# Patient Record
Sex: Female | Born: 1965 | Race: White | Hispanic: No | Marital: Married | Smoking: Former smoker
Health system: Southern US, Community
[De-identification: ages and names within clinical notes are randomized; demographics above are authoritative.]

## PROBLEM LIST (undated history)

## (undated) DIAGNOSIS — T7840XA Allergy, unspecified, initial encounter: Secondary | ICD-10-CM

## (undated) DIAGNOSIS — G43009 Migraine without aura, not intractable, without status migrainosus: Secondary | ICD-10-CM

## (undated) DIAGNOSIS — Z86010 Personal history of colonic polyps: Principal | ICD-10-CM

## (undated) DIAGNOSIS — G4700 Insomnia, unspecified: Secondary | ICD-10-CM

## (undated) DIAGNOSIS — D649 Anemia, unspecified: Secondary | ICD-10-CM

## (undated) DIAGNOSIS — N259 Disorder resulting from impaired renal tubular function, unspecified: Secondary | ICD-10-CM

## (undated) DIAGNOSIS — K644 Residual hemorrhoidal skin tags: Secondary | ICD-10-CM

## (undated) DIAGNOSIS — G43909 Migraine, unspecified, not intractable, without status migrainosus: Secondary | ICD-10-CM

## (undated) DIAGNOSIS — M1990 Unspecified osteoarthritis, unspecified site: Secondary | ICD-10-CM

## (undated) DIAGNOSIS — H269 Unspecified cataract: Secondary | ICD-10-CM

## (undated) DIAGNOSIS — E785 Hyperlipidemia, unspecified: Secondary | ICD-10-CM

## (undated) DIAGNOSIS — E739 Lactose intolerance, unspecified: Secondary | ICD-10-CM

## (undated) DIAGNOSIS — I10 Essential (primary) hypertension: Secondary | ICD-10-CM

## (undated) DIAGNOSIS — E119 Type 2 diabetes mellitus without complications: Secondary | ICD-10-CM

## (undated) DIAGNOSIS — J309 Allergic rhinitis, unspecified: Secondary | ICD-10-CM

## (undated) DIAGNOSIS — F419 Anxiety disorder, unspecified: Secondary | ICD-10-CM

## (undated) DIAGNOSIS — E669 Obesity, unspecified: Secondary | ICD-10-CM

## (undated) HISTORY — DX: Disorder resulting from impaired renal tubular function, unspecified: N25.9

## (undated) HISTORY — DX: Personal history of colonic polyps: Z86.010

## (undated) HISTORY — DX: Allergic rhinitis, unspecified: J30.9

## (undated) HISTORY — DX: Obesity, unspecified: E66.9

## (undated) HISTORY — DX: Anxiety disorder, unspecified: F41.9

## (undated) HISTORY — DX: Allergy, unspecified, initial encounter: T78.40XA

## (undated) HISTORY — DX: Hypercalcemia: E83.52

## (undated) HISTORY — PX: OTHER SURGICAL HISTORY: SHX169

## (undated) HISTORY — DX: Migraine without aura, not intractable, without status migrainosus: G43.009

## (undated) HISTORY — DX: Type 2 diabetes mellitus without complications: E11.9

## (undated) HISTORY — DX: Hyperlipidemia, unspecified: E78.5

## (undated) HISTORY — DX: Insomnia, unspecified: G47.00

## (undated) HISTORY — DX: Residual hemorrhoidal skin tags: K64.4

## (undated) HISTORY — DX: Unspecified cataract: H26.9

## (undated) HISTORY — DX: Essential (primary) hypertension: I10

## (undated) HISTORY — DX: Lactose intolerance, unspecified: E73.9

---

## 1996-05-09 HISTORY — PX: BREAST SURGERY: SHX581

## 1999-01-07 LAB — HM MAMMOGRAPHY: HM Mammogram: NORMAL

## 2006-04-23 ENCOUNTER — Inpatient Hospital Stay (HOSPITAL_COMMUNITY): Admission: AD | Admit: 2006-04-23 | Discharge: 2006-04-23 | Payer: Self-pay | Admitting: Obstetrics and Gynecology

## 2006-06-26 ENCOUNTER — Inpatient Hospital Stay (HOSPITAL_COMMUNITY): Admission: AD | Admit: 2006-06-26 | Discharge: 2006-06-26 | Payer: Self-pay | Admitting: Obstetrics and Gynecology

## 2006-07-13 ENCOUNTER — Encounter: Admission: RE | Admit: 2006-07-13 | Discharge: 2006-07-13 | Payer: Self-pay | Admitting: Obstetrics and Gynecology

## 2006-08-31 ENCOUNTER — Ambulatory Visit (HOSPITAL_COMMUNITY): Admission: RE | Admit: 2006-08-31 | Discharge: 2006-08-31 | Payer: Self-pay | Admitting: Obstetrics and Gynecology

## 2006-09-01 ENCOUNTER — Inpatient Hospital Stay (HOSPITAL_COMMUNITY): Admission: AD | Admit: 2006-09-01 | Discharge: 2006-09-01 | Payer: Self-pay | Admitting: Obstetrics and Gynecology

## 2006-09-05 ENCOUNTER — Encounter (INDEPENDENT_AMBULATORY_CARE_PROVIDER_SITE_OTHER): Payer: Self-pay | Admitting: Specialist

## 2006-09-05 ENCOUNTER — Inpatient Hospital Stay (HOSPITAL_COMMUNITY): Admission: RE | Admit: 2006-09-05 | Discharge: 2006-09-08 | Payer: Self-pay | Admitting: Obstetrics and Gynecology

## 2006-09-07 HISTORY — PX: OTHER SURGICAL HISTORY: SHX169

## 2006-09-26 ENCOUNTER — Emergency Department (HOSPITAL_COMMUNITY): Admission: EM | Admit: 2006-09-26 | Discharge: 2006-09-26 | Payer: Self-pay | Admitting: Emergency Medicine

## 2006-09-27 ENCOUNTER — Encounter: Payer: Self-pay | Admitting: Neurology

## 2006-09-27 ENCOUNTER — Inpatient Hospital Stay (HOSPITAL_COMMUNITY): Admission: RE | Admit: 2006-09-27 | Discharge: 2006-10-01 | Payer: Self-pay | Admitting: Obstetrics & Gynecology

## 2006-10-02 ENCOUNTER — Emergency Department (HOSPITAL_COMMUNITY): Admission: EM | Admit: 2006-10-02 | Discharge: 2006-10-02 | Payer: Self-pay | Admitting: Emergency Medicine

## 2007-01-04 ENCOUNTER — Encounter: Admission: RE | Admit: 2007-01-04 | Discharge: 2007-01-04 | Payer: Self-pay | Admitting: Nephrology

## 2007-01-04 ENCOUNTER — Encounter: Payer: Self-pay | Admitting: Internal Medicine

## 2007-03-16 ENCOUNTER — Encounter: Admission: RE | Admit: 2007-03-16 | Discharge: 2007-03-16 | Payer: Self-pay | Admitting: Internal Medicine

## 2007-10-25 IMAGING — CR DG CHEST 1V PORT
1 series · 1 of 1 positions shown · non-contrast
Comparison: none

CLINICAL DATA: Shortness of breath.  Postpartum.
 PORTABLE CHEST ? 1 VIEW ? 10/02/06:

[view not recorded]
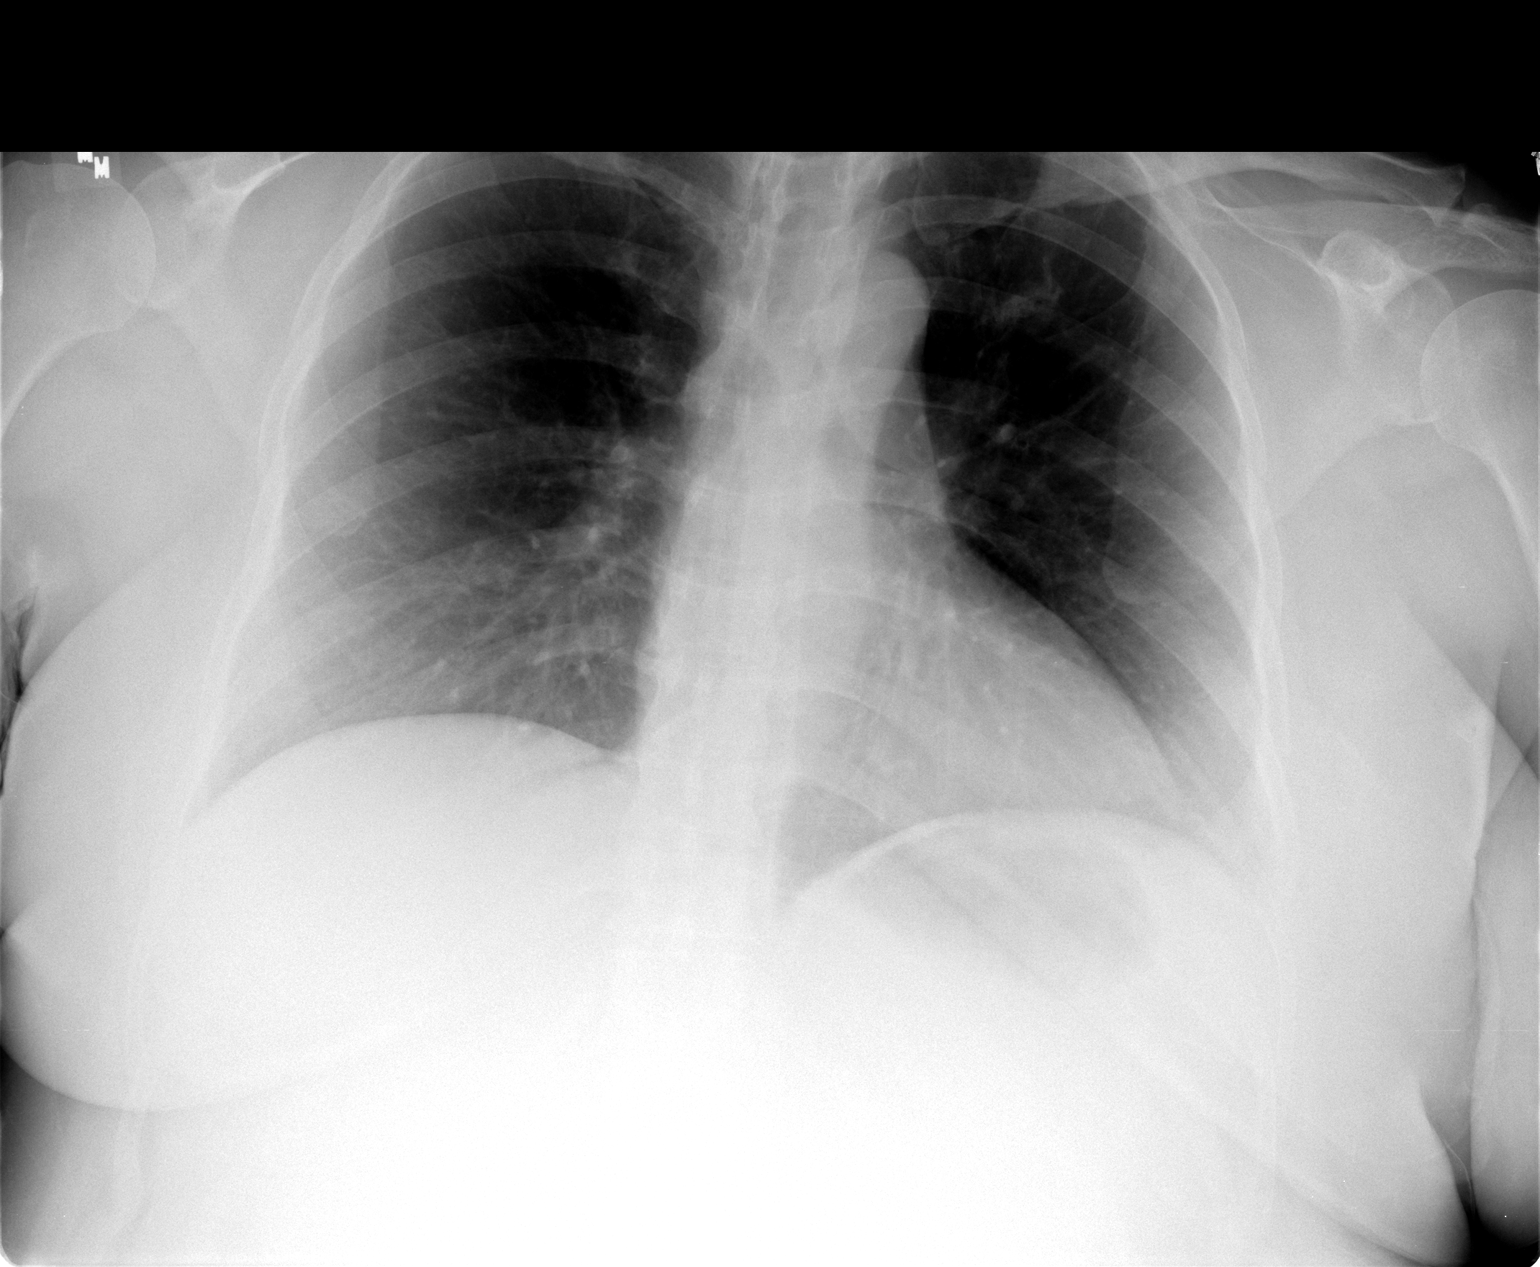

[1 of 1 positions shown; findings below may reference images not displayed]

FINDINGS: Film obtained during low inspiration.  There is no pneumothorax.  Negative for edema or infiltrates.  Density left upper lung, probable overlap of osseous and vascular structures.  Heart is normal in size.  Mediastinum and hila are negative for adenopathy.
IMPRESSION: Low lung volumes without acute cardiac or pulmonary process.

## 2007-11-20 ENCOUNTER — Encounter: Payer: Self-pay | Admitting: Internal Medicine

## 2008-04-17 ENCOUNTER — Ambulatory Visit: Payer: Self-pay | Admitting: *Deleted

## 2008-04-17 DIAGNOSIS — E1169 Type 2 diabetes mellitus with other specified complication: Secondary | ICD-10-CM | POA: Insufficient documentation

## 2008-04-17 DIAGNOSIS — E785 Hyperlipidemia, unspecified: Secondary | ICD-10-CM | POA: Insufficient documentation

## 2008-04-17 DIAGNOSIS — J309 Allergic rhinitis, unspecified: Secondary | ICD-10-CM | POA: Insufficient documentation

## 2008-04-17 DIAGNOSIS — I10 Essential (primary) hypertension: Secondary | ICD-10-CM | POA: Insufficient documentation

## 2008-04-17 DIAGNOSIS — E1159 Type 2 diabetes mellitus with other circulatory complications: Secondary | ICD-10-CM | POA: Insufficient documentation

## 2008-04-17 DIAGNOSIS — G43011 Migraine without aura, intractable, with status migrainosus: Secondary | ICD-10-CM | POA: Insufficient documentation

## 2008-04-17 DIAGNOSIS — G43009 Migraine without aura, not intractable, without status migrainosus: Secondary | ICD-10-CM

## 2008-04-17 DIAGNOSIS — J209 Acute bronchitis, unspecified: Secondary | ICD-10-CM | POA: Insufficient documentation

## 2008-05-09 LAB — CONVERTED CEMR LAB

## 2008-05-19 ENCOUNTER — Ambulatory Visit: Payer: Self-pay | Admitting: *Deleted

## 2008-05-19 DIAGNOSIS — E669 Obesity, unspecified: Secondary | ICD-10-CM | POA: Insufficient documentation

## 2008-05-19 DIAGNOSIS — E739 Lactose intolerance, unspecified: Secondary | ICD-10-CM | POA: Insufficient documentation

## 2008-05-19 DIAGNOSIS — N189 Chronic kidney disease, unspecified: Secondary | ICD-10-CM | POA: Insufficient documentation

## 2008-05-19 LAB — CONVERTED CEMR LAB
ALT: 22 units/L (ref 0–35)
Albumin: 4 g/dL (ref 3.5–5.2)
Alkaline Phosphatase: 45 units/L (ref 39–117)
Basophils Absolute: 0.1 10*3/uL (ref 0.0–0.1)
CO2: 29 meq/L (ref 19–32)
Cholesterol: 156 mg/dL (ref 0–200)
GFR calc Af Amer: 58 mL/min
HDL: 33.5 mg/dL — ABNORMAL LOW (ref >39.0)
Hemoglobin: 14.2 g/dL (ref 12.0–15.0)
LDL Cholesterol: 93 mg/dL (ref 0–99)
Lymphocytes Relative: 27.4 % (ref 12.0–46.0)
MCHC: 34.8 g/dL (ref 30.0–36.0)
Monocytes Relative: 11 % (ref 3.0–12.0)
Neutro Abs: 3.4 10*3/uL (ref 1.4–7.7)
Platelets: 222 10*3/uL (ref 150–400)
Potassium: 3.7 meq/L (ref 3.5–5.1)
RDW: 12 % (ref 11.5–14.6)
Sodium: 137 meq/L (ref 135–145)
Total Bilirubin: 0.8 mg/dL (ref 0.3–1.2)
Total CHOL/HDL Ratio: 4.7
Total Protein: 7.2 g/dL (ref 6.0–8.3)
Triglycerides: 148 mg/dL (ref 0–149)
VLDL: 30 mg/dL (ref 0–40)

## 2008-05-27 ENCOUNTER — Encounter (INDEPENDENT_AMBULATORY_CARE_PROVIDER_SITE_OTHER): Payer: Self-pay | Admitting: *Deleted

## 2008-07-11 ENCOUNTER — Telehealth (INDEPENDENT_AMBULATORY_CARE_PROVIDER_SITE_OTHER): Payer: Self-pay | Admitting: *Deleted

## 2008-07-17 ENCOUNTER — Ambulatory Visit: Payer: Self-pay | Admitting: *Deleted

## 2008-08-19 ENCOUNTER — Ambulatory Visit: Payer: Self-pay | Admitting: Family Medicine

## 2008-09-02 ENCOUNTER — Ambulatory Visit: Payer: Self-pay | Admitting: Family Medicine

## 2008-09-02 DIAGNOSIS — T887XXA Unspecified adverse effect of drug or medicament, initial encounter: Secondary | ICD-10-CM | POA: Insufficient documentation

## 2008-09-12 ENCOUNTER — Telehealth (INDEPENDENT_AMBULATORY_CARE_PROVIDER_SITE_OTHER): Payer: Self-pay | Admitting: *Deleted

## 2008-09-16 ENCOUNTER — Ambulatory Visit: Payer: Self-pay | Admitting: Family Medicine

## 2008-09-16 ENCOUNTER — Telehealth: Payer: Self-pay | Admitting: Family Medicine

## 2008-09-16 LAB — CONVERTED CEMR LAB
BUN: 19 mg/dL (ref 6–23)
Chloride: 102 meq/L (ref 96–112)
Creatinine, Ser: 1.3 mg/dL — ABNORMAL HIGH (ref 0.4–1.2)
Glucose, Bld: 92 mg/dL (ref 70–99)
Potassium: 3.8 meq/L (ref 3.5–5.1)

## 2008-11-25 ENCOUNTER — Ambulatory Visit: Payer: Self-pay | Admitting: Family Medicine

## 2008-12-08 ENCOUNTER — Telehealth (INDEPENDENT_AMBULATORY_CARE_PROVIDER_SITE_OTHER): Payer: Self-pay | Admitting: *Deleted

## 2009-06-02 ENCOUNTER — Ambulatory Visit: Payer: Self-pay | Admitting: Family

## 2009-06-02 DIAGNOSIS — G4700 Insomnia, unspecified: Secondary | ICD-10-CM | POA: Insufficient documentation

## 2009-06-03 ENCOUNTER — Ambulatory Visit: Payer: Self-pay | Admitting: Family

## 2009-06-03 LAB — CONVERTED CEMR LAB
Albumin: 4.4 g/dL (ref 3.5–5.2)
CO2: 29 meq/L (ref 19–32)
Calcium: 9.5 mg/dL (ref 8.4–10.5)
Cholesterol: 163 mg/dL (ref 0–200)
Creatinine, Ser: 1.2 mg/dL (ref 0.4–1.2)
Glucose, Bld: 73 mg/dL (ref 70–99)
HDL: 41.4 mg/dL (ref >39.00)
TSH: 2 microintl units/mL (ref 0.35–5.50)
Total Protein: 7.4 g/dL (ref 6.0–8.3)
Triglycerides: 198 mg/dL — ABNORMAL HIGH (ref 0.0–149.0)

## 2009-06-12 ENCOUNTER — Encounter: Payer: Self-pay | Admitting: Family

## 2009-07-22 ENCOUNTER — Ambulatory Visit: Payer: Self-pay | Admitting: Family

## 2009-07-22 LAB — CONVERTED CEMR LAB
BUN: 10 mg/dL (ref 6–23)
CO2: 31 meq/L (ref 19–32)
Chloride: 106 meq/L (ref 96–112)
Glucose, Bld: 98 mg/dL (ref 70–99)
Potassium: 3.8 meq/L (ref 3.5–5.1)

## 2009-11-24 ENCOUNTER — Encounter: Payer: Self-pay | Admitting: Internal Medicine

## 2009-11-25 ENCOUNTER — Ambulatory Visit: Payer: Self-pay | Admitting: Internal Medicine

## 2009-12-04 ENCOUNTER — Ambulatory Visit (HOSPITAL_COMMUNITY): Admission: RE | Admit: 2009-12-04 | Discharge: 2009-12-04 | Payer: Self-pay | Admitting: Obstetrics and Gynecology

## 2009-12-07 DEATH — deceased

## 2010-02-24 ENCOUNTER — Ambulatory Visit: Payer: Self-pay | Admitting: Internal Medicine

## 2010-02-24 DIAGNOSIS — L03119 Cellulitis of unspecified part of limb: Secondary | ICD-10-CM

## 2010-02-24 DIAGNOSIS — L02419 Cutaneous abscess of limb, unspecified: Secondary | ICD-10-CM | POA: Insufficient documentation

## 2010-02-25 LAB — CONVERTED CEMR LAB
HDL: 36.6 mg/dL — ABNORMAL LOW (ref >39.00)
VLDL: 45.8 mg/dL — ABNORMAL HIGH (ref 0.0–40.0)

## 2010-06-08 NOTE — Assessment & Plan Note (Signed)
Summary: one mth fu/kdc   Vital Signs:  Patient profile:   45 year old female Weight:      204 pounds BMI:     35.14 O2 Sat:      94 % on Room air Temp:     98 degrees F oral Pulse rate:   78 / minute Pulse rhythm:   regular Resp:     16 per minute BP sitting:   100 / 76  (left arm) Cuff size:   large  Vitals Entered By: Mervin Kung CMA (July 22, 2009 8:28 AM)  O2 Flow:  Room air CC: ROOM 17  1 month follow up Is Patient Diabetic? No Comments Needs refills on all prescriptions.   Primary Care Provider:  Seymour Bars DO  CC:  ROOM 17  1 month follow up.  History of Present Illness: [name] is a 45 yr old female who presents today for follow up of her HTN and Hyperlipidemia. Last visit her norvasc was held.  Patient was monitoring BP at home and noted that her diastolic BP was staying in the 90's.  She decided to resume Norvasc about 2.5 weeks ago.  Tells me that she feels, "a lot better when I am on it (norvasc)."  Denies LE swelling or headache.    Preventive Screening-Counseling & Management  Alcohol-Tobacco     Smoking Status: quit  Allergies: 1)  ! Avapro (Irbesartan) 2)  ! Benicar (Olmesartan Medoxomil) 3)  Lisinopril  Physical Exam  General:  Well-developed,well-nourished,in no acute distress; alert,appropriate and cooperative throughout examination Lungs:  Normal respiratory effort, chest expands symmetrically. Lungs are clear to auscultation, no crackles or wheezes. Heart:  Normal rate and regular rhythm. S1 and S2 normal without gallop, murmur, click, rub or other extra sounds.   Impression & Recommendations:  Problem # 1:  HYPERTENSION (ICD-401.9) Assessment New BP stable, back on norvasc, continue same.  Check BMET. Her updated medication list for this problem includes:    Labetalol Hcl 200 Mg Tabs (Labetalol hcl) .Marland Kitchen..Marland Kitchen Two  tablet three times daily    Triamterene-hctz 37.5-25 Mg Caps (Triamterene-hctz) .Marland Kitchen... Take 1 tablet by mouth once a  day    Norvasc 5 Mg Tabs (Amlodipine besylate) .Marland Kitchen... Take 1 tablet by mouth once a day  Orders: TLB-BMP (Basic Metabolic Panel-BMET) (80048-METABOL)  Problem # 2:  HYPERLIPIDEMIA (ICD-272.4) Assessment: Comment Only Last visit triglycerides were elevated,  continue tricor, advised patient on diet.   Her updated medication list for this problem includes:    Tricor 145 Mg Tabs (Fenofibrate) .Marland Kitchen... Take 1 tablet by mouth once a day    Lipitor 20 Mg Tabs (Atorvastatin calcium) .Marland Kitchen... Take 1 tablet by mouth once a day  Complete Medication List: 1)  Labetalol Hcl 200 Mg Tabs (Labetalol hcl) .... Two  tablet three times daily 2)  Tricor 145 Mg Tabs (Fenofibrate) .... Take 1 tablet by mouth once a day 3)  Lipitor 20 Mg Tabs (Atorvastatin calcium) .... Take 1 tablet by mouth once a day 4)  Triamterene-hctz 37.5-25 Mg Caps (Triamterene-hctz) .... Take 1 tablet by mouth once a day 5)  Centrum Tabs (Multiple vitamins-minerals) .... Take 1 tablet by mouth once a day 6)  Alavert 10 Mg Tabs (Loratadine) .... One tab by mouth once daily as needed 7)  Aler-tab 25 Mg Tabs (Diphenhydramine hcl) .... Take 1 tablet by mouth once a day as needed 8)  Arthritis Pain Relief 650 Mg Cr-tabs (Acetaminophen) .... Take 1 tablet by mouth once a  day as needed 9)  Fish Oil 1000 Mg Caps (Omega-3 fatty acids) .... Take 1 tablet by mouth once a day 10)  Alph-e 400 Unit Caps (Vitamin e) .... Take 1 tablet by mouth once a day 11)  Vitamin C 1000 Mg Tabs (Ascorbic acid) .... Take 1 tablet by mouth once a day 12)  Calcium 600/vitamin D 600-400 Mg-unit Chew (Calcium carbonate-vitamin d) .... Take 1 tablet by mouth daily 13)  Amitriptyline Hcl 25 Mg Tabs (Amitriptyline hcl) .... One tablet by mouth at bedtime as needed insomnia 14)  Norvasc 5 Mg Tabs (Amlodipine besylate) .... Take 1 tablet by mouth once a day  Patient Instructions: 1)  Your triglycerides are high.  Please make the following nutritional changes: 2)  1.  Avoid  white bread, white pasta and white rice 3)  2.  Avoid high fructose corn syrup 4)  3.  Instead eat brown carbs- Yams, Wheat pasta, whole grained breads, wild rice. 5)  Limit your sugars to less than 40 grams a day from labeled foods and drinks. 6)  Return in 3 months, sooner if problems or concerns. Prescriptions: NORVASC 5 MG TABS (AMLODIPINE BESYLATE) Take 1 tablet by mouth once a day  #30 x 2   Entered by:   Mervin Kung CMA   Authorized by:   Lemont Fillers FNP   Signed by:   Lemont Fillers FNP on 07/22/2009   Method used:   [facility]        [facility] [address] [phone]* (retail)       [address]       [place], [place]  [address]       Ph: [phone] or [phone]       Fax: [phone]   RxID:   [id number] AMITRIPTYLINE HCL 25 MG TABS (AMITRIPTYLINE HCL) one tablet by mouth at bedtime as needed insomnia  #30 x 2   Entered by:   Mervin Kung CMA   Authorized by:   Lemont Fillers FNP   Signed by:   Lemont Fillers FNP on 07/22/2009   Method used:   [facility]        [facility] [address] [phone]* (retail)       [address]       [place], [place]  [address]       Ph: [phone] or [phone]       Fax: [phone]   RxID:   [id number] TRIAMTERENE-HCTZ 37.5-25 MG CAPS (TRIAMTERENE-HCTZ) Take 1 tablet by mouth once a day  #30 x 2   Entered by:   Mervin Kung CMA   Authorized by:   Lemont Fillers FNP   Signed by:   Lemont Fillers FNP on 07/22/2009   Method used:   [facility]ronically to        [facility] [address] [phone]* (retail)       [address]       [place], [place]  [address]       Ph: [phone] or [phone]       Fax: [phone]   RxID:   [id number] LIPITOR 20 MG TABS (ATORVASTATIN CALCIUM) Take 1 tablet by mouth once a day  #30 x 2   Entered by:   Mervin Kung CMA   Authorized by:   Lemont Fillers  FNP  Signed by:   Lemont Fillers FNP on 07/22/2009   Method used:   [facility]ronically to        [facility] [address] [phone]* (retail)       [address]       [place], [place]  [address]       Ph: [phone] or [phone]       Fax: [phone]   RxID:   [id number] TRICOR 145 MG TABS (FENOFIBRATE) Take 1 tablet by mouth once a day  #30 x 2   Entered by:   Mervin Kung CMA   Authorized by:   Lemont Fillers FNP   Signed by:   Lemont Fillers FNP on 07/22/2009   Method used:   [facility]ronically to        [facility] [address] [phone]* (retail)       [address]       [place], [place]  [address]       Ph: [phone] or [phone]       Fax: [phone]   RxID:   [id number] LABETALOL HCL 200 MG TABS (LABETALOL HCL) two  tablet three times daily  #180 x 2   Entered by:   Mervin Kung CMA   Authorized by:   Lemont Fillers FNP   Signed by:   Lemont Fillers FNP on 07/22/2009   Method used:   [facility]ronically to        [facility] [address] [phone]* (retail)       [address]       [place], [place]  [address]       Ph: [phone] or [phone]       Fax: [phone]   RxID:   [id number]   Current Allergies (reviewed today): ! AVAPRO (IRBESARTAN) ! BENICAR (OLMESARTAN MEDOXOMIL) LISINOPRIL

## 2010-06-08 NOTE — Assessment & Plan Note (Signed)
Summary: F/U/HEA   Vital Signs:  Patient profile:   45 year old female Height:      64 inches (162.56 cm) Weight:      199.13 pounds (90.51 kg) Pulse rate:   78 / minute BP sitting:   100 / 72  Vitals Entered By: [name] (June 02, 2009 8:11 AM) CC: 6 mos followup refill on meds   Primary Care Provider:  Seymour Bars DO  CC:  6 mos followup refill on meds.  History of Present Illness: [name] is a 45 year old female who presents today for a 6 month follow up.  Has been taking medicines as prescribed.  Patient notes that she had a miscarraige 2 weeks ago at [redacted] weeks gestation which she has been upset about.  Also- notes intermittent insomnia, which has been worse since recent event.  Has tried benadryl without improvement.    Allergies: 1)  ! Avapro (Irbesartan) 2)  ! Benicar (Olmesartan Medoxomil) 3)  Lisinopril  Review of Systems       notes + weight gain due to recent miscarraige,  + insomnia, denies anxiety or depression.    Physical Exam  General:  Well-developed,well-nourished,in no acute distress; alert,appropriate and cooperative throughout examination Head:  Normocephalic and atraumatic without obvious abnormalities. No apparent alopecia or balding. Lungs:  Normal respiratory effort, chest expands symmetrically. Lungs are clear to auscultation, no crackles or wheezes. Heart:  Normal rate and regular rhythm. S1 and S2 normal without gallop, murmur, click, rub or other extra sounds.   Impression & Recommendations:  Problem # 1:  HYPERTENSION (ICD-401.9) Assessment Comment Only  Patient appears to be overtreated at this time, will hold norvasc- patient instructed to check BP at home, parameters provided for patient to call.   The following medications were removed from the medication list:    Norvasc 5 Mg Tabs (Amlodipine besylate) .Marland Kitchen... 1 tab by mouth daily Her updated medication list for this problem includes:    Labetalol Hcl 200 Mg Tabs (Labetalol  hcl) .Marland Kitchen..Marland Kitchen Two  tablet three times daily    Triamterene-hctz 37.5-25 Mg Caps (Triamterene-hctz) .Marland Kitchen... Take 1 tablet by mouth once a day  BP today: 100/72 Prior BP: 116/82 (11/25/2008)  Prior 10 Yr Risk Heart Disease: 2 % (08/19/2008)  Labs Reviewed: K+: 3.8 (09/16/2008) Creat: : 1.3 (09/16/2008)   Chol: 156 (05/19/2008)   HDL: 33.5 (05/19/2008)   LDL: 93 (05/19/2008)   TG: 148 (05/19/2008)  Problem # 2:  HYPERLIPIDEMIA (ICD-272.4) Will check FLP, LFT's- patient hopes to be able to come off of these medicines Her updated medication list for this problem includes:    Tricor 145 Mg Tabs (Fenofibrate) .Marland Kitchen... Take 1 tablet by mouth once a day    Lipitor 20 Mg Tabs (Atorvastatin calcium) .Marland Kitchen... Take 1 tablet by mouth once a day  Problem # 3:  INSOMNIA (ICD-780.52) Will give trial of elevil, patient is trying to avoid ambien.    Problem # 4:  GLUCOSE INTOLERANCE (ICD-271.3) Assessment: Comment Only Will check fasing glucose and A1C  Complete Medication List: 1)  Labetalol Hcl 200 Mg Tabs (Labetalol hcl) .... Two  tablet three times daily 2)  Tricor 145 Mg Tabs (Fenofibrate) .... Take 1 tablet by mouth once a day 3)  Lipitor 20 Mg Tabs (Atorvastatin calcium) .... Take 1 tablet by mouth once a day 4)  Triamterene-hctz 37.5-25 Mg Caps (Triamterene-hctz) .... Take 1 tablet by mouth once a day 5)  Centrum Tabs (Multiple vitamins-minerals) .... Take  1 tablet by mouth once a day 6)  Alavert 10 Mg Tabs (Loratadine) .... One tab by mouth once daily as needed 7)  Aler-tab 25 Mg Tabs (Diphenhydramine hcl) .... Take 1 tablet by mouth once a day as needed 8)  Arthritis Pain Relief 650 Mg Cr-tabs (Acetaminophen) .... Take 1 tablet by mouth once a day as needed 9)  Fish Oil 1000 Mg Caps (Omega-3 fatty acids) .... Take 1 tablet by mouth once a day 10)  Alph-e 400 Unit Caps (Vitamin e) .... Take 1 tablet by mouth once a day 11)  Vitamin C 1000 Mg Tabs (Ascorbic acid) .... Take 1 tablet by mouth once a  day 12)  Calcium 600/vitamin D 600-400 Mg-unit Chew (Calcium carbonate-vitamin d) .... Take 1 tablet by mouth two times a day 13)  Amitriptyline Hcl 25 Mg Tabs (Amitriptyline hcl) .... One tablet by mouth by mouth at bedtime as needed insomnia  Patient Instructions: 1)  Please return fasting for the following labs: 2)  BMET (401.9) 3)  LFT's, FLP(272.4) 4)  TSH (278) 5)  A1C (271.3) 6)  Please check your blood pressure at home.  Call if your blood pressure is greater than 150/90 or less than 100/60. 7)  Follow up in 1 month. 8)  Follow up with GYN for your Pap and Mammogram. 9)  It was a pleasure to meet you 10)    Prescriptions: TRIAMTERENE-HCTZ 37.5-25 MG CAPS (TRIAMTERENE-HCTZ) Take 1 tablet by mouth once a day  #30 x 2   Entered and Authorized by:   Lemont Fillers FNP   Signed by:   Lemont Fillers FNP on 06/02/2009   Method used:   [facility]ron[facility] to        [facility] [address] [phone]* (retail)       [address]       [place], [place]  [address]       Ph: [phone] or [phone]       Fax: [phone]   RxID:   [id number] LIPITOR 20 MG TABS (ATORVASTATIN CALCIUM) Take 1 tablet by mouth once a day  #30 x 2   Entered and Authorized by:   Lemont Fillers FNP   Signed by:   Lemont Fillers FNP on 06/02/2009   Method used:   Electronically to        [facility] [address] [phone]* (retail)       [address]       [place], [place]  [address]       Ph: [phone] or [phone]       Fax: [phone]   RxID:   [id number] TRICOR 145 MG TABS (FENOFIBRATE) Take 1 tablet by mouth once a day  #30 x 2   Entered and Authorized by:   Lemont Fillers FNP   Signed by:   Lemont Fillers FNP on 06/02/2009   Method used:   Electronically to        [facility] [address] [phone]* (retail)       [address]       [place], [place]  [address]       Ph: [phone] or  [phone]       Fax: [phone]   RxID:   [id number] LABETALOL HCL 200 MG TABS (LABETALOL HCL) two  tablet three times daily  #  180 x 2   Entered and Authorized by:   Lemont Fillers FNP   Signed by:   Lemont Fillers FNP on 06/02/2009   Method used:   Electronically to        [facility] [address] [phone]* (retail)       [address]       [place], [place]  [address]       Ph: [phone] or [phone]       Fax: [phone]   RxID:   [id number] AMITRIPTYLINE HCL 25 MG TABS (AMITRIPTYLINE HCL) one tablet by mouth by mouth at bedtime as needed insomnia  #30 x 0   Entered and Authorized by:   Lemont Fillers FNP   Signed by:   Lemont Fillers FNP on 06/02/2009   Method used:   Electronically to        [facility] [address] [phone]* (retail)       [address]       [place], [place]  [address]       Ph: [phone] or [phone]       Fax: [phone]   RxID:   [id number]

## 2010-06-08 NOTE — Letter (Signed)
Summary: Date Range: 10-11-06 to 11-24-09/[facility]  Date Range: 10-11-06 to 11-24-09/[facility]   Imported By: Sherian Rein 12/02/2009 10:19:16  _____________________________________________________________________  External Attachment:    Type:   Image     Comment:   External Document

## 2010-06-08 NOTE — Assessment & Plan Note (Signed)
Summary: 3-6 mth fu---stc   Vital Signs:  Patient profile:   45 year old female Height:      64 inches (162.56 cm) Weight:      208.0 pounds (94.55 kg) O2 Sat:      97 % on Room air Temp:     97.7 degrees F (36.50 degrees C) oral Pulse rate:   79 / minute BP sitting:   110 / 76  (left arm) Cuff size:   large  Vitals Entered By: Orlan Leavens RMA (February 24, 2010 9:10 AM)  O2 Flow:  Room air CC: 3 month follow-up Is Patient Diabetic? No Pain Assessment Patient in pain? no        Primary Care Provider:  Newt Lukes MD  CC:  3 month follow-up.  History of Present Illness: here for followup -  1) HTN - has been on labetolol with diuretic and norvasc - hx severe HTN in past, no secondary cause identified - follows with renal for same - reports compliance with ongoing medical treatment and no changes in medication dose or frequency. denies adverse side effects related to current therapy.   2) dyslipidemia - resumed lipitor 12/2009 after miscarriage - reports compliance with ongoing medical treatment - no changes in medication dose or frequency. denies adverse side effects related to current therapy.   3) glucose intolerance with hx gestational DM 2008 -not on meds for same since childbirth 2008 - follows low carb diet and exercise in effort to control weight - +FH same -  Clinical Review Panels:  Immunizations   Last Tetanus Booster:  Tdap (05/19/2008)  Lipid Management   Cholesterol:  163 (06/03/2009)   LDL (bad choesterol):  82 (06/03/2009)   HDL (good cholesterol):  41.40 (06/03/2009)  Diabetes Management   HgBA1C:  5.6 (06/03/2009)   Creatinine:  1.2 (07/22/2009)  CBC   WBC:  5.9 (05/19/2008)   RBC:  4.46 (05/19/2008)   Hgb:  14.2 (05/19/2008)   Hct:  40.9 (05/19/2008)   Platelets:  222 (05/19/2008)   MCV  91.6 (05/19/2008)   MCHC  34.8 (05/19/2008)   RDW  12.0 (05/19/2008)   PMN:  57.5 (05/19/2008)   Lymphs:  27.4 (05/19/2008)   Monos:  11.0  (05/19/2008)   Eosinophils:  3.2 (05/19/2008)   Basophil:  0.9 (05/19/2008)  Complete Metabolic Panel   Glucose:  98 (07/22/2009)   Sodium:  141 (07/22/2009)   Potassium:  3.8 (07/22/2009)   Chloride:  106 (07/22/2009)   CO2:  31 (07/22/2009)   BUN:  10 (07/22/2009)   Creatinine:  1.2 (07/22/2009)   Albumin:  4.4 (06/03/2009)   Total Protein:  7.4 (06/03/2009)   Calcium:  9.5 (07/22/2009)   Total Bili:  0.8 (06/03/2009)   Alk Phos:  45 (06/03/2009)   SGPT (ALT):  24 (06/03/2009)   SGOT (AST):  24 (06/03/2009)   Current Medications (verified): 1)  Labetalol Hcl 200 Mg Tabs (Labetalol Hcl) .... Two  Tablet Three Times Daily 2)  Tricor 145 Mg Tabs (Fenofibrate) .... Take 1 Tablet By Mouth Once A Day 3)  Lipitor 20 Mg Tabs (Atorvastatin Calcium) .... Take 1 Tablet By Mouth Once A Day 4)  Triamterene-Hctz 37.5-25 Mg Caps (Triamterene-Hctz) .... Take 1 Tablet By Mouth Once A Day 5)  Centrum  Tabs (Multiple Vitamins-Minerals) .... Take 1 Tablet By Mouth Once A Day 6)  Alavert 10 Mg Tabs (Loratadine) .... One Tab By Mouth Once Daily As Needed 7)  Aler-Tab 25 Mg Tabs (Diphenhydramine  Hcl) .... Take 1 Tablet By Mouth Once A Day As Needed 8)  Arthritis Pain Relief 650 Mg Cr-Tabs (Acetaminophen) .... Take 1 Tablet By Mouth Once A Day As Needed 9)  Fish Oil 1000 Mg Caps (Omega-3 Fatty Acids) .... Take 1 Tablet By Mouth Once A Day 10)  Alph-E 400 Unit Caps (Vitamin E) .... Take 1 Tablet By Mouth Once A Day 11)  Vitamin C 1000 Mg Tabs (Ascorbic Acid) .... Take 1 Tablet By Mouth Once A Day 12)  Calcium 600/vitamin D 600-400 Mg-Unit Chew (Calcium Carbonate-Vitamin D) .... Take 1 Tablet By Mouth Daily 13)  Amitriptyline Hcl 25 Mg Tabs (Amitriptyline Hcl) .... One Tablet By Mouth At Bedtime As Needed Insomnia 14)  Norvasc 5 Mg Tabs (Amlodipine Besylate) .... Take 1 Tablet By Mouth Once A Day  Allergies (verified): 1)  ! Avapro (Irbesartan) 2)  ! Benicar (Olmesartan Medoxomil) 3)   Lisinopril  Past History:  Past Medical History: gestational diabetes hx - 2008 frequent headaches/migraines Hyperlipidemia Hypertension  Allergic rhinitis renal insuffic  MD roster:  renal - [facility] - [facility]  Review of Systems  The patient denies chest pain, syncope, peripheral edema, and headaches.         c/o sore "bite" with draiange x 5 days on back of left lower calf, no fever  Physical Exam  General:  alert, well-developed, well-nourished, and cooperative to examination.  mild-mod overweight-appearing.   Lungs:  normal respiratory effort, no intercostal retractions or use of accessory muscles; normal breath sounds bilaterally - no crackles and no wheezes.    Heart:  normal rate, regular rhythm, no murmur, and no rub. BLE without edema. Skin:  mild cellulitis surrounding insect bite, scabbed, no fluctuence or active drainage Psych:  Oriented X3, memory intact for recent and remote, normally interactive, good eye contact, not anxious appearing, not depressed appearing, and not agitated.      Impression & Recommendations:  Problem # 1:  HYPERLIPIDEMIA (ICD-272.4) back on lipitor since 12/2009 - check FLP now  Her updated medication list for this problem includes:    Tricor 145 Mg Tabs (Fenofibrate) .Marland Kitchen... Take 1 tablet by mouth once a day    Lipitor 20 Mg Tabs (Atorvastatin calcium) .Marland Kitchen... Take 1 tablet by mouth once a day  Orders: TLB-Lipid Panel (80061-LIPID)  Labs Reviewed: SGOT: 24 (06/03/2009)   SGPT: 24 (06/03/2009)  Prior 10 Yr Risk Heart Disease: 2 % (08/19/2008)   HDL:41.40 (06/03/2009), 33.5 (05/19/2008)  LDL:82 (06/03/2009), 93 (05/19/2008)  Chol:163 (06/03/2009), 156 (05/19/2008)  Trig:198.0 (06/03/2009), 148 (05/19/2008)  Problem # 2:  HYPERTENSION (ICD-401.9)  Her updated medication list for this problem includes:    Labetalol Hcl 200 Mg Tabs (Labetalol hcl) .Marland Kitchen..Marland Kitchen Two  tablet three times daily    Triamterene-hctz 37.5-25 Mg Caps  (Triamterene-hctz) .Marland Kitchen... Take 1 tablet by mouth once a day    Norvasc 5 Mg Tabs (Amlodipine besylate) .Marland Kitchen... Take 1 tablet by mouth once a day  hx severe dz - improved today - renal OV annually - last OV reviewed  BP today: 110/76 Prior BP: 110/78 (11/25/2009)  Prior 10 Yr Risk Heart Disease: 2 % (08/19/2008)  Labs Reviewed: K+: 3.8 (07/22/2009) Creat: : 1.2 (07/22/2009)   Chol: 163 (06/03/2009)   HDL: 41.40 (06/03/2009)   LDL: 82 (06/03/2009)   TG: 198.0 (06/03/2009)  Problem # 3:  CELLULITIS, LEG, LEFT (ICD-682.6)  small painful red area at site of probable insect bite - cover with emepric doxy - erx done Her updated  medication list for this problem includes:    Doxycycline Hyclate 100 Mg Tabs (Doxycycline hyclate) .Marland Kitchen... 1 by mouth two times a day x 7 days  Orders: Prescription Created Electronically [phone])  Elevate affected area. Warm moist compresses for 20 minutes every 2 hours while awake. Take antibiotics as directed and take acetaminophen as needed. To be seen in 48-72 hours if no improvement, sooner if worse.  Problem # 4:  INSOMNIA (ICD-780.52) chronic, not improved with low dose amitriptylibe (on same as needed since 05/2009) as pt does not use every night, will try low dose ambien as needed  Her updated medication list for this problem includes:    Ambien 5 Mg Tabs (Zolpidem tartrate) .Marland Kitchen... 1 by mouth at bedtime as needed for insomnia  Complete Medication List: 1)  Labetalol Hcl 200 Mg Tabs (Labetalol hcl) .... Two  tablet three times daily 2)  Tricor 145 Mg Tabs (Fenofibrate) .... Take 1 tablet by mouth once a day 3)  Lipitor 20 Mg Tabs (Atorvastatin calcium) .... Take 1 tablet by mouth once a day 4)  Triamterene-hctz 37.5-25 Mg Caps (Triamterene-hctz) .... Take 1 tablet by mouth once a day 5)  Centrum Tabs (Multiple vitamins-minerals) .... Take 1 tablet by mouth once a day 6)  Alavert 10 Mg Tabs (Loratadine) .... One tab by mouth once daily as needed 7)  Aler-tab  25 Mg Tabs (Diphenhydramine hcl) .... Take 1 tablet by mouth once a day as needed 8)  Arthritis Pain Relief 650 Mg Cr-tabs (Acetaminophen) .... Take 1 tablet by mouth once a day as needed 9)  Fish Oil 1000 Mg Caps (Omega-3 fatty acids) .... Take 1 tablet by mouth once a day 10)  Alph-e 400 Unit Caps (Vitamin e) .... Take 1 tablet by mouth once a day 11)  Vitamin C 1000 Mg Tabs (Ascorbic acid) .... Take 1 tablet by mouth once a day 12)  Calcium 600/vitamin D 600-400 Mg-unit Chew (Calcium carbonate-vitamin d) .... Take 1 tablet by mouth daily 13)  Amitriptyline Hcl 25 Mg Tabs (Amitriptyline hcl) .... One tablet by mouth at bedtime as needed insomnia 14)  Norvasc 5 Mg Tabs (Amlodipine besylate) .... Take 1 tablet by mouth once a day 15)  Doxycycline Hyclate 100 Mg Tabs (Doxycycline hyclate) .Marland Kitchen.. 1 by mouth two times a day x 7 days 16)  Ambien 5 Mg Tabs (Zolpidem tartrate) .Marland Kitchen.. 1 by mouth at bedtime as needed for insomnia  Patient Instructions: 1)  it was good to see you today. 2)  test(s) ordered today - your results will be posted on the phone tree for review in 48-72 hours from the time of test completion; call [phone] and enter your 9 digit MRN (listed above on this page, just below your name); if any changes need to be made or there are abnormal results, you will be contacted directly. 3)  start doxycycline for leg sore and try ambien for sleep as discussed - your prescriptions have been electronically submitted to your pharmacy. Please take as directed. Contact our office if you believe you're having problems with the medication(s).  4)  Please schedule a follow-up appointment in 6 months, call sooner if problems. will check labs including cholesterol at that visit and monitor blood pressure Prescriptions: AMBIEN 5 MG TABS (ZOLPIDEM TARTRATE) 1 by mouth at bedtime as needed for insomnia  #30 x 1   Entered and Authorized by:   Newt Lukes MD   Signed by:   Newt Lukes MD on  02/24/2010   Method used:   Printed then faxed to ...       [facility] [address] [phone]* (retail)       [address]       [place], [place]  [address]       Ph: [phone] or [phone]       Fax: [phone]   RxID:   [id number] DOXYCYCLINE HYCLATE 100 MG TABS (DOXYCYCLINE HYCLATE) 1 by mouth two times a day x 7 days  #14 x 0   Entered and Authorized by:   Newt Lukes MD   Signed by:   Newt Lukes MD on 02/24/2010   Method used:   [facility]ronically to        [facility] [address] [phone]* (retail)       [address]       [place], [place]  [address]       Ph: [phone] or [phone]       Fax: [phone]   RxID:   [id number]    Orders Added: 1)  Est. Patient Level IV [02585] 2)  TLB-Lipid Panel [80061-LIPID] 3)  Prescription Created Electronically [phone]

## 2010-06-08 NOTE — Letter (Signed)
   [place] [facility] [address] [place], [place]  [address]  [place] Phone: [phone]      June 15, 2009   [facility] [address] [place], [place] [address]  RE:  LAB RESULTS  Dear  Ms. Tipping,  The following is an interpretation of your most recent lab tests.  Please take note of any instructions provided or changes to medications that have resulted from your lab work.  ELECTROLYTES:  Good - no changes needed  LIPID PANEL:  Stable - no changes needed Triglyceride: 198.0   Cholesterol: 163   LDL: 82   HDL: 41.40   Chol/HDL%:  4  THYROID STUDIES:  Thyroid studies normal TSH: 2.00     DIABETIC STUDIES:  Excellent - no changes needed Blood Glucose: 73   HgbA1C: 5.6      Sincerely Yours,    Lemont Fillers FNP

## 2010-06-08 NOTE — Assessment & Plan Note (Signed)
Summary: NEW [employer] PT-FORMER MELISSA O SULLIVAN PT-PKG-#--STC   Vital Signs:  Patient profile:   45 year old female Height:      64 inches (162.56 cm) Weight:      202.12 pounds (91.87 kg) O2 Sat:      97 % on Room air Temp:     98.7 degrees F (37.06 degrees C) oral Pulse rate:   84 / minute BP sitting:   110 / 78  (left arm) Cuff size:   large  Vitals Entered By: Orlan Leavens (November 25, 2009 3:14 PM)  O2 Flow:  Room air CC: New patient est transferring from [facility] Patient in pain? no      Comments Pt request refill on all meds   Primary Care Provider:  Newt Lukes MD  CC:  [facility]  History of Present Illness: new pt to me but known to our practice and division, here to est care closer to her home -  1) HTN - has been on labetolol tx only for ast several weeks since dx of pregnancy -- however, told this AM pregnancy not viable - prev on labetolol with diuretic and norvasc - hx severe HTN in past, no secondary cause identified - follows with renal for same  2) dyslipidemia - prior to preganancy dx, reports compliance with ongoing medical treatment - however, off treatments due to preganancy - but now ?when to resume - no changes in medication dose or frequency. denies adverse side effects related to current therapy.   3) glucose intolerance with hx gestational DM 2008 -not on meds for same since childbirth 2008 - follows low carb diet and exercise in effort to control weight - +FH same -  Preventive Screening-Counseling & Management  Alcohol-Tobacco     Alcohol type: beer     Smoking Status: quit     Year Quit: 2007     Pack years: 15years x   1/2 pack daily     Passive Smoke Exposure: no  Caffeine-Diet-Exercise     Caffeine use/day: 3 - patient to cut back     Does Patient Exercise: yes     Type of exercise: walking     Times/week: 4     Exercise Counseling: not indicated; exercise  is adequate     Depression Counseling: not indicated; screening negative for depression  Safety-Violence-Falls     Seat Belt Use: yes     Seat Belt Counseling: not indicated; patient wears seat belts     Helmet Use: yes     Firearms in the Home: firearms in the home     Firearm Counseling: not indicated; uses recommended firearm safety measures     Smoke Detectors: yes     Violence in the Home: no risk noted     Fall Risk Counseling: not indicated; no significant falls noted  Clinical Review Panels:  Prevention   Last Mammogram:  Normal Bilateral (01/07/1999)   Last Pap Smear:  Interpretation/ Result:Negative for intraepithelial Lesion or Malignancy.    (05/09/2008)  Immunizations   Last Tetanus Booster:  Tdap (05/19/2008)  Lipid Management   Cholesterol:  163 (06/03/2009)   LDL (bad choesterol):  82 (06/03/2009)   HDL (good cholesterol):  41.40 (06/03/2009)  Diabetes Management   HgBA1C:  5.6 (06/03/2009)   Creatinine:  1.2 (07/22/2009)  CBC   WBC:  5.9 (05/19/2008)   RBC:  4.46 (05/19/2008)   Hgb:  14.2 (05/19/2008)  Hct:  40.9 (05/19/2008)   Platelets:  222 (05/19/2008)   MCV  91.6 (05/19/2008)   MCHC  34.8 (05/19/2008)   RDW  12.0 (05/19/2008)   PMN:  57.5 (05/19/2008)   Lymphs:  27.4 (05/19/2008)   Monos:  11.0 (05/19/2008)   Eosinophils:  3.2 (05/19/2008)   Basophil:  0.9 (05/19/2008)  Complete Metabolic Panel   Glucose:  98 (07/22/2009)   Sodium:  141 (07/22/2009)   Potassium:  3.8 (07/22/2009)   Chloride:  106 (07/22/2009)   CO2:  31 (07/22/2009)   BUN:  10 (07/22/2009)   Creatinine:  1.2 (07/22/2009)   Albumin:  4.4 (06/03/2009)   Total Protein:  7.4 (06/03/2009)   Calcium:  9.5 (07/22/2009)   Total Bili:  0.8 (06/03/2009)   Alk Phos:  45 (06/03/2009)   SGPT (ALT):  24 (06/03/2009)   SGOT (AST):  24 (06/03/2009)   Current Medications (verified): 1)  Labetalol Hcl 200 Mg Tabs (Labetalol Hcl) .... Two  Tablet Three Times Daily 2)  Tricor 145  Mg Tabs (Fenofibrate) .... Take 1 Tablet By Mouth Once A Day 3)  Lipitor 20 Mg Tabs (Atorvastatin Calcium) .... Take 1 Tablet By Mouth Once A Day 4)  Triamterene-Hctz 37.5-25 Mg Caps (Triamterene-Hctz) .... Take 1 Tablet By Mouth Once A Day 5)  Centrum  Tabs (Multiple Vitamins-Minerals) .... Take 1 Tablet By Mouth Once A Day 6)  Alavert 10 Mg Tabs (Loratadine) .... One Tab By Mouth Once Daily As Needed 7)  Aler-Tab 25 Mg Tabs (Diphenhydramine Hcl) .... Take 1 Tablet By Mouth Once A Day As Needed 8)  Arthritis Pain Relief 650 Mg Cr-Tabs (Acetaminophen) .... Take 1 Tablet By Mouth Once A Day As Needed 9)  Fish Oil 1000 Mg Caps (Omega-3 Fatty Acids) .... Take 1 Tablet By Mouth Once A Day 10)  Alph-E 400 Unit Caps (Vitamin E) .... Take 1 Tablet By Mouth Once A Day 11)  Vitamin C 1000 Mg Tabs (Ascorbic Acid) .... Take 1 Tablet By Mouth Once A Day 12)  Calcium 600/vitamin D 600-400 Mg-Unit Chew (Calcium Carbonate-Vitamin D) .... Take 1 Tablet By Mouth Daily 13)  Amitriptyline Hcl 25 Mg Tabs (Amitriptyline Hcl) .... One Tablet By Mouth At Bedtime As Needed Insomnia 14)  Norvasc 5 Mg Tabs (Amlodipine Besylate) .... Take 1 Tablet By Mouth Once A Day  Allergies (verified): 1)  ! Avapro (Irbesartan) 2)  ! Benicar (Olmesartan Medoxomil) 3)  Lisinopril  Past History:  Past Medical History: gestational diabetes hx - 2008 frequent headaches/migraines Hyperlipidemia Hypertension Allergic rhinitis renal insuffic  MD roster: renal - [facility] - [facility]  Past Surgical History: breast implants 1998 Hospitalized in 09/2006 for Hypertension post partum (not preeclmpsia)  Family History: Family History of Arthritis Family History Hypertension Family History of  emotional/mental illness Family History of Sudden Death  MGF DM   Social History: Occupation: Psychologist, occupational - [employer] x 26y; Musician, works from home Married, lives with spouse and son former smoker, quit 2007 rare alcohol useDoes  Patient Exercise:  yes Risk analyst Use:  yes  Review of Systems       see HPI above. I have reviewed all other systems and they were negative.   Physical Exam  General:  alert, well-developed, well-nourished, and cooperative to examination.  mild-mod overweight-appearing.   Head:  Normocephalic and atraumatic without obvious abnormalities. No apparent alopecia or balding. Eyes:  vision grossly intact; pupils equal, round and reactive to light.  conjunctiva and lids normal.  Ears:  normal pinnae bilaterally, without erythema, swelling, or tenderness to palpation. TMs clear, without effusion, or cerumen impaction. Hearing grossly normal bilaterally  Mouth:  teeth and gums in good repair; mucous membranes moist, without lesions or ulcers. oropharynx clear without exudate, no erythema.  Lungs:  normal respiratory effort, no intercostal retractions or use of accessory muscles; normal breath sounds bilaterally - no crackles and no wheezes.    Heart:  normal rate, regular rhythm, no murmur, and no rub. BLE without edema. Msk:  No deformity or scoliosis noted of thoracic or lumbar spine.   Neurologic:  alert & oriented X3 and cranial nerves II-XII symetrically intact.  strength normal in all extremities, sensation intact to light touch, and gait normal. speech fluent without dysarthria or aphasia; follows commands with good comprehension.  Psych:  Oriented X3, memory intact for recent and remote, normally interactive, good eye contact, not anxious appearing, not depressed appearing, and not agitated.      Impression & Recommendations:  Problem # 1:  HYPERTENSION (ICD-401.9) hx severe dz - currently low normal on monotx - advised to slowly resume other 2 antiHTN meds as body moves further away from miscarraige (dx today) - will also send for records of OV and labs done yesterday with renal - [facility]  Her updated medication list for this problem includes:    Labetalol Hcl 200 Mg Tabs (Labetalol  hcl) .Marland Kitchen..Marland Kitchen Two  tablet three times daily    Triamterene-hctz 37.5-25 Mg Caps (Triamterene-hctz) .Marland Kitchen... Take 1 tablet by mouth once a day    Norvasc 5 Mg Tabs (Amlodipine besylate) .Marland Kitchen... Take 1 tablet by mouth once a day  BP today: 110/78 Prior BP: 100/76 (07/22/2009)  Prior 10 Yr Risk Heart Disease: 2 % (08/19/2008)  Labs Reviewed: K+: 3.8 (07/22/2009) Creat: : 1.2 (07/22/2009)   Chol: 163 (06/03/2009)   HDL: 41.40 (06/03/2009)   LDL: 82 (06/03/2009)   TG: 198.0 (06/03/2009)  Problem # 2:  HYPERLIPIDEMIA (ICD-272.4)  ok to resume meds now- recheck FLP next OV Her updated medication list for this problem includes:    Tricor 145 Mg Tabs (Fenofibrate) .Marland Kitchen... Take 1 tablet by mouth once a day    Lipitor 20 Mg Tabs (Atorvastatin calcium) .Marland Kitchen... Take 1 tablet by mouth once a day  Labs Reviewed: SGOT: 24 (06/03/2009)   SGPT: 24 (06/03/2009)  Prior 10 Yr Risk Heart Disease: 2 % (08/19/2008)   HDL:41.40 (06/03/2009), 33.5 (05/19/2008)  LDL:82 (06/03/2009), 93 (05/19/2008)  Chol:163 (06/03/2009), 156 (05/19/2008)  Trig:198.0 (06/03/2009), 148 (05/19/2008)  Problem # 3:  GLUCOSE INTOLERANCE (ICD-271.3) hx gestational DM 2008 - to cont diet and exercise and will check annually with CPX labs - prior a1c 5.3 05/2009  Problem # 4:  OBESITY (ICD-278.00)  patient is working on diet and exercise.  encouragement given  Ht: 64 (11/25/2009)   Wt: 202.12 (11/25/2009)   BMI: 35.14 (07/22/2009)  Problem # 5:  RENAL INSUFFICIENCY (ICD-588.9)  continue with renal specialist  Complete Medication List: 1)  Labetalol Hcl 200 Mg Tabs (Labetalol hcl) .... Two  tablet three times daily 2)  Tricor 145 Mg Tabs (Fenofibrate) .... Take 1 tablet by mouth once a day 3)  Lipitor 20 Mg Tabs (Atorvastatin calcium) .... Take 1 tablet by mouth once a day 4)  Triamterene-hctz 37.5-25 Mg Caps (Triamterene-hctz) .... Take 1 tablet by mouth once a day 5)  Centrum Tabs (Multiple vitamins-minerals) .... Take 1 tablet by  mouth once a day 6)  Alavert 10 Mg Tabs (Loratadine) .Marland KitchenMarland KitchenMarland Kitchen  One tab by mouth once daily as needed 7)  Aler-tab 25 Mg Tabs (Diphenhydramine hcl) .... Take 1 tablet by mouth once a day as needed 8)  Arthritis Pain Relief 650 Mg Cr-tabs (Acetaminophen) .... Take 1 tablet by mouth once a day as needed 9)  Fish Oil 1000 Mg Caps (Omega-3 fatty acids) .... Take 1 tablet by mouth once a day 10)  Alph-e 400 Unit Caps (Vitamin e) .... Take 1 tablet by mouth once a day 11)  Vitamin C 1000 Mg Tabs (Ascorbic acid) .... Take 1 tablet by mouth once a day 12)  Calcium 600/vitamin D 600-400 Mg-unit Chew (Calcium carbonate-vitamin d) .... Take 1 tablet by mouth daily 13)  Amitriptyline Hcl 25 Mg Tabs (Amitriptyline hcl) .... One tablet by mouth at bedtime as needed insomnia 14)  Norvasc 5 Mg Tabs (Amlodipine besylate) .... Take 1 tablet by mouth once a day  Patient Instructions: 1)  it was good to see you today. 2)  will send for recent records of visit and labs with dr. Eliott Nine - 3)  continue to hold fluid pill and norvasc for blood pressure for now -  4)  Check your Blood Pressure regularly. If it is above: SBP 130/90, you should resume fluid pill and then resume norvasc to gain control of blood pressure 5)  go ahead and resume the lipitor and tricor now 6)  refills today as request - 7)  Please schedule a follow-up appointment in 3-6 months, call sooner if problems. will check labs including cholesterol at that visit Prescriptions: NORVASC 5 MG TABS (AMLODIPINE BESYLATE) Take 1 tablet by mouth once a day  #30 x 6   Entered by:   Orlan Leavens   Authorized by:   Newt Lukes MD   Signed by:   Orlan Leavens on 11/25/2009   Method used:   [facility]ron[facility] to        [facility] [address] [phone]* (retail)       [address]       [place], [place]  [address]       Ph: [phone] or [phone]       Fax: [phone]   RxID:   [id number] TRIAMTERENE-HCTZ 37.5-25 MG CAPS  (TRIAMTERENE-HCTZ) Take 1 tablet by mouth once a day  #30 x 6   Entered by:   Orlan Leavens   Authorized by:   Newt Lukes MD   Signed by:   Orlan Leavens on 11/25/2009   Method used:   [facility] to        [facility] [address] [phone]* (retail)       [address]       [place], [place]  [address]       Ph: [phone] or [phone]       Fax: [phone]   RxID:   [id number] LIPITOR 20 MG TABS (ATORVASTATIN CALCIUM) Take 1 tablet by mouth once a day  #30 x 6   Entered by:   Orlan Leavens   Authorized by:   Newt Lukes MD   Signed by:   Orlan Leavens on 11/25/2009   Method used:   [facility]ronically to        [facility] [address] [phone]* (retail)       [address]       [place], [place]  [address]  Ph: [phone] or [phone]       Fax: [phone]   RxID:   [id number] TRICOR 145 MG TABS (FENOFIBRATE) Take 1 tablet by mouth once a day  #30 x 6   Entered by:   Orlan Leavens   Authorized by:   Newt Lukes MD   Signed by:   Orlan Leavens on 11/25/2009   Method used:   Electronically to        [facility] [address] [phone]* (retail)       [address]       [place], [place]  [address]       Ph: [phone] or [phone]       Fax: [phone]   RxID:   [id number] LABETALOL HCL 200 MG TABS (LABETALOL HCL) two  tablet three times daily  #180 Tablet x 6   Entered by:   Orlan Leavens   Authorized by:   Newt Lukes MD   Signed by:   Orlan Leavens on 11/25/2009   Method used:   [facility]ronically to        [facility] [address] [phone]* (retail)       [address]       [place], [place]  [address]       Ph: [phone] or [phone]       Fax: [phone]   RxID:   [id number]    Pap Smear  Procedure date:  05/09/2008  Findings:      Interpretation/ Result:Negative for intraepithelial Lesion or Malignancy.     Mammogram  Procedure date:   05/09/1998  Findings:      No specific mammographic evidence of malignancy.

## 2010-07-24 ENCOUNTER — Telehealth: Payer: Self-pay | Admitting: Internal Medicine

## 2010-07-24 LAB — RH IMMUNE GLOBULIN WORKUP (NOT WOMEN'S HOSP): ABO/RH(D): A NEG

## 2010-07-24 LAB — CBC
HCT: 40 % (ref 36.0–46.0)
MCHC: 34.7 g/dL (ref 30.0–36.0)
RDW: 13.1 % (ref 11.5–15.5)

## 2010-07-27 NOTE — Progress Notes (Signed)
Summary: labetalol  Phone Note Refill Request Message from:  Fax from Pharmacy on July 24, 2010 1:36 PM  Refills Requested: Medication #1:  LABETALOL HCL 200 MG TABS two  tablet three times daily [facility]   Method Requested: Electronic Initial call taken by: Orlan Leavens RMA,  July 24, 2010 1:37 PM    Prescriptions: LABETALOL HCL 200 MG TABS (LABETALOL HCL) two  tablet three times daily  #180 Tablet x 5   Entered by:   Orlan Leavens RMA   Authorized by:   Newt Lukes MD   Signed by:   Orlan Leavens RMA on 07/24/2010   Method used:   [facility]ronically to        [facility] [address] [phone]* (retail)       [address]       [place], [place]  [address]       Ph: [phone] or [phone]       Fax: [phone]   RxID:   [id number]

## 2010-07-28 ENCOUNTER — Encounter: Payer: Self-pay | Admitting: *Deleted

## 2010-08-06 ENCOUNTER — Other Ambulatory Visit: Payer: Self-pay | Admitting: Internal Medicine

## 2010-08-24 ENCOUNTER — Telehealth: Payer: Self-pay | Admitting: Internal Medicine

## 2010-08-24 ENCOUNTER — Ambulatory Visit (INDEPENDENT_AMBULATORY_CARE_PROVIDER_SITE_OTHER): Payer: Managed Care, Other (non HMO) | Admitting: Internal Medicine

## 2010-08-24 ENCOUNTER — Other Ambulatory Visit (INDEPENDENT_AMBULATORY_CARE_PROVIDER_SITE_OTHER): Payer: Managed Care, Other (non HMO)

## 2010-08-24 ENCOUNTER — Encounter: Payer: Self-pay | Admitting: Internal Medicine

## 2010-08-24 DIAGNOSIS — E785 Hyperlipidemia, unspecified: Secondary | ICD-10-CM

## 2010-08-24 DIAGNOSIS — E739 Lactose intolerance, unspecified: Secondary | ICD-10-CM

## 2010-08-24 DIAGNOSIS — Z79899 Other long term (current) drug therapy: Secondary | ICD-10-CM

## 2010-08-24 DIAGNOSIS — F411 Generalized anxiety disorder: Secondary | ICD-10-CM

## 2010-08-24 DIAGNOSIS — I10 Essential (primary) hypertension: Secondary | ICD-10-CM

## 2010-08-24 DIAGNOSIS — F419 Anxiety disorder, unspecified: Secondary | ICD-10-CM

## 2010-08-24 LAB — LIPID PANEL
Cholesterol: 123 mg/dL (ref 0–200)
Triglycerides: 115 mg/dL (ref 0.0–149.0)

## 2010-08-24 LAB — HEPATIC FUNCTION PANEL
ALT: 26 U/L (ref 0–35)
AST: 20 U/L (ref 0–37)
Alkaline Phosphatase: 41 U/L (ref 39–117)
Bilirubin, Direct: 0.1 mg/dL (ref 0.0–0.3)

## 2010-08-24 LAB — HEMOGLOBIN A1C: Hgb A1c MFr Bld: 5.7 % (ref 4.6–6.5)

## 2010-08-24 MED ORDER — FENOFIBRATE 145 MG PO TABS: 145.0000 mg | ORAL_TABLET | Freq: Every day | ORAL | Status: DC

## 2010-08-24 MED ORDER — CITALOPRAM HYDROBROMIDE 10 MG PO TABS: 10.0000 mg | ORAL_TABLET | Freq: Every day | ORAL | Status: DC

## 2010-08-24 NOTE — Telephone Encounter (Signed)
Please call patient - normal results, cholesterol much improved, no DM. No medication changes recommended at this time but maybe able to reduce in future with continued weight loss and lifestyle changes as ongoing. Thanks.   Lab Results  Component Value Date   HGBA1C 5.7 08/24/2010   Lab Results  Component Value Date   LDLCALC 70 08/24/2010

## 2010-08-24 NOTE — Assessment & Plan Note (Signed)
Hx gestational DM - doing well with diet and exercise - weight loss reviewed Check annual a1c now

## 2010-08-24 NOTE — Assessment & Plan Note (Signed)
Will start SSRI - citalopram - risk/benefit of same discussed with pt who agrees

## 2010-08-24 NOTE — Assessment & Plan Note (Signed)
BP Readings from Last 3 Encounters:  08/24/10 112/82  02/24/10 110/76  11/25/09 110/78   The current medical regimen is effective;  continue present plan and medications.

## 2010-08-24 NOTE — Patient Instructions (Signed)
It was good to see you today. Test(s) ordered today. Your results will be called to you after review (48-72hours after test completion). If any changes need to be made, you will be notified at that time. Refill on medication(s) as discussed today. Start citalopram for stress/anxiety symptoms - Your prescription(s) have been submitted to your pharmacy. Please take as directed and contact our office if you believe you are having problem(s) with the medication(s). Please schedule followup in 6 months, call sooner if problems.

## 2010-08-24 NOTE — Progress Notes (Signed)
  Subjective:    Patient ID: [name], female    DOB: 12/07/1965, 45 y.o.   MRN: [id number]  HPI   here for followup -  1) HTN - has been on labetolol with diuretic and norvasc - hx severe HTN in past, no secondary cause identified - follows with renal for same - reports compliance with ongoing medical treatment and no changes in medication dose or frequency. denies adverse side effects related to current therapy.   2) dyslipidemia - resumed lipitor 12/2009 after miscarriage - reports compliance with ongoing medical treatment - no changes in medication dose or frequency. denies adverse side effects related to current therapy.   3) glucose intolerance with hx gestational DM 2008 -not on meds for same since childbirth 2008 - follows low carb diet and exercise in effort to control weight - +FH same -  Past Medical History  Diagnosis Date  . GLUCOSE INTOLERANCE   . Hypercalcemia   . OBESITY   . COMMON MIGRAINE 04/17/2008  . INSOMNIA 06/02/2009  . ALLERGIC RHINITIS   . HYPERLIPIDEMIA   . HYPERTENSION   . RENAL INSUFFICIENCY      Review of Systems  Eyes: Negative for visual disturbance.  Respiratory: Negative for cough.   Cardiovascular: Negative for chest pain.  Neurological: Negative for syncope.  Psychiatric/Behavioral:       Complains of anxiety related to stress       Objective:   Physical Exam  Constitutional: She appears well-developed and well-nourished. No distress.       overweight  Eyes: Conjunctivae and EOM are normal. Pupils are equal, round, and reactive to light.  Neck: Normal range of motion. Neck supple. No thyromegaly present.  Cardiovascular: Normal rate, regular rhythm and normal heart sounds.  Exam reveals no friction rub.   No murmur heard. Pulmonary/Chest: Effort normal and breath sounds normal. No respiratory distress. She has no wheezes.  Psychiatric: She has a normal mood and affect. Her behavior is normal. Judgment and thought content normal.    BP 112/82  Pulse 83  Temp(Src) 98.6 F (37 C) (Oral)  Ht 5\' 4"  (1.626 m)  Wt 202 lb 12.8 oz (91.989 kg)  BMI 34.81 kg/m2  SpO2 98%      Wt Readings from Last 3 Encounters:  08/24/10 202 lb 12.8 oz (91.989 kg)  02/24/10 208 lb (94.348 kg)  11/25/09 202 lb 1.9 oz (91.68 kg)   Lab Results  Component Value Date   WBC 7.3 12/04/2009   HGB 13.9 12/04/2009   HCT 40.0 12/04/2009   PLT 216 12/04/2009   CHOL 172 02/24/2010   TRIG 229.0* 02/24/2010   HDL 36.60* 02/24/2010   LDLDIRECT 99.8 02/24/2010   ALT 24 06/03/2009   AST 24 06/03/2009   NA 141 07/22/2009   K 3.8 07/22/2009   CL 106 07/22/2009   CREATININE 1.2 07/22/2009   BUN 10 07/22/2009   CO2 31 07/22/2009   TSH 2.00 06/03/2009   HGBA1C 5.6 06/03/2009    Assessment & Plan:  See problem list. Medications and labs reviewed today.

## 2010-08-24 NOTE — Assessment & Plan Note (Signed)
The current medical regimen is effective;  continue present plan and medications. Check labs today. 

## 2010-08-24 NOTE — Telephone Encounter (Signed)
Pt Notified with lab results & md response.Marland KitchenMarland Kitchen4/17/12@3 :38pm/LMB

## 2010-08-26 ENCOUNTER — Other Ambulatory Visit: Payer: Self-pay | Admitting: Internal Medicine

## 2010-09-08 ENCOUNTER — Ambulatory Visit (INDEPENDENT_AMBULATORY_CARE_PROVIDER_SITE_OTHER): Payer: Managed Care, Other (non HMO) | Admitting: Internal Medicine

## 2010-09-08 ENCOUNTER — Encounter: Payer: Self-pay | Admitting: Internal Medicine

## 2010-09-08 VITALS — BP 140/98 | HR 104 | Temp 99.9°F

## 2010-09-08 DIAGNOSIS — J029 Acute pharyngitis, unspecified: Secondary | ICD-10-CM

## 2010-09-08 DIAGNOSIS — J020 Streptococcal pharyngitis: Secondary | ICD-10-CM

## 2010-09-08 LAB — POCT RAPID STREP A (OFFICE): Rapid Strep A Screen: NEGATIVE

## 2010-09-08 MED ORDER — AMOXICILLIN-POT CLAVULANATE 875-125 MG PO TABS: 1.0000 | ORAL_TABLET | Freq: Two times a day (BID) | ORAL | Status: AC

## 2010-09-08 MED ORDER — MAGIC MOUTHWASH W/LIDOCAINE: 10.0000 mL | Freq: Three times a day (TID) | ORAL | Status: DC | PRN

## 2010-09-08 NOTE — Progress Notes (Signed)
  Subjective:     [name] is a 45 y.o. female who presents for evaluation of sore throat. Associated symptoms include suspected fevers but not measured at home, chills, myalgias, nasal blockage, pain while swallowing, post nasal drip, sinus and nasal congestion and sore throat. Onset of symptoms was 2 days ago, and have been rapidly worsening since that time. She is not drinking much. She has not had a recent close exposure to someone with proven streptococcal pharyngitis.  The following portions of the patient's history were reviewed and updated as appropriate: allergies, current medications, past medical history, past social history and problem list.  Review of Systems Pertinent items are noted in HPI.    Objective:    BP 140/98  Pulse 104  Temp(Src) 99.9 F (37.7 C) (Oral)  SpO2 95% General appearance: alert, flushed and mild distress Eyes: negative Ears: normal TM's and external ear canals both ears Nose: Nares normal. Septum midline. Mucosa normal. No drainage or sinus tenderness. Throat: normal findings: lips normal without lesions, gums healthy and teeth intact, non-carious and abnormal findings: exudates present and marked oropharyngeal erythema Neck: mild anterior cervical adenopathy, no carotid bruit, no JVD, supple, symmetrical, trachea midline and thyroid not enlarged, symmetric, no tenderness/mass/nodules Lungs: clear to auscultation bilaterally Heart: regular rate and rhythm, S1, S2 normal, no murmur, click, rub or gallop  Laboratory Strep test done. Results:negative.    Assessment:    Acute pharyngitis, likely  Strep throat.    Plan:    Patient placed on antibiotics. Use of OTC analgesics recommended as well as salt water gargles. Patient advised that he will be infectious for 24 hours after starting antibiotics.

## 2010-09-08 NOTE — Patient Instructions (Signed)
It was good to see you today. antibiotics and magic mouthwash for pharyngitis symptoms as discussed - Your prescription(s) have been submitted to your pharmacy. Please take as directed and contact our office if you believe you are having problem(s) with the medication(s). Alternate between ibuprofen and tylenol for aches, pain and fever symptoms as discussed: Dose every 4-6 hours using 400-600 mg ibuprofen alternating with 1000 mg acetaminophen  Hydrate and rest! if your symptoms continue to worsen (pain, fever, etc), or if you are unable take anything by mouth (pills, fluids, etc), you should go to the emergency room for further evaluation and treatment.

## 2010-09-21 NOTE — Consult Note (Signed)
[name], [name]            ACCOUNT NO.:  [id number]   MEDICAL RECORD NO.:  [id number]          PATIENT TYPE:  INP   LOCATION:  9371                          FACILITY:  [facility]   PHYSICIAN:  Genene Churn. Love, M.D.    DATE OF BIRTH:  19-Jan-1966   DATE OF CONSULTATION:  09/27/2006  DATE OF DISCHARGE:                                 CONSULTATION   REASON FOR CONSULTATION:  This 45 year old right-handed white married  female was readmitted to [facility] on Sep 26, 2006, for  reevaluation of headaches.   HISTORY OF PRESENT ILLNESS:  [name] delivered a 10-1/2 pound  healthy female by C-section in her first pregnancy on September 05, 2006.  At  that time she underwent spinal anesthesia.  Approximately 2 days after  delivery, she developed right frontal and vertex throbbing headaches  without associated nausea and vomiting, visual changes or focal  neurologic symptoms.  Over the last 3 weeks, she has continued to have  headaches without associated fever, chills or other neurologic symptoms.  She denies any aura but does have an intolerance to light and sound.  The headache has been worse possibly over the last 5 days.  She has been  unable to take care of herself or her baby and was readmitted Sep 26, 2006, with noted elevated blood pressure in the 180/130 range.  At the  time, she was seen at [facility] ER and received clonidine and labetalol.  She  has no definite evidence for eclampsia but has been on a mag drip since  admission.  She has not had significant edema in her legs and has no  history of proteinuria.  There has not been any visual disturbance or  seizure to suggest PRES syndrome.   PAST MEDICAL HISTORY:  Her past medical history is significant for  diabetes mellitus, significant hypertension, and a remote history of  migraine.  Her last was approximately 12 years ago and as mentioned her  headaches were usually accompanied by nausea and vomiting.   MEDICATIONS:  Her  medicines prior to her hospitalization for delivery  were Aldomet, glyburide, and Tylenol.  Her medications prior to this  admission after her discharge from delivery were labetalol, Reglan,  Percocet, and Tylenol.  Her medicines in the hospital have included mag  sulfate drip for the potential possibility of eclampsia.   LABORATORY DATA:  Her laboratory data has revealed a CT scan showing  enlarged lateral ventricle.  Sodium is 133, potassium 4.0, chloride 104,  CO2 content 25, BUN 7, creatinine 0.9, glucose 103.  Her alk phos was  122, SGOT and SGPT were 34 and 27.  Her total bilirubin was 0.8.  Her  white blood cell count was 6500, hemoglobin 15.1, hematocrit 241K.   EXAMINATION:  Her examination revealed a well-developed obese white  female whose blood pressure currently was 180/105 in the right and left  arm with large adult cuff.  Heart rate was 98.  She was afebrile.  The  neck was supple.  She had a negative Brudzinski and a negative Kernig's  sign.  Mental status:  She  was alert and oriented x3.  Her cranial nerve  examination revealed the visual fields were full, disks were flat, the  extraocular movements are full, corneals are present, there was no  seventh nerve palsy, hearing was intact, air conduction was greater than  bone conduction, tongue was midline, uvula was midline, gags were  present, sternocleidomastoid and trapezius testing were normal.  Motor  examination revealed good strength in the upper and in the lower  extremities.  She had good heel-to-shin and good finger-to-nose.  Her  sensory examination was intact to pinprick and two-point discrimination  in her hands.  She had good pinprick in her lower extremities.  Deep  tendon reflex were 2+.   IMPRESSION:  1. Vascular headaches, doubt post lumbar puncture headache because of      length of time the headache has lasted and lack of postural related      factors, code 784.0.  2. Hypertension, rule out posterior  reversible encephalopathy      syndrome, code 796.2.  3. History of migraine headaches in the remote past, code 346.10.  4. Obesity, code 278.01.  5. Gestational diabetes mellitus, code 250.60.   PLAN:  Plan at this time is to try her on IV Depacon 500 mg push over 5  minutes with repeat, again another 500 mg over 5 minutes if no relief,  also to obtain an MRI of the brain, intracranial MRA and MRV to look for  evidence of a hypercoagulable state and also to evaluate PRES syndrome.  And to discuss with Dr. Ambrose Mantle her current blood pressure medication and  regimen.           ______________________________  Genene Churn. Sandria Manly, M.D.     JML/MEDQ  D:  09/27/2006  T:  09/27/2006  Job:  [id number]   cc:   Malachi Pro. Ambrose Mantle, M.D.  Fax: [phone]   Adron Bene., M.D.  Fax: [phone]

## 2010-09-21 NOTE — Consult Note (Signed)
[name], [name]            ACCOUNT NO.:  [id number]   MEDICAL RECORD NO.:  [id number]          PATIENT TYPE:  EMS   LOCATION:  MAJO                         FACILITY:  [facility]   PHYSICIAN:  Deanna Artis. Hickling, M.D.DATE OF BIRTH:  1965/07/24   DATE OF CONSULTATION:  10/02/2006  DATE OF DISCHARGE:                                 CONSULTATION   CHIEF COMPLAINT:  Sweating, shortness of breath and unsteadiness.   HISTORY OF PRESENT ILLNESS:  I was asked by the patient to reassess her  one day after she was discharged from [facility] with diagnosis of  severe headaches and unsteadiness.   She is a 45 year old primigravida 2, para 0-0-1 who delivered a boy by  low transverse cesarean section on April29,2008.  Postpartum, she  is gravida 2, para 1-0-1-1.  She was admitted for primary cesarean  section because of gestational diabetes which was poorly controlled and  a macrosomic infant.  She was A negative, antibody negative, serology  nonreactive, rubella immune, hepatitis B surface antigen negative, HIV  negative, GC and chlamydia negative.  Abnormal glucose tolerance test  group B strep positive.  Cesarean section went well.  The patient was  discharged from the hospital May2.   She was readmitted to [facility] on May 20 for evaluation of  headaches.   Two days postpartum, she developed right frontal and vertex throbbing  headaches without associated nausea, vomiting, visual changes or focal  neurologic symptoms.   The patient's headaches persisted in the 3 weeks prior to admission  without other symptoms including fever, chills, alterations in vision or  neurologic function.  The patient has an intolerance to light sounds.  She was incapacitated.  On admission she was noted to be hypertensive  with a blood pressure 180/130 and was treated with clonidine and  labetalol.   PAST MEDICAL HISTORY:  1. Diabetes mellitus.  2. Hypertension.  3. Remote history of  migraine.  4. Hypertension that had been present for some time when she was in      her 52s that was thought to be essential in nature.   MEDICATIONS:  1. Aldomet for her blood pressure.  2. Glyburide for diabetes.   DISCHARGE MEDICATIONS:  1. Labetalol.  2. Reglan.  3. Percocet.  4. Tylenol.  5. She was placed on magnesium sulfate in the hospital also for her      blood pressure.   STUDIES:  CT scan of the brain showed enlarged lateral ventricle that  was nonspecific.  Her basic comprehensive metabolic panel was normal.  CBC was also normal.   The patient had a nonfocal, non-lateralized neurological examination.  MRI scan of the brain was carried out and showed normal MR venogram (no  venous sinus thrombosis) normal MRA.  MRI scan that showed lesions along  the corpus callosum that could be best seen in the sagittal flare  studies.  Also, there were some periventricular lesions as well.  On the  axial studies, the callosal lesions could not be seen, and there were a  few scattered white matter lesions seen on T2 and flare that were  away  from the ventricular system, and therefore thought to be nonspecific.  The differential diagnosis included vasculitis, multiple sclerosis and  small-vessel white matter disease.   The patient had a very thorough workup for her condition including a  renal consult with Dr. Camille Bal.   LABORATORY RESULTS:  Anti nuclear antibody was negative.  Anti-DNA  antibody was negative.  Free thyroxin was normal.  TSH was normal.  [facility] spotted fever titer was negative.  Erythrocyte  sedimentation rate was 10.  Urinalysis showed large blood, negative  protein.   Etiology of her abnormality was unclear.  Headaches subsided.  She was  treated with a combination of IV Depacon and her symptoms subsided.  She  was discharged on Topamax, Labetalol, Avapro, Maxzide and Dilaudid was  given to her for pain.   I do not know the doses of these  medications.   DRUG ALLERGIES:  None known.   REVIEW OF SYSTEMS:  Today when the patient got up, she felt unsteady on  her feet.  She was diaphoretic and had to have change of clothes.  She  was short of breath.  She has never had a prior problem with clotting.  She did not have swelling of her legs nor pain in her chest nor  nonproductive cough.  She was without fever.  She did not have nausea,  vomiting, diarrhea.  She had complained of some lower abdominal pain  from her incisional site.  She also complained of pain in her left arm  from mild phlebitis from her IV.  The remainder of her 12 system review  is unremarkable.   PAST SURGICAL HISTORY:  Cesarean section as noted above.   FAMILY HISTORY:  Positive for hypertension in her mother, onset in her  76s stroke in her mother.  Myocardial infarction maternal uncle.  Mother  with thyroid disease (cold nodules that required surgery).  Father died  from motor vehicle accident.  Older brother died from drug overdose.  Younger brother has no medical issues.   SOCIAL HISTORY:  The patient is married for second time.  She works in  the [employer] at [employer] [place].  She does not use tobacco,  occasionally drinks alcohol.  Her husband works in the [employer]   PHYSICAL EXAMINATION:  GENERAL:  On examination today, this pleasant  woman is in no acute distress.  She says that she feels better as long  as she lies down.  She is somewhat lightheaded when she stands up.  VITAL SIGNS:  Blood pressure 133/97 right arm sitting, resting pulse  121, respirations 16, oxygen saturation 99%, temperature 98.6.  HEENT:  No signs of infection.  NECK:  Supple.  Full range of motion.  No cranial or cervical bruits.  LUNGS:  Clear to auscultation.  HEART:  No murmurs.  Pulses normal.  ABDOMEN:  Soft, nontender.  Bowel sounds normal. EXTREMITIES:  Well-formed without edema, cyanosis, alterations in tone  or tight heel cords.  She does  have some tenderness in left forearm, but  no evidence of cords or enlargement of either leg.  NEUROLOGICAL:  Mental status: The patient was awake, alert, attentive,  appropriate no dysphasia, dyspraxia.  Cranial nerves: Round reactive  pupils.  Normal fundi.  Full visual fields.  Double simultaneous  stimuli.  Extraocular movements full and conjugant.  Symmetric facial  strength, midline tongue and uvula.  Air conduction greater than bone  conduction bilaterally.  Motor examination: Normal strength on mass.  Good fine motor movements.  No pronator drift.  Sensation intact to cold  vibration, stereognosis.  Cerebellar examination: Good finger-to-nose,  rapid movements.  Gait was a bit unsteady, but she was not dystaxic,  just very careful in the way she walked.  Deep tendon reflexes were  symmetric and diminished.  The patient had bilateral flexor plantar  response.   IMPRESSION:  1. 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  I do not think this is organic or if      it is, it is related more to her general condition than a specific      neurologic problem.  2. Shortness of breath and profuse sweating which is not present at      this time.   The patient had a variety of laboratory studies carried out which showed  pH 7.42, PCO2 of 44.9, bicarbonate 29.1, delta base 4, hemoglobin 17,  hematocrit 51.0, sodium 134, potassium 4.1, chloride 105, BUN 16,  creatinine 1.1.  Her D-dimer was elevated at 0.82.  Cardiac markers were  normal.  Prothrombin time 12.4, INR of 0.9, PTT pending at this time.   PLAN:  The patient will have a CT chest scan of her lungs to look for  pulmonary embolus.  I do not believe that she needs further neuro  diagnostic workup.  I would not change her medications at this time,  including her Topamax.  If she needs admission, it should be on the  medical service, because she does not have focal neurologic deficit.  She needs to find her primary care doctor to manage her  hypertension.  I  will discuss the case with Dr. Sandria Manly tomorrow and arrange for follow-up  in our office with him.      Deanna Artis. Sharene Skeans, M.D.  Electronically Signed     [facility]/MEDQ  D:  10/02/2006  T:  10/02/2006  Job:  [id number]   cc:   Malachi Pro. Ambrose Mantle, M.D.  Genene Churn. Love, M.D.

## 2010-09-21 NOTE — Consult Note (Signed)
NAMEGIULIETTA, [name]            ACCOUNT NO.:  [id number]   MEDICAL RECORD NO.:  [id number]          PATIENT TYPE:  INP   LOCATION:  9371                          [facility]ACILITY:  [facility]   PHYSICIAN:  Cynthia B. Eliott Nine, M.D.DATE OF BIRTH:  02/15/1966   DATE OF CONSULTATION:  DATE OF DISCHARGE:  09/27/2006                                 CONSULTATION   REASON FOR CONSULTATION:  We are asked to see this patient secondary to  severe hypertension.  She is a 45 year old white female with a history  of hypertension for 10-15 years.  The onset of her hypertension was in  her mid-20s, initially diagnosed by a physician in [place].  She was  treated with Toprol which she says was eventually weaned off.  She had  no workup at that time for secondary causes.   She moved here to [facility] about two years ago and has really had no  primary care physician during that time.  She redeveloped hypertension  during her recent pregnancy which was managed with oral labetalol.  She  states she has marked edema during pregnancy which was treated with a  seven-day course of a diuretic which was discontinued because of her  wish to breastfeed.  She was discharged home after a C-section on August 31, 2006 on labetalol 300 mg twice a day.   She has a long history of headaches but, during her late pregnancy and  in the few weeks postpartum, they have been severe.  She presented to  the emergency department at [facility] on Sep 26, 2006 with a  severe headache and blood pressures in the 180/110-128 range.  Urinalysis showed blood but no protein.  Serum creatinine was 0.85.  She  was treated with Imitrex for what was said to be a migraine and also  received oral clonidine and IV labetalol.  Because of the severity of  her hypertension and the concerns about possible postpartum  preeclampsia, she was transferred to [facility] from the ED and  placed on IV magnesium.  This has subsequently been  discontinued.  MRI  of the brain showed moderate lateral ventricular dilatation and  nonspecific periventricular white matter lesions.  There was also mild  to moderate irregularity of the right middle cerebral artery.  She has  been followed by Dr. Sandria Manly since hospitalization.   She received IV steroids, although the reason for that is not fully  clear to me.  She has been on various combinations of p.o. labetalol, IV  labetalol, Procardia and received one dose of Maxzide, one dose of  Diovan, some IV hydralazine and is now on a nicardipine drip at 2.5 mg  per hour.  She has been receiving Ativan, narcotics and Midrin for her  headaches.   With regard to the medication regimen for her hypertension, she states  that she is willing to forego breast-feeding in order to get on a  medical regimen that will be effective.   PAST MEDICAL HISTORY:  1. Hypertension, onset in her mid-20s as outlined above with no workup      for secondary causes according  to her.  2. History of migraine headaches.  3. Gestational diabetes which required treatment with oral agents with      mild persistent glucose intolerance.   CURRENT MEDICATIONS:  1. Nicardipine drip 2.5 mg per hour.  2. Labetalol 300 mg t.i.d. was ordered today but she has not yet      received a dose.  She did receive 120 mg of IV labetalol between 10      a.m. and 3 p.m. yesterday.  3. Reglan 5 mg every eight hours IV.  4. Procardia XL 90 mg per day - this dose was held last evening as she      has not received a dose of this for 24 hours.  5. Protonix 40 mg a day.  6. Topamax 25 mg at bedtime.  7. Dilaudid 2 mg IV every six hours as needed.  8. Midrin 1 capsule every six hours as needed, received at 5 a.m.      today.  9. Compazine 10 mg IV every six hours as needed.  10.Phenergan 25 mg IV q.6h. p.r.n.   FAMILY HISTORY:  Family history is positive for hypertension in her  mother with onset in her 44s, a stroke in her mother, MI in  a maternal  uncle and her mother had thyroid disease (specifically cold nodules  which required some surgery).  Father died in a motor vehicle accident,  older brother died of a drug overdose and her younger brother has no  medical issues.   SOCIAL HISTORY:  The patient is married for the second time.  She has  one son who was delivered by a C-section on August 31, 2006.  She works  in [employer] at [employer] [place] which she considers to be  a very stressful position.  She does not smoke and occasionally uses  alcohol.  Her husband works with the water department.   REVIEW OF SYSTEMS:  Review of systems is positive for severe headaches  which has worsened over the past two weeks.  She localizes to the right  temporal parietal occipital area.  There have been no vision  disturbances, numbness, tingling, syncope or near syncope, although she  reports feeling dizzy.  She denies chest pain or shortness of breath but  does occasionally have palpitations and the sensation that her heart is  pounding as well as some flushing.  She has had some nausea.  She denies  abdominal pain.  She had edema during her pregnancy which is still  present to some degree but much better.  She has no claudication  symptoms.  She does report fatigue.   PHYSICAL EXAMINATION:  GENERAL:  She is a flushed-appearing white female  who looks tired and uncomfortable.  VITAL SIGNS:  Weight is 199 pounds.  Supine blood pressure 146/92 with  heart rate of 102, sitting 138/88 with a heart rate of 100, standing  139/84 with heart rate of 132.  HEENT:  Pupils are reactive to light accommodation but are both somewhat  dilated.  She has no JVD.  No carotid bruits.  LUNGS:  Entirely clear.  CARDIAC:  Rhythm regular, S1-S2 no S3.  She has a 1/6 murmur at the  upper sternal border.  No diastolic murmur.  No pericardial friction  rub. ABDOMEN:  Obese.  There is a well-healed C-section scar and some belly  button  piercings.  Bowel sounds are present.  There are no abdominal  bruits.  Liver edge is not palpable.  She had some minimal tenderness  over her C-section site.  There is trace pretibial edema.   LABORATORY DATA:  Laboratory from Sep 26, 2006 revealed sodium 136,  potassium 4.0, chloride 104, CO2 25, BUN 7, creatinine 0.85, glucose  103, alkaline phosphatase 122, SGOT and PT normal.  Albumin 4.1,  hemoglobin 15.6, WBC 6500, platelets 241,000.  Urinalysis was negative  for protein and did show blood with too numerous to count red cells.  ANA negative, free T4 1.21.  TSH low at 0.191.  Sed rate was 10 and  fractionated metanephrines are pending.   IMPRESSION:  This is a 45 year old white female with a history of  hypertension, initially diagnosed in her mid-20s and treated for several  years, which has recurred during pregnancy and is now severe one month  postpartum.  She has not to her knowledge ever had a workup for  secondary causes despite the young age of onset.  Her renal function is  normal.  The status of her renal arteries is not known.  At some point,  at least 6-8 weeks postpartum, she should probably have an evaluation to  rule out renal artery stenosis.  Fibromuscular disease should probably  be excluded because of the young age of onset of her hypertension.   She has no history of unprovoked hypokalemia that I can find although  there are occasional cases of postpartum hypertension caused by  preexisting hyperaldosteronism that surface after pregnancy and are not  necessarily associated with hypokalemia but again, once all of the  pregnancy physiology has returned to normal in 6-8 weeks, this should be  evaluated   Therefore, the goal at this point in time is to find a tolerable choice  of medications which will control her blood pressure and she is willing  to give up the idea of breastfeeding in this regard.   RECOMMENDATIONS:  1. I agree with the previous order to  resume p.o. labetalol at a dose      of 300 mg t.i.d.  There is plenty of room to increase the dose to      as high as 800 mg t.i.d. if need be.  2. I would favor the use of an ACE inhibitor or angiotensin receptor      blocker since she states that breastfeeding is not an issue.      Avapro is on the formulary here.  Would start with a dose of 150 mg      per day.  3. Would wean nicardipine as these two medications are started.  4. I would be less inclined to continue the calcium channel blocker      simply because of the headache and reflux tachycardia issues.  5. Obtain renal ultrasound to look at kidney size and symmetry.  6. Would obtain an outpatient evaluation to look for the possibility      of underlying renal artery stenosis.  As far as workup for other      secondary causes, we can do 24-hour urine for aldosterone and she      already has metanephrines pending.  Thanks for asking [place] to see this lady.  Will follow with you and assist  in controlling her hypertension as well as identifying any secondary  causes.           ______________________________  Duke Salvia Eliott Nine, M.D.     CBD/MEDQ  D:  09/30/2006  T:  10/01/2006  Job:  [id number]

## 2010-09-24 NOTE — Discharge Summary (Signed)
[name], [name]            ACCOUNT NO.:  [id number]   MEDICAL RECORD NO.:  [id number]          PATIENT TYPE:  INP   LOCATION:                                FACILITY:  [facility]   PHYSICIAN:  Zenaida Niece, M.D.DATE OF BIRTH:  1965/10/16   DATE OF ADMISSION:  10/01/2006  DATE OF DISCHARGE:  10/01/2006                               DISCHARGE SUMMARY   ADMISSION DIAGNOSIS:  Postpartum headache and hypertension.   DISCHARGE DIAGNOSES:  Postpartum hypertension, migraine headache.   CONSULTATIONS:  Evie Lacks, M.D. and Duke Salvia. Eliott Nine, M.D.   HISTORY AND PHYSICAL:  Please see chart for full history and physical  but briefly this is a 45 year old female para 1-0-0-1 who is 3 weeks  postpartum who was transferred from [facility] ER due to severe headache only  partially relieved by Imitrex, Dilaudid and Compazine.  The patient  delivered by C-section with spinal anesthesia on April 29 for a 10  pounds 5 ounces infant and the pregnancy was complicated by hypertension  and gestational diabetes controlled with glyburide.  The patient began  having a headache 2 days after delivery that persisted and over the last  4 days became disabling.  Her headache was steady and throbbing in the  right temporal parietal area and exacerbated by light and sound and  associated with nausea without vomiting.  Has no relation to position  and she denies any vision changes.  On May 20 she went to [facility] emergency  room and was evaluated for 12 hours.  That time blood pressure was  elevated to 180/130.  She was treated with clonidine and labetalol and  headache persisted.  She had PIH labs done which were normal.  CT scan  of the head revealed prominent lateral ventricles.  She was transferred  to [facility] for continued management of her headache and hypertension.   PAST MEDICAL HISTORY:  Hypertension, migraines, cystitis,  pyelonephritis, and gestational diabetes.   OPERATIONS:  Cesarean section and breast  augmentation.   PHYSICAL EXAM:  VITAL SIGNS:  She is afebrile.  Blood pressures are 140-  170 over 100s to 110s, pulse was 80, respirations were 16.  NEUROLOGY:  Dr. Ebony Hail neurological exam was normal.  HEART: Regular rate and rhythm without murmur.  LUNGS:  Clear.  ABDOMEN: Soft, nontender without mass, incision is healing well.   HOSPITAL COURSE:  Dr. Ambrose Mantle admitted the patient and put her on  magnesium sulfate.  He consulted [facility] who  initially suggested increasing her labetalol to better control her blood  pressure.  Anesthesia was consulted and they did not feel that her  headache was due to her spinal.  Neurology was consulted and they felt  that she should get a head MRI/MRA.  She was eventually put on a Cardene  drip to control her blood pressure and magnesium, hydralazine and  labetalol were discontinued.  She was also put on IV Decadron.  A  headache at this time on May 22 persisted.  The MRI/MRA revealed  moderate ventricular dilatation without ventricular obstruction and  numerous periventricular white matter lesions  not seen in the cerebellum  or brainstem. MR angiography shows irregularity of the M1 segment of the  MCA which could suggest vasculitis.  She continued to require IV  Dilaudid and Percocet to control her headache and her blood pressure  still remained difficult to control.  Her magnesium was discontinued due  to the fact that she was put on a calcium channel blocker to control her  blood pressure.  She was transitioned to p.o. Procardia to control her  blood pressure and this seemed to work better.  Dr. Sandria Manly in his note on  May 23 felt that this headache was due to intractable migraine headache.  She was continued on the Decadron and started on ergotamine protocol  since her blood pressure was controlled.  Dr. Sandria Manly and Dr. Ambrose Mantle then  requested Dr. Eliott Nine to see the patient due to her persistently elevated  blood pressure  and headache.  Her TSH was low suggesting  hyperthyroidism.  Dr. Eliott Nine saw the patient on the 24th and felt that  it was reasonable to restart labetalol as well as start Avapro.  She had  also at one point been put on Midrin for her headaches and she  recommended stopping this.  Blood pressures improved.  She had a renal  ultrasound performed which was normal and her headache did gradually  improve.  On the afternoon of May 25 her blood pressures were well  controlled.  Her headache was controlled and she was felt to be stable  enough for discharge home at that point.   DISCHARGE INSTRUCTIONS:  Follow-up is in 3 weeks with both Dr. Sandria Manly and  Dr. Eliott Nine and as previously scheduled with Dr. Ambrose Mantle.   MEDICATIONS:  1. Labetalol 400 mg p.o. t.i.d.  2. Maxzide 25 mg one p.o. daily.  3. Avapro 150 mg p.o. daily.   ACTIVITY:  Is as tolerated.      Zenaida Niece, M.D.  Electronically Signed     TDM/MEDQ  D:  11/20/2006  T:  11/20/2006  Job:  [id number]   cc:   Evie Lacks, MD  Fax: [phone]   Duke Salvia. Eliott Nine, M.D.  Fax: [phone]

## 2010-09-24 NOTE — H&P (Signed)
Hebert, Victoria            ACCOUNT NO.:  [id number]   MEDICAL RECORD NO.:  [id number]          PATIENT TYPE:  INP   LOCATION:  NA                            [facility]ACILITY[facility]  [facility]   PHYSICIAN:  Malachi Pro. Ambrose Mantle, M.D. DATE OF BIRTH:  Dec 24, 1965   DATE OF ADMISSION:  DATE OF DISCHARGE:                              HISTORY & PHYSICAL   This is a 45 year old white female, para 0-0-1-0, gravida 2, EDC Sep 08, 2006, by ultrasound, admitted for primary C-section because of  gestational diabetes, slightly poorly controlled with glyburide and  macrosomic infant.  Blood group and type A negative.  Negative antibody.  Nonreactive serology.  Rubella immune.  Hepatitis B surface antigen  negative.  HIV negative.  GC and Chlamydia negative.  One-hour Glucola  194.  Three-hour GTT fasting 105; one-hour 226; two-hour 181; three-hour  165.  Group B strep is positive.  This patient had a vaginal ultrasound  on March 09, 2006, crown-rump length was 7.68-cm, 13 weeks 6 days, [facility]  Sep 08, 2006.  A followup ultrasound on April 18, 2006, showed an  average gestational age of [redacted] weeks 3 days with [facility] of Sep 09, 2006.  A  followup ultrasound for better anatomy on May 16, 2006, showed an [facility]  of Sep 12, 2006.  It showed normal findings.  The patient was seen in the  emergency room on April 23, 2006, at 20 weeks with post coital  bleeding and this cleared.  The patient was seen at the nutrition and  diabetes outpatient management services for instruction in diet and  blood glucose monitoring.  She received RhoGAM at 28 weeks.  Her blood  pressure was elevated at 31 weeks and she was placed on Aldomet twice  daily.  Her pregnancy was followed with frequent blood sugar testing.  She was begun on glyburide initially at 2.5 mg a day.  It was  increased  to 5 mg a day on August 01, 2006, and her Aldomet was increased to 500 mg  twice a day at the same time.  On August 08, 2006, the glyburide was  increased to  5 mg in the morning and 2.5 mg at night.  Nonstress test  two times a week were begun.  They have always been normal.  Her fasting  blood sugars on August 14, 2006, ranged from 86 to 97, one-hour blood  sugars were slightly high at 121-182 after breakfast, 97-142 after  lunch, and 122-165 after supper.  On August 22, 2006, the fasting values  were 72-87, one-hour breakfast 92-187, one-hour lunch 91-163, and one-  hour supper 114-160.  Blood sugars were never completely normal but not  significantly out of control.  Because of the suspected large for  gestational age infant and ultrasound was done on July 25, 2006, at  approximately 50 weeks' gestation showed a 5 pounds 7 ounces infant,  83.3 percentile.  AFI was at the upper limits of normal.  Because of  persistent elevated fundal height and suspected macrosomia, ultrasound  was repeated on August 31, 2006, and it showed an estimated weight of  10  pounds and 5 ounces with normal AFI.  I spoke to Dr. Margot Ables concerning  the fact that the patient had an unfavorable cervix, had diabetes that  was not completely well controlled on glyburide, and hypertension.  It  was fairly well controlled on Aldomet and had a estimated fetal weight  of 10 pounds 5 ounces.  He felt the patient should be offered C-section  which I did and she agrees to proceed with primary cesarean section.   PAST MEDICAL HISTORY:  1. NO KNOWN ALLERGIES.  2. She did have breast enlargement in 1998.  3. She has had cystitis and pyelonephritis as a teenager.  4. She has had sporadic hypertension.  5. Migraines.   ALCOHOL TOBACCO AND DRUGS:  None.   FAMILY HISTORY:  Mother with high blood pressure and CVA and thyroid  dysfunction.   PHYSICAL EXAMINATION:  GENERAL:  Five feet 4 inches, 232 pounds, white  female in no distress.  VITAL SIGNS:  On September 01, 2006, her blood pressure was 142/100.  HEENT:  Normal.  LUNGS:  Clear to auscultation.  HEART:  Normal size and sounds.   No murmurs.  ABDOMEN:  Soft.  Fundal height on September 01, 2006, 44-cm.  Fetal heart  tones normal.  A nonstress test on September 04, 2006 was normal and  reactive.  PELVIC:  Cervix is probably closed, vertex, somewhat high.   ADMITTING IMPRESSION:  1. Intrauterine pregnancy at 39+ weeks.  2. Gestational diabetes mellitus somewhat poorly controlled on      glyburide.  3. Hypertension fairly well controlled on Aldomet.  4. Group B strep positive.  5. History of migraines.   The patient has been offered and she accepts a primary cesarean section  because of suspected macrosomia.  Estimated fetal weight by ultrasound  10 pounds 5 ounces on August 31, 2006.  The patient is prepared for  cesarean section and is ready to proceed.      Malachi Pro. Ambrose Mantle, M.D.  Electronically Signed     TFH/MEDQ  D:  09/04/2006  T:  09/04/2006  Job:  [id number]

## 2010-09-24 NOTE — Discharge Summary (Signed)
[name], [name]            ACCOUNT NO.:  [id number]   MEDICAL RECORD NO.:  [id number]          PATIENT TYPE:  INP   LOCATION:  9104                          [facility]ACILITY:  [facility]   PHYSICIAN:  Malachi Pro. Ambrose Mantle, M.D. DATE OF BIRTH:  1965/08/19   DATE OF ADMISSION:  09/05/2006  DATE OF DISCHARGE:                               DISCHARGE SUMMARY   HOSPITAL COURSE:  This is a 45 year old white female, para 0-0-1-0,  gravida 2, EDC Sep 08, 2006, admitted for primary C-section because of  gestational diabetes, slightly poorly controlled with glyburide, and a  macrosomic infant.  Blood group and type:  A negative, negative  antibody.  Nonreactive serology.  Rubella immune.  Hepatitis B surface  antigen negative.  HIV negative.  GC and Chlamydia negative.  One-hour  Glucola 194.  Three-hour GTT:  Fasting 105, one-hour 226, two-hour 181,  three-hour 165.  Group B strep positive.  The patient was placed on  glyburide after diet did not control her blood pressure.  The glyburide  was gradually increased to 5 mg at night and 2.5 in the mornings and  even on that dose, the blood sugar remained intermittently slightly  elevated; fastings were usually in the 86-97 range; at breakfast, 121-  182; lunch, 97-142; and supper, 122-165.  The baby seemed to be large  for dates, so an ultrasound at 33 weeks suggested a fetal weight of 5  pounds 7 ounces, 83rd percentile.  Because of persistent elevations of  the fundal height to 43 cm, another ultrasound on August 31, 2006 showed  an estimated weight of 10 pounds 5 ounces with a normal AFI.  I spoke to  Dr. Margot Ables concerning the fact that the patient had an unfavorable  cervix, had diabetes that was not completely well-controlled on  glyburide and hypertension.  He felt the patient should be offered a C-  section, which I did offer the patient and she agreed to proceed with  primary C-section.  The patient was admitted and underwent a low  transverse  cervical C-section by Dr. Ambrose Mantle with Dr. Ellyn Hack assisting  under spinal anesthesia.  Vacuum assistance was used for delivery of the  head; the remainder of the baby was delivered.  Infant was 10 pounds 5  ounces.  Apgars were 9 at 1 and 9 at 5 minutes.  Normal uterus, tubes  and ovaries were noted.  Postpartum, the patient did well.  Her blood  pressure remained slightly elevated for the most part in the 130s over  90s range; however, on the afternoon of the 2nd postpartum day, she did  have a blood pressure of 151/103, so PIH labs were drawn, which were  normal, and the patient was taken off Aldomet and placed on labetalol  200 mg twice a day; since then, her blood pressures have been 147/99,  135/98, 122/85 and 139/93.  She remained afebrile throughout her  hospital course.  She ambulated well without difficulty, voided well,  passed flatus.  Her initial hemoglobin was 14.2, hematocrit 41.9, white  count 8100, platelet count 168,000; followup hemoglobin was 11.8,  hematocrit 34.1 and  hemoglobin 11.5 and hematocrit 33.7; platelet counts  were 168,000, 154,000 and 201,000.  Comprehensive metabolic profiles  including PIH panel on Sep 07, 2006 were completely normal.  Urinalysis  was negative.  The RhoGAM workup indicated the need for RhoGAM and the  patient was given RhoGAM on September 06, 2006.  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  RPR was nonreactive.  Patient's capillary blood glucoses during her hospitalization:  Her  initial CBG on the 30th of April was 131.  Fasting on the 30th of April  was 137.  One-hour CBGs after meals were 137, 154 and 141.  On Sep 07, 2006, her 1-hour CBGs were 154 and 141 and fasting was 84.  On May 2,  her fasting was 101.  These are consistent with glucose intolerance, but  not diabetes and the patient is not discharged on any anti-diabetic  agents.   FINAL DIAGNOSES:  1. Intrauterine pregnancy at 39+ weeks, delivered  vertex by vacuum      assistance and cesarean section.  2. Gestational diabetes mellitus, controlled relatively well with      glyburide.  3. Hypertension.  4. Macrosomic infant.   OPERATION:  1. Low transverse cervical cesarean section.  2. Vacuum assistance with delivery of the vertex.   FINAL CONDITION:  Improved.   INSTRUCTIONS:  Regular discharge instruction booklet.   FOLLOWUP:  The patient is advised to return to the office in 10 days for  followup examination.   DISCHARGE MEDICATIONS:  Percocet 5/325 mg, thirty-six tablets, one every  4-6 hours as needed for pain and labetalol 200 mg twice a day are given  at discharge.      Malachi Pro. Ambrose Mantle, M.D.  Electronically Signed     TFH/MEDQ  D:  09/08/2006  T:  09/08/2006  Job:  [id number]

## 2010-09-24 NOTE — Op Note (Signed)
[name], ELSON            ACCOUNT NO.:  [id number]   MEDICAL RECORD NO.:  [id number]          PATIENT TYPE:  INP   LOCATION:  9104                          [facility]ACILITY:  [facility]   PHYSICIAN:  Malachi Pro. Ambrose Mantle, M.D. DATE OF BIRTH:  [birth year]   DATE OF PROCEDURE:  09/05/2006  DATE OF DISCHARGE:                               OPERATIVE REPORT   PREOPERATIVE DIAGNOSIS:  Intrauterine pregnancy at 39+ weeks,  gestational diabetes mellitus on Glyburide, hypertension, presumed  macrosomia.   POSTOPERATIVE DIAGNOSIS:  Intrauterine pregnancy at 39+ weeks,  gestational diabetes mellitus on Glyburide, hypertension, presumed  macrosomia.   OPERATION:  Low transverse cervical C-section, vacuum-assisted delivery  of a 10 pound 5 ounce female infant without Apgars of 9 at 1 and 9 at 5  minutes.   OPERATOR:  Dr. Ambrose Mantle   ASSISTANT:  Sherron Monday, M.D.   ANESTHESIA:  Spinal anesthesia.   DESCRIPTION OF PROCEDURE:  The patient was brought to the operating room  and placed under spinal anesthesia by Dr. Mal Amabile.  She was then  placed on the table in the left lateral tilt position.  Fetal heart  tones were auscultated at 140 and were normal. The abdomen and urethra  were prepped with Betadine solution.  A Foley catheter was inserted to  straight drain.  The abdomen was then draped as a sterile field. The  anesthesia was confirmed by pinching the abdominal wall with Allis  clamp.  A transverse incision was made and carried in layers through the  skin and subcutaneous tissue and fascia. Incision was made on the upper  fold of the abdominal panniculus so as to avoid an incision that could  not breathe. The fascia was incised transversely, separated from the  rectus muscles superiorly and inferiorly.  The rectus muscle was already  slightly split in the midline.  It was further opened. The peritoneal  cavity was entered and with blunt dissection the incision in the  peritoneum was enlarged. I  exposed the lower uterine segment, confirmed  the position of the fetal head, made the incision in the lower uterine  segment above the fetal head. I made the incision through the first part  of the myometrium and took my finger and entered the amniotic sac and a  large amount of fluid was obtained.  The incision was enlarged by  pulling superiorly and inferiorly.  I then elevated the vertex through  the incisional opening. Dr. Ellyn Hack exerted fundal pressure, but we could  not deliver the vertex.  We therefore put a kiwi self controlled vacuum  on the fetal vertex and then without difficulty lifted the vertex  through the incisional opening.  Then we were able to deliver both  shoulders.  The rest of the baby. The cord was clamped. The infant was  given to Dr. Dorene Grebe who was in attendance.  He assigned the 10  pounds 5 ounces female infant Apgars of 9 at 1 and 9 at 5 minutes.  The  placenta was removed, the inside of the uterus was inspected and found  to be free of any more debris.  The cervix was dilated with a ring  forceps and the uterine incision was closed with two running sutures of  0 Vicryl locking the first layer, nonlocking on the second layer.  Hemostasis was completely adequate.  I did inspect both tubes and  ovaries which were normal.  The uterus was normal.  I did exert some  manual pressure on the uterus to improve its tone after the placenta was  delivered. The gutters and the anterior cul-de-sac were blotted free of  blood.  Liberal irrigation confirmed hemostasis and the abdominal wall  was closed in layers using interrupted sutures of 0 Vicryl on the rectus  muscle and peritoneum  where it was available, two running sutures of 0 Vicryl on the use  fascia.  A running 3-0 Vicryl on the subcu tissue and staples on the  skin.  The patient seemed to tolerate the procedure well.  Blood loss I  estimated at approximately 1000 mL.  Sponge and needle counts were  correct and  she was returned to recovery in satisfactory condition.      Malachi Pro. Ambrose Mantle, M.D.  Electronically Signed     TFH/MEDQ  D:  09/05/2006  T:  09/05/2006  Job:  [id number]

## 2010-09-27 ENCOUNTER — Encounter: Payer: Self-pay | Admitting: Internal Medicine

## 2010-09-27 ENCOUNTER — Ambulatory Visit (INDEPENDENT_AMBULATORY_CARE_PROVIDER_SITE_OTHER): Payer: Managed Care, Other (non HMO) | Admitting: Internal Medicine

## 2010-09-27 VITALS — BP 110/82 | HR 79 | Temp 98.5°F | Ht 64.0 in

## 2010-09-27 DIAGNOSIS — J029 Acute pharyngitis, unspecified: Secondary | ICD-10-CM

## 2010-09-27 DIAGNOSIS — K209 Esophagitis, unspecified without bleeding: Secondary | ICD-10-CM | POA: Insufficient documentation

## 2010-09-27 MED ORDER — FLUCONAZOLE 150 MG PO TABS: 150.0000 mg | ORAL_TABLET | Freq: Every day | ORAL | Status: AC

## 2010-09-27 MED ORDER — HYDROCODONE-HOMATROPINE 5-1.5 MG/5ML PO SYRP: 5.0000 mL | ORAL_SOLUTION | Freq: Four times a day (QID) | ORAL | Status: AC | PRN

## 2010-09-27 MED ORDER — OMEPRAZOLE 20 MG PO CPDR: 20.0000 mg | DELAYED_RELEASE_CAPSULE | Freq: Two times a day (BID) | ORAL | Status: DC

## 2010-09-27 NOTE — Progress Notes (Signed)
  Subjective:    Patient ID: [name], female    DOB: 02-25-1966, 45 y.o.   MRN: [id number]  HPI  complains of cough and continued ST Seen recently by me for pharyngitis related to Strep - tx with abx for same - Improved systemic symptoms (resolution of neck swelling and fever) but continued painful swallow in throat and back of tongue - No ear or sinus pressure - no nausea and vomiting but dry cough, esp qhs  Past Medical History  Diagnosis Date  . GLUCOSE INTOLERANCE   . Hypercalcemia   . OBESITY   . COMMON MIGRAINE 04/17/2008  . INSOMNIA 06/02/2009  . HYPERLIPIDEMIA   . HYPERTENSION   . RENAL INSUFFICIENCY   . ALLERGIC RHINITIS      Review of Systems  Cardiovascular: Negative for leg swelling.  Genitourinary: Negative for flank pain.  Musculoskeletal: Negative for back pain.  Neurological: Negative for light-headedness.       Objective:   Physical Exam BP 110/82  Pulse 79  Temp(Src) 98.5 F (36.9 C) (Oral)  Ht 5\' 4"  (1.626 m)  SpO2 97% Physical Exam  Constitutional: She is oriented to person, place, and time. She appears well-developed and well-nourished. No distress.  much less ill than last OV HENT: Head: Normocephalic and atraumatic. Ears; B TMs ok, no erythema or effusion; Nose: Nose normal.  Mouth/Throat: Oropharynx is clear and moist. No oropharyngeal exudate, mild erythema - no vesicles.  Eyes: Conjunctivae and EOM are normal. Pupils are equal, round, and reactive to light. No scleral icterus.  Neck: Normal range of motion. Neck supple. No JVD present. No thyromegaly present.  Cardiovascular: Normal rate, regular rhythm and normal heart sounds.  No murmur heard. No BLE edema. Pulmonary/Chest: Effort normal and breath sounds normal. No respiratory distress. She has no wheezes.      Lab Results  Component Value Date   WBC 7.3 12/04/2009   HGB 13.9 12/04/2009   HCT 40.0 12/04/2009   PLT 216 12/04/2009   CHOL 123 08/24/2010   TRIG 115.0 08/24/2010   HDL 29.80* 08/24/2010   LDLDIRECT 99.8 02/24/2010   ALT 26 08/24/2010   AST 20 08/24/2010   NA 141 07/22/2009   K 3.8 07/22/2009   CL 106 07/22/2009   CREATININE 1.2 07/22/2009   BUN 10 07/22/2009   CO2 31 07/22/2009   TSH 2.00 06/03/2009   HGBA1C 5.7 08/24/2010      Assessment & Plan:  Esophagitis - tx for empiric candida (vuvlovag sx exac by recent abx for strep) and PPI bid x 1 week - no evidence for persisting bacterial infx - if continued pain or problems, pt will call for refer to ENT as needed - erx done

## 2010-09-27 NOTE — Patient Instructions (Signed)
It was good to see you today. Use diflucan and omeprazole x 1 week as discussed for esophagitis causing painful swallow symptoms - also cough medication as needed Your prescription(s) have been submitted to your pharmacy. Please take as directed and contact our office if you believe you are having problem(s) with the medication(s). If symptoms not improved in next 5-7days, call us and we will refer to ENT as discussed (as needed)

## 2010-09-30 ENCOUNTER — Telehealth: Payer: Self-pay | Admitting: *Deleted

## 2010-09-30 NOTE — Telephone Encounter (Signed)
PA is required for Diflucan 150 mg Qty # 7 - Please review directions. Did you want pt to take 1 qd x 7 days? OR 1 once and repeat second dose if needed? Insurance needs further info to cover med w/qty of #7.   If rx is 1 qd x 7 days - What is dx?

## 2010-09-30 NOTE — Telephone Encounter (Signed)
i intended diflucan 150mg  qd x 7 d for tx of esophagitis and vaginitis, presumed candidiasis (112.2, 112.84) - thanks

## 2010-10-01 NOTE — Telephone Encounter (Signed)
Form completed and given to sharon for f/u.

## 2010-10-04 ENCOUNTER — Other Ambulatory Visit: Payer: Self-pay | Admitting: Internal Medicine

## 2010-10-05 NOTE — Telephone Encounter (Signed)
PA form completed and faxed for Approval [10/01/2010]

## 2010-10-06 NOTE — Telephone Encounter (Signed)
I have provided all clinical information available - please resubmit PA and ask for clarification on what additional information is being requested

## 2010-10-06 NOTE — Telephone Encounter (Signed)
Received notice from [facility] that "additional information would be necessary in order to make a decision to approve or deny the request". I have forwarded information: Diflucan 150mg  tablet (Fluconazole)                                           Dx: Candidiasis, Esophagitis, Vaginitis                                        ICD9: 112.2, 112.84                                            Tx: Patient needs 7 days of treatment  I have contacted patient and verified that medication has been p/u and confirmed that patient paid for this out-of-pocket. I do not have any idea as how to move forward for this approval, as patient is wanting to be reimbursed for cost of medication. Please Advise.

## 2010-11-02 ENCOUNTER — Other Ambulatory Visit: Payer: Self-pay | Admitting: Internal Medicine

## 2011-01-05 ENCOUNTER — Other Ambulatory Visit: Payer: Self-pay | Admitting: Internal Medicine

## 2011-03-01 ENCOUNTER — Ambulatory Visit (INDEPENDENT_AMBULATORY_CARE_PROVIDER_SITE_OTHER): Payer: Managed Care, Other (non HMO) | Admitting: Internal Medicine

## 2011-03-01 ENCOUNTER — Encounter: Payer: Self-pay | Admitting: Internal Medicine

## 2011-03-01 DIAGNOSIS — E785 Hyperlipidemia, unspecified: Secondary | ICD-10-CM

## 2011-03-01 DIAGNOSIS — F419 Anxiety disorder, unspecified: Secondary | ICD-10-CM

## 2011-03-01 DIAGNOSIS — I10 Essential (primary) hypertension: Secondary | ICD-10-CM

## 2011-03-01 DIAGNOSIS — F411 Generalized anxiety disorder: Secondary | ICD-10-CM

## 2011-03-01 DIAGNOSIS — E739 Lactose intolerance, unspecified: Secondary | ICD-10-CM

## 2011-03-01 NOTE — Progress Notes (Signed)
  Subjective:    Patient ID: [name], female    DOB: 11-08-1965, 45 y.o.   MRN: [id number]  HPI  here for followup -  HTN - on labetolol with diuretic and norvasc - hx severe HTN in past, no secondary cause identified - follows with renal for same - reports compliance with ongoing medical treatment and no changes in medication dose or frequency. denies adverse side effects related to current therapy.   dyslipidemia - resumed lipitor 12/2009 after miscarriage; also on tricor - reports compliance with ongoing medical treatment - no changes in medication dose or frequency. denies adverse side effects related to current therapy.   glucose intolerance with hx gestational DM 2008; not on meds for same since childbirth 2008 - follows low carb diet and exercise in effort to control weight; +FH same -  Anxiety - much improved since move summer 2012 - on citalopram since 09/2010 - no active symptoms   Past Medical History  Diagnosis Date  . GLUCOSE INTOLERANCE   . Hypercalcemia   . OBESITY   . COMMON MIGRAINE   . INSOMNIA   . HYPERLIPIDEMIA   . HYPERTENSION   . RENAL INSUFFICIENCY   . ALLERGIC RHINITIS     Review of Systems  Constitutional: Negative for fatigue and unexpected weight change.  Respiratory: Negative for cough and shortness of breath.   Cardiovascular: Negative for chest pain and leg swelling.  Neurological: Negative for syncope.  Psychiatric/Behavioral: The patient is not nervous/anxious.        Objective:   Physical ExamBP 112/82  Pulse 74  Temp(Src) 98.2 F (36.8 C) (Oral)  Ht 5\' 4"  (1.626 m)  Wt 207 lb 12.8 oz (94.257 kg)  BMI 35.67 kg/m2  SpO2 97%    Wt Readings from Last 3 Encounters:  03/01/11 207 lb 12.8 oz (94.257 kg)  08/24/10 202 lb 12.8 oz (91.989 kg)  02/24/10 208 lb (94.348 kg)   Constitutional: She is overwight. She appears well-developed and well-nourished. No distress.  Neck: Normal range of motion. Neck supple. No JVD present. No  thyromegaly present.  Cardiovascular: Normal rate, regular rhythm and normal heart sounds.  No murmur heard. No BLE edema. Pulmonary/Chest: Effort normal and breath sounds normal. No respiratory distress. She has no wheezes. Skin: Skin is warm and dry. No rash noted. No erythema.  Psychiatric: She has a normal mood and affect. Her behavior is normal. Judgment and thought content normal.    Lab Results  Component Value Date   WBC 7.3 12/04/2009   HGB 13.9 12/04/2009   HCT 40.0 12/04/2009   PLT 216 12/04/2009   CHOL 123 08/24/2010   TRIG 115.0 08/24/2010   HDL 29.80* 08/24/2010   LDLDIRECT 99.8 02/24/2010   ALT 26 08/24/2010   AST 20 08/24/2010   NA 141 07/22/2009   K 3.8 07/22/2009   CL 106 07/22/2009   CREATININE 1.2 07/22/2009   BUN 10 07/22/2009   CO2 31 07/22/2009   TSH 2.00 06/03/2009   HGBA1C 5.7 08/24/2010    Assessment & Plan:  See problem list. Medications and labs reviewed today.

## 2011-03-01 NOTE — Assessment & Plan Note (Signed)
The current medical regimen is effective;  continue present plan and medications.  

## 2011-03-01 NOTE — Assessment & Plan Note (Signed)
BP Readings from Last 3 Encounters:  03/01/11 112/82  09/27/10 110/82  09/08/10 140/98   The current medical regimen is effective;  continue present plan and medications.

## 2011-03-01 NOTE — Assessment & Plan Note (Signed)
Hx gestational DM 2008, no meds for DM since that time Generally doing well with diet and exercise - need for weight loss reviewed Check annual a1c Lab Results  Component Value Date   HGBA1C 5.7 08/24/2010

## 2011-03-01 NOTE — Assessment & Plan Note (Signed)
Stress induced - started citalopram 08/2010 - symptoms now improved - will wean off same - new rx done to taper off in next 4 weeks To call if hot flash symptoms worse with wean

## 2011-03-01 NOTE — Patient Instructions (Signed)
It was good to see you today. Medications reviewed, will wean off citalopram as discussed- no other changes at this time. Please schedule followup in 6 months for physical and labs, call sooner if problems.

## 2011-04-06 ENCOUNTER — Other Ambulatory Visit: Payer: Self-pay | Admitting: Internal Medicine

## 2011-06-08 ENCOUNTER — Ambulatory Visit (INDEPENDENT_AMBULATORY_CARE_PROVIDER_SITE_OTHER): Payer: Managed Care, Other (non HMO) | Admitting: Internal Medicine

## 2011-06-08 ENCOUNTER — Encounter: Payer: Self-pay | Admitting: Internal Medicine

## 2011-06-08 VITALS — BP 100/72 | HR 87 | Temp 97.3°F

## 2011-06-08 DIAGNOSIS — J0100 Acute maxillary sinusitis, unspecified: Secondary | ICD-10-CM

## 2011-06-08 DIAGNOSIS — B373 Candidiasis of vulva and vagina: Secondary | ICD-10-CM

## 2011-06-08 MED ORDER — AMOXICILLIN-POT CLAVULANATE 875-125 MG PO TABS: 1.0000 | ORAL_TABLET | Freq: Two times a day (BID) | ORAL | Status: AC

## 2011-06-08 MED ORDER — HYDROCODONE-HOMATROPINE 5-1.5 MG/5ML PO SYRP: 5.0000 mL | ORAL_SOLUTION | Freq: Four times a day (QID) | ORAL | Status: AC | PRN

## 2011-06-08 MED ORDER — FLUCONAZOLE 150 MG PO TABS: 150.0000 mg | ORAL_TABLET | Freq: Once | ORAL | Status: AC

## 2011-06-08 NOTE — Progress Notes (Signed)
  Subjective:    HPI  complains of cold symptoms  Onset >6 week ago, wax/wane symptoms  Initially associated with rhinorrhea, sneezing, sore throat, mild headache and low grade fever Also myalgias, sinus pressure and mild-mod chest congestion > productive cough at night No relief with OTC meds Precipitated by sick contacts  Past Medical History  Diagnosis Date  . GLUCOSE INTOLERANCE   . Hypercalcemia   . OBESITY   . COMMON MIGRAINE   . INSOMNIA   . HYPERLIPIDEMIA   . HYPERTENSION   . RENAL INSUFFICIENCY   . ALLERGIC RHINITIS     Review of Systems Constitutional: No night sweats, no unexpected weight change Pulmonary: No pleurisy or hemoptysis Cardiovascular: No chest pain or palpitations     Objective:   Physical Exam BP 100/72  Pulse 87  Temp(Src) 97.3 F (36.3 C) (Oral)  SpO2 98% GEN: mildly ill appearing and audible head/chest congestion HENT: NCAT, mild sinus tenderness bilaterally, nares with thick discharge, oropharynx mild erythema, no exudate Eyes: Vision grossly intact, no conjunctivitis Lungs: Clear to auscultation without rhonchi or wheeze, no increased work of breathing Cardiovascular: Regular rate and rhythm, no bilateral edema      Assessment & Plan:  Viral URI initially> acute maxillary sinusitis Cough, postnasal drip related to above    Empiric antibiotics prescribed due to symptom duration greater than 7 days Prescription cough suppression - new prescriptions done Symptomatic care with Tylenol or Advil, hydration and rest -  salt gargle advised as needed

## 2011-06-08 NOTE — Patient Instructions (Signed)
It was good to see you today. Augmentin antibiotics, Hydromet cough syrup and Diflucan as needed - Your prescription(s) have been submitted to your pharmacy. Please take as directed and contact our office if you believe you are having problem(s) with the medication(s). Stay hydrated, rest and call if symptoms are worse or unimproved in 10-14 days

## 2011-06-13 ENCOUNTER — Other Ambulatory Visit: Payer: Self-pay | Admitting: Internal Medicine

## 2011-06-23 ENCOUNTER — Telehealth: Payer: Self-pay | Admitting: Internal Medicine

## 2011-06-23 NOTE — Telephone Encounter (Signed)
Called pt back she states the last 3 days she has had some bright red blood in her stool. Denies abdominal pain/cramping. No other sxs. She states has not change any of her medicine. Wanting to know she she be concern/worried... 06/23/11@10 :05am/LMB

## 2011-06-23 NOTE — Telephone Encounter (Signed)
Notified pt with md response & recommendations... 06/23/11@2 :03pm/LMB

## 2011-06-23 NOTE — Telephone Encounter (Signed)
The pt called and is hoping to get a call back from [place]. She states she has a question to ask, but did not give any more information.  Thanks!

## 2011-06-23 NOTE — Telephone Encounter (Signed)
Painless bleeding likely related to small hemorrhoid - should resolve spontaneously. If persistent bleeding or pain or increased volume, call for office visit. Thanks

## 2011-07-06 ENCOUNTER — Other Ambulatory Visit: Payer: Self-pay | Admitting: *Deleted

## 2011-07-06 MED ORDER — LABETALOL HCL 200 MG PO TABS: 400.0000 mg | ORAL_TABLET | Freq: Three times a day (TID) | ORAL | Status: DC

## 2011-07-06 MED ORDER — FENOFIBRATE 145 MG PO TABS: 145.0000 mg | ORAL_TABLET | Freq: Every day | ORAL | Status: DC

## 2011-07-06 MED ORDER — ATORVASTATIN CALCIUM 20 MG PO TABS: 20.0000 mg | ORAL_TABLET | Freq: Every day | ORAL | Status: DC

## 2011-07-06 MED ORDER — TRIAMTERENE-HCTZ 37.5-25 MG PO CAPS: 1.0000 | ORAL_CAPSULE | Freq: Every day | ORAL | Status: DC

## 2011-07-15 ENCOUNTER — Other Ambulatory Visit: Payer: Self-pay | Admitting: Internal Medicine

## 2011-08-30 ENCOUNTER — Encounter: Payer: Managed Care, Other (non HMO) | Admitting: Internal Medicine

## 2011-09-06 ENCOUNTER — Encounter: Payer: Managed Care, Other (non HMO) | Admitting: Internal Medicine

## 2011-10-24 ENCOUNTER — Ambulatory Visit (INDEPENDENT_AMBULATORY_CARE_PROVIDER_SITE_OTHER): Payer: Managed Care, Other (non HMO) | Admitting: Internal Medicine

## 2011-10-24 ENCOUNTER — Encounter: Payer: Self-pay | Admitting: Internal Medicine

## 2011-10-24 VITALS — BP 110/80 | HR 86 | Temp 98.6°F | Ht 64.0 in | Wt 206.8 lb

## 2011-10-24 DIAGNOSIS — Z0000 Encounter for general adult medical examination without abnormal findings: Secondary | ICD-10-CM

## 2011-10-24 DIAGNOSIS — J40 Bronchitis, not specified as acute or chronic: Secondary | ICD-10-CM

## 2011-10-24 DIAGNOSIS — E785 Hyperlipidemia, unspecified: Secondary | ICD-10-CM

## 2011-10-24 DIAGNOSIS — M7710 Lateral epicondylitis, unspecified elbow: Secondary | ICD-10-CM

## 2011-10-24 MED ORDER — DOXYCYCLINE HYCLATE 100 MG PO TABS: 100.0000 mg | ORAL_TABLET | Freq: Two times a day (BID) | ORAL | Status: AC

## 2011-10-24 MED ORDER — DICLOFENAC SODIUM 75 MG PO TBEC: 75.0000 mg | DELAYED_RELEASE_TABLET | Freq: Two times a day (BID) | ORAL | Status: AC

## 2011-10-24 NOTE — Assessment & Plan Note (Signed)
The current medical regimen is effective;  continue present plan and medications.  

## 2011-10-24 NOTE — Progress Notes (Signed)
Subjective:    Patient ID: [name], female    DOB: 1965/10/18, 46 y.o.   MRN: [id number]  HPI  patient is here today for annual physical. Patient feels well overall  Cough > 2 weeks - green sputum Poor sleep due to cough. Sick contact (spouse)  Also complains of L elbow pain No trauma or overuse reported Pain with flexion and extension as well as pronation/supination No swelling Not improved with rest  Also reviewed chronic medical issues: hypertension - on labetolol with diuretic and norvasc - hx severe HTN in past, no secondary cause identified - follows with renal for same - reports compliance with ongoing medical treatment and no changes in medication dose or frequency. denies adverse side effects related to current therapy.   dyslipidemia - resumed lipitor 12/2009 after miscarriage; also on tricor - reports compliance with ongoing medical treatment - no changes in medication dose or frequency. denies adverse side effects related to current therapy.   glucose intolerance with hx gestational DM 2008; not on meds for same since childbirth 2008 - follows low carb diet and exercise in effort to control weight; +FH same -  Anxiety - much improved since move summer 2012 - on citalopram since 09/2010 - no active symptoms   Past Medical History  Diagnosis Date  . GLUCOSE INTOLERANCE   . Hypercalcemia   . OBESITY   . COMMON MIGRAINE   . INSOMNIA   . HYPERLIPIDEMIA   . HYPERTENSION   . RENAL INSUFFICIENCY   . ALLERGIC RHINITIS    Family History  Problem Relation Age of Onset  . Diabetes Maternal Grandfather    History  Substance Use Topics  . Smoking status: Former Smoker    Quit date: 05/09/2005  . Smokeless tobacco: Not on file   Comment: Married, lives with spouse and son. 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 Alcohol Use: Yes     social    Review of Systems Constitutional: Negative for fever or weight change.  Respiratory:  Negative for cough and shortness of breath.   Cardiovascular: Negative for chest pain or palpitations.  Gastrointestinal: Negative for abdominal pain, no bowel changes.  Musculoskeletal: Negative for gait problem or joint swelling.  Skin: Negative for rash.  Neurological: Negative for dizziness or headache.  No other specific complaints in a complete review of systems (except as listed in HPI above).     Objective:   Physical Exam BP 110/80  Pulse 86  Temp 98.6 F (37 C) (Oral)  Ht 5\' 4"  (1.626 m)  Wt 206 lb 12.8 oz (93.804 kg)  BMI 35.50 kg/m2  SpO2 97%    Wt Readings from Last 3 Encounters:  10/24/11 206 lb 12.8 oz (93.804 kg)  03/01/11 207 lb 12.8 oz (94.257 kg)  08/24/10 202 lb 12.8 oz (91.989 kg)   Constitutional: [name] is overweight, appears well-developed and well-nourished. No distress. son at side HENT: Head: Normocephalic and atraumatic. Ears: B TMs ok, no erythema or effusion; Nose: Nose normal. Mouth/Throat: Oropharynx is clear and moist. No PND or oropharyngeal exudate.  Eyes: Conjunctivae and EOM are normal. Pupils are equal, round, and reactive to light. No scleral icterus.  Neck: thick, Normal range of motion. Neck supple. No JVD present. No thyromegaly present.  Cardiovascular: Normal rate, regular rhythm and normal heart sounds.  No murmur heard. No BLE edema. Pulmonary/Chest: Effort normal and breath sounds with few scattered rhonchi. No respiratory distress. [name] has no wheezes.  Abdominal:  Soft. Bowel sounds are normal. [name] exhibits no distension. There is no tenderness. no masses Musculoskeletal: L lateral epicondylitis - Normal range of motion, no joint effusions. No gross deformities Neurological: [name] is alert and oriented to person, place, and time. No cranial nerve deficit. Coordination normal.  Skin: small lipoma - right side of t-spine - other skin is warm and dry. No rash noted. No erythema.  Psychiatric: [name] has a normal mood and affect. Her behavior is  normal. Judgment and thought content normal.    Lab Results  Component Value Date   WBC 7.3 12/04/2009   HGB 13.9 12/04/2009   HCT 40.0 12/04/2009   PLT 216 12/04/2009   CHOL 123 08/24/2010   TRIG 115.0 08/24/2010   HDL 29.80* 08/24/2010   LDLDIRECT 99.8 02/24/2010   ALT 26 08/24/2010   AST 20 08/24/2010   NA 141 07/22/2009   K 3.8 07/22/2009   CL 106 07/22/2009   CREATININE 1.2 07/22/2009   BUN 10 07/22/2009   CO2 31 07/22/2009   TSH 2.00 06/03/2009   HGBA1C 5.7 08/24/2010   ECG: sinus @ 81bpm - poor R wave progression  Assessment & Plan:   CPX/v70.0 - Patient has been counseled on age-appropriate routine health concerns for screening and prevention. These are reviewed and up-to-date. Immunizations are up-to-date or declined. Labs and ECG reviewed.  L lateral epicondylitis - voltaren and education provided including home exercises- call for refer to hand of worse or unimproved  Bronchitis - doxy - cough suppression and continue allergic rhinitis meds

## 2011-10-24 NOTE — Patient Instructions (Signed)
It was good to see you today. Health Maintenance reviewed - all recommended immunizations and age-appropriate screenings are up-to-date. Test(s) ordered today. Your results will be called to you after review (48-72hours after test completion). If any changes need to be made, you will be notified at that time. Doxy antibiotics for bronchitis and generic voltaren for elbow pain - Your prescription(s) have been submitted to your pharmacy. Please take as directed and contact our office if you believe you are having problem(s) with the medication(s). Other Medications reviewed, no changes at this time. Please schedule followup in 6 months for blood pressure check and med review, call sooner if problems. 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 Although the condition is often called tennis or golfer's elbow, it is caused by any repetitive action performed by your elbow. HOME CARE INSTRUCTIONS  If the condition has been short lived, rest may be the only treatment required. Using your opposite hand or arm to perform the task may help. Even changing your grip may help rest the extremity. These may even prevent the condition from recurring.   Longer standing problems, however, will often be relieved faster by:   Using anti-inflammatory agents.   Applying ice packs for 30 minutes at the end of the working day, at bed time, or when activities are finished.   Your caregiver may also have you wear a splint or sling. This will allow the inflamed tendon to heal.  At times, steroid injections aided with a local anesthetic will be required along with splinting for 1 to 2 weeks. Two to three steroid injections will often solve the problem. In some long standing cases, the inflamed tendon does not respond to conservative (non-surgical)  therapy. Then surgery may be required to repair it. MAKE SURE YOU:    Understand these instructions.   Will watch your condition.   Will get help right away if you are not doing well or get worse.  Document Released: 04/25/2005 Document Revised: 04/14/2011 Document Reviewed: 12/12/2007 [facility] Patient Information 2012 [place], [place].

## 2012-01-19 ENCOUNTER — Other Ambulatory Visit: Payer: Self-pay | Admitting: Internal Medicine

## 2012-02-13 ENCOUNTER — Other Ambulatory Visit: Payer: Self-pay | Admitting: Internal Medicine

## 2012-02-16 ENCOUNTER — Other Ambulatory Visit (INDEPENDENT_AMBULATORY_CARE_PROVIDER_SITE_OTHER): Payer: Managed Care, Other (non HMO)

## 2012-02-16 DIAGNOSIS — Z0000 Encounter for general adult medical examination without abnormal findings: Secondary | ICD-10-CM

## 2012-02-16 LAB — CBC WITH DIFFERENTIAL/PLATELET
Basophils Relative: 0.7 % (ref 0.0–3.0)
Eosinophils Relative: 8 % — ABNORMAL HIGH (ref 0.0–5.0)
HCT: 40.5 % (ref 36.0–46.0)
MCV: 92.4 fl (ref 78.0–100.0)
Monocytes Absolute: 0.7 10*3/uL (ref 0.1–1.0)
Monocytes Relative: 10.5 % (ref 3.0–12.0)
Neutrophils Relative %: 42.4 % — ABNORMAL LOW (ref 43.0–77.0)
RBC: 4.38 Mil/uL (ref 3.87–5.11)
WBC: 6.2 10*3/uL (ref 4.5–10.5)

## 2012-02-16 LAB — URINALYSIS, ROUTINE W REFLEX MICROSCOPIC
Urine Glucose: NEGATIVE
pH: 7 (ref 5.0–8.0)

## 2012-02-16 LAB — TSH: TSH: 1.39 u[IU]/mL (ref 0.35–5.50)

## 2012-02-16 LAB — BASIC METABOLIC PANEL
BUN: 13 mg/dL (ref 6–23)
CO2: 30 mEq/L (ref 19–32)
Calcium: 9.2 mg/dL (ref 8.4–10.5)
Chloride: 103 mEq/L (ref 96–112)
Creatinine, Ser: 1.2 mg/dL (ref 0.4–1.2)
GFR: 53.1 mL/min — ABNORMAL LOW (ref >60.00)
Glucose, Bld: 100 mg/dL — ABNORMAL HIGH (ref 70–99)
Potassium: 3.6 mEq/L (ref 3.5–5.1)
Sodium: 140 mEq/L (ref 135–145)

## 2012-02-16 LAB — HEPATIC FUNCTION PANEL
Albumin: 3.9 g/dL (ref 3.5–5.2)
Total Protein: 6.8 g/dL (ref 6.0–8.3)

## 2012-02-16 LAB — LIPID PANEL
Cholesterol: 130 mg/dL (ref 0–200)
HDL: 28.1 mg/dL — ABNORMAL LOW (ref >39.00)
LDL Cholesterol: 69 mg/dL (ref 0–99)
Total CHOL/HDL Ratio: 5
Triglycerides: 165 mg/dL — ABNORMAL HIGH (ref 0.0–149.0)
VLDL: 33 mg/dL (ref 0.0–40.0)

## 2012-04-23 ENCOUNTER — Ambulatory Visit (INDEPENDENT_AMBULATORY_CARE_PROVIDER_SITE_OTHER): Payer: Managed Care, Other (non HMO) | Admitting: Internal Medicine

## 2012-04-23 ENCOUNTER — Encounter: Payer: Self-pay | Admitting: Internal Medicine

## 2012-04-23 VITALS — BP 100/72 | HR 73 | Temp 98.3°F | Ht 64.0 in | Wt 213.8 lb

## 2012-04-23 DIAGNOSIS — F419 Anxiety disorder, unspecified: Secondary | ICD-10-CM

## 2012-04-23 DIAGNOSIS — F411 Generalized anxiety disorder: Secondary | ICD-10-CM

## 2012-04-23 DIAGNOSIS — I10 Essential (primary) hypertension: Secondary | ICD-10-CM

## 2012-04-23 DIAGNOSIS — E785 Hyperlipidemia, unspecified: Secondary | ICD-10-CM

## 2012-04-23 DIAGNOSIS — J019 Acute sinusitis, unspecified: Secondary | ICD-10-CM

## 2012-04-23 MED ORDER — CITALOPRAM HYDROBROMIDE 10 MG PO TABS: 10.0000 mg | ORAL_TABLET | Freq: Every day | ORAL | Status: DC

## 2012-04-23 MED ORDER — AZITHROMYCIN 250 MG PO TABS: ORAL_TABLET | ORAL | Status: DC

## 2012-04-23 MED ORDER — PROMETHAZINE-PHENYLEPHRINE 6.25-5 MG/5ML PO SYRP: 5.0000 mL | ORAL_SOLUTION | ORAL | Status: DC | PRN

## 2012-04-23 NOTE — Assessment & Plan Note (Signed)
BP Readings from Last 3 Encounters:  04/23/12 100/72  10/24/11 110/80  06/08/11 100/72   The current medical regimen is effective;  continue present plan and medications.

## 2012-04-23 NOTE — Progress Notes (Signed)
Subjective:    Patient ID: [name], female    DOB: 1965-10-10, 46 y.o.   MRN: [id number]  HPI  patient is here for follow up - reviewed chronic medical issues:  hypertension - on labetolol with diuretic and norvasc - hx severe HTN in past, no secondary cause identified - also follows with renal for same - reports compliance with ongoing medical treatment and no changes in medication dose or frequency. denies adverse side effects related to current therapy.   dyslipidemia - resumed lipitor 12/2009 after miscarriage; also on tricor - reports compliance with ongoing medical treatment - no changes in medication dose or frequency. denies adverse side effects related to current therapy.   glucose intolerance with hx gestational DM 2008; not on meds for same since childbirth 2008 - follows low carb diet and exercise in effort to control weight; +FH same -  Anxiety - much improved since move summer 2012 - on citalopram since 09/2010 - no active symptoms   Also complains of sinus flare  Past Medical History  Diagnosis Date  . GLUCOSE INTOLERANCE   . Hypercalcemia   . OBESITY   . COMMON MIGRAINE   . INSOMNIA   . HYPERLIPIDEMIA   . HYPERTENSION   . RENAL INSUFFICIENCY   . ALLERGIC RHINITIS     Review of Systems  Constitutional: Negative for fever or weight change.  Respiratory: Negative for shortness of breath.   Cardiovascular: Negative for chest pain or palpitations.       Objective:   Physical Exam BP 100/72  Pulse 73  Temp 98.3 F (36.8 C) (Oral)  Ht 5\' 4"  (1.626 m)  Wt 213 lb 12.8 oz (96.979 kg)  BMI 36.70 kg/m2  SpO2 98%    Wt Readings from Last 3 Encounters:  04/23/12 213 lb 12.8 oz (96.979 kg)  10/24/11 206 lb 12.8 oz (93.804 kg)  03/01/11 207 lb 12.8 oz (94.257 kg)   Constitutional: She is overweight, appears well-developed and well-nourished. No distress. son at side HENT: Head: Normocephalic and atraumatic. Sinus mild tender to palpation -Ears: B TMs  ok, no erythema or effusion; Nose: swollen turbinated with thick discharge, no ulceration. Mouth/Throat: Oropharynx is clear and moist. No PND or oropharyngeal exudate.  Eyes: Conjunctivae and EOM are normal. Pupils are equal, round, and reactive to light. No scleral icterus.  Neck: thick, Normal range of motion. Neck supple. No JVD present. No thyromegaly present.  Cardiovascular: Normal rate, regular rhythm and normal heart sounds.  No murmur heard. No BLE edema. Pulmonary/Chest: Effort normal and breath sounds with few scattered rhonchi. No respiratory distress. She has no wheezes. Neurological: She is alert and oriented to person, place, and time. No cranial nerve deficit. Coordination normal.  Psychiatric: She has a normal mood and affect. Her behavior is normal. Judgment and thought content normal.    Lab Results  Component Value Date   WBC 6.2 02/16/2012   HGB 13.6 02/16/2012   HCT 40.5 02/16/2012   PLT 248.0 02/16/2012   CHOL 130 02/16/2012   TRIG 165.0* 02/16/2012   HDL 28.10* 02/16/2012   LDLDIRECT 99.8 02/24/2010   ALT 40* 02/16/2012   AST 32 02/16/2012   NA 140 02/16/2012   K 3.6 02/16/2012   CL 103 02/16/2012   CREATININE 1.2 02/16/2012   BUN 13 02/16/2012   CO2 30 02/16/2012   TSH 1.39 02/16/2012   HGBA1C 5.7 08/24/2010    Assessment & Plan:   See problem list. Medications and labs reviewed today.  Sinusitis, acute - Zpak and decongestants + antihistamine - erx done; symptomatic care also advised

## 2012-04-23 NOTE — Patient Instructions (Signed)
It was good to see you today. Resume citalopram for "stress" and anxiety Use Zpak antibiotics and decongestant syrup at night for sinus symptoms - Your prescription(s) have been submitted to your pharmacy. Please take as directed and contact our office if you believe you are having problem(s) with the medication(s). Other Medications reviewed, no changes at this time. Please schedule followup in 6 months for blood pressure and cholesterol check, call sooner if problems.

## 2012-04-23 NOTE — Assessment & Plan Note (Signed)
Stress induced - started citalopram 08/2010 - wean off same 02/2011 when symptoms improved Resume now - Verified no SI/HI

## 2012-04-23 NOTE — Assessment & Plan Note (Signed)
On statin + Tricor - last lipids reviewed The current medical regimen is effective;  continue present plan and medications.

## 2012-04-27 ENCOUNTER — Other Ambulatory Visit: Payer: Self-pay | Admitting: *Deleted

## 2012-04-27 MED ORDER — LABETALOL HCL 200 MG PO TABS: 400.0000 mg | ORAL_TABLET | Freq: Three times a day (TID) | ORAL | Status: DC

## 2012-04-27 MED ORDER — ATORVASTATIN CALCIUM 20 MG PO TABS: 20.0000 mg | ORAL_TABLET | Freq: Every day | ORAL | Status: DC

## 2012-04-27 MED ORDER — FENOFIBRATE 145 MG PO TABS: 145.0000 mg | ORAL_TABLET | Freq: Every day | ORAL | Status: DC

## 2012-06-18 ENCOUNTER — Other Ambulatory Visit: Payer: Self-pay | Admitting: *Deleted

## 2012-06-18 MED ORDER — CITALOPRAM HYDROBROMIDE 10 MG PO TABS: 10.0000 mg | ORAL_TABLET | Freq: Every day | ORAL | Status: DC

## 2012-07-25 ENCOUNTER — Other Ambulatory Visit: Payer: Self-pay | Admitting: Internal Medicine

## 2012-10-02 ENCOUNTER — Other Ambulatory Visit: Payer: Self-pay | Admitting: *Deleted

## 2012-10-02 MED ORDER — CITALOPRAM HYDROBROMIDE 10 MG PO TABS: 10.0000 mg | ORAL_TABLET | Freq: Every day | ORAL | Status: DC

## 2012-10-18 ENCOUNTER — Other Ambulatory Visit: Payer: Self-pay | Admitting: Internal Medicine

## 2012-10-30 ENCOUNTER — Encounter: Payer: Managed Care, Other (non HMO) | Admitting: Internal Medicine

## 2012-10-31 ENCOUNTER — Ambulatory Visit (INDEPENDENT_AMBULATORY_CARE_PROVIDER_SITE_OTHER): Payer: Managed Care, Other (non HMO) | Admitting: Internal Medicine

## 2012-10-31 ENCOUNTER — Encounter: Payer: Self-pay | Admitting: Internal Medicine

## 2012-10-31 ENCOUNTER — Other Ambulatory Visit (INDEPENDENT_AMBULATORY_CARE_PROVIDER_SITE_OTHER): Payer: Managed Care, Other (non HMO)

## 2012-10-31 VITALS — BP 120/90 | HR 77 | Temp 98.3°F | Wt 215.8 lb

## 2012-10-31 DIAGNOSIS — Z0000 Encounter for general adult medical examination without abnormal findings: Secondary | ICD-10-CM

## 2012-10-31 DIAGNOSIS — Z1239 Encounter for other screening for malignant neoplasm of breast: Secondary | ICD-10-CM

## 2012-10-31 DIAGNOSIS — E669 Obesity, unspecified: Secondary | ICD-10-CM

## 2012-10-31 LAB — CBC WITH DIFFERENTIAL/PLATELET
Basophils Relative: 0.5 % (ref 0.0–3.0)
Eosinophils Absolute: 0.4 10*3/uL (ref 0.0–0.7)
Eosinophils Relative: 5.4 % — ABNORMAL HIGH (ref 0.0–5.0)
Hemoglobin: 14.5 g/dL (ref 12.0–15.0)
Lymphocytes Relative: 32 % (ref 12.0–46.0)
MCHC: 34.6 g/dL (ref 30.0–36.0)
Monocytes Relative: 9.1 % (ref 3.0–12.0)
Neutro Abs: 4 10*3/uL (ref 1.4–7.7)
RBC: 4.56 Mil/uL (ref 3.87–5.11)

## 2012-10-31 LAB — BASIC METABOLIC PANEL
CO2: 29 mEq/L (ref 19–32)
Calcium: 9.9 mg/dL (ref 8.4–10.5)
Sodium: 137 mEq/L (ref 135–145)

## 2012-10-31 LAB — URINALYSIS, ROUTINE W REFLEX MICROSCOPIC
Ketones, ur: NEGATIVE
RBC / HPF: NONE SEEN (ref 0–?)
Specific Gravity, Urine: 1.015 (ref 1.000–1.030)
Total Protein, Urine: NEGATIVE
Urine Glucose: NEGATIVE
pH: 7.5 (ref 5.0–8.0)

## 2012-10-31 LAB — LIPID PANEL
Cholesterol: 195 mg/dL (ref 0–200)
Total CHOL/HDL Ratio: 6
Triglycerides: 292 mg/dL — ABNORMAL HIGH (ref 0.0–149.0)
VLDL: 58.4 mg/dL — ABNORMAL HIGH (ref 0.0–40.0)

## 2012-10-31 LAB — LDL CHOLESTEROL, DIRECT: Direct LDL: 124.5 mg/dL

## 2012-10-31 LAB — HEPATIC FUNCTION PANEL
AST: 24 U/L (ref 0–37)
Albumin: 4.5 g/dL (ref 3.5–5.2)

## 2012-10-31 NOTE — Progress Notes (Signed)
Subjective:    Patient ID: [name], female    DOB: 1966/02/23, 47 y.o.   MRN: [id number]  HPI  patient is here today for annual physical. Patient feels well overall - concerned about weight gain  Also reviewed chronic medical issues: hypertension - on labetolol with diuretic and norvasc - hx severe HTN in past, no secondary cause identified - follows with renal for same - reports compliance with ongoing medical treatment and no changes in medication dose or frequency. denies adverse side effects related to current therapy.   dyslipidemia - resumed lipitor 12/2009 after miscarriage; also on tricor - reports compliance with ongoing medical treatment - no changes in medication dose or frequency. denies adverse side effects related to current therapy.   glucose intolerance with hx gestational DM 2008; not on meds for same since childbirth 2008 - follows low carb diet and exercise in effort to control weight; +FH same -  Anxiety - much improved since move summer 2012 - on citalopram since 09/2010 - no active symptoms   Past Medical History  Diagnosis Date  . GLUCOSE INTOLERANCE   . Hypercalcemia   . OBESITY   . COMMON MIGRAINE   . INSOMNIA   . HYPERLIPIDEMIA   . HYPERTENSION   . RENAL INSUFFICIENCY   . ALLERGIC RHINITIS    Family History  Problem Relation Age of Onset  . Diabetes Maternal Grandfather    History  Substance Use Topics  . Smoking status: Former Smoker    Quit date: 05/09/2005  . Smokeless tobacco: Not on file     Comment: Married, lives with spouse and son. 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 Alcohol Use: Yes     Comment: social    Review of Systems  Constitutional: Negative for fever or weight change.  Respiratory: Negative for cough and shortness of breath.   Cardiovascular: Negative for chest pain or palpitations.  Gastrointestinal: Negative for abdominal pain, no bowel changes.  Musculoskeletal: Negative for gait  problem or joint swelling.  Skin: Negative for rash.  Neurological: Negative for dizziness or headache.  No other specific complaints in a complete review of systems (except as listed in HPI above).     Objective:   Physical Exam BP 120/90  Pulse 77  Temp(Src) 98.3 F (36.8 C) (Oral)  Wt 215 lb 12.8 oz (97.886 kg)  BMI 37.02 kg/m2  SpO2 97%    Wt Readings from Last 3 Encounters:  10/31/12 215 lb 12.8 oz (97.886 kg)  04/23/12 213 lb 12.8 oz (96.979 kg)  10/24/11 206 lb 12.8 oz (93.804 kg)   Constitutional: She is overweight, appears well-developed and well-nourished. No distress.  HENT: Head: Normocephalic and atraumatic. Ears: B TMs ok, no erythema or effusion; Nose: Nose normal. Mouth/Throat: Oropharynx is clear and moist. No PND or oropharyngeal exudate.  Eyes: Conjunctivae and EOM are normal. Pupils are equal, round, and reactive to light. No scleral icterus.  Neck: thick, Normal range of motion. Neck supple. No JVD present. No thyromegaly present.  Cardiovascular: Normal rate, regular rhythm and normal heart sounds.  No murmur heard. No BLE edema. Pulmonary/Chest: Effort normal and breath sounds with few scattered rhonchi. No respiratory distress. She has no wheezes.  Abdominal: Soft. Bowel sounds are normal. She exhibits no distension. There is no tenderness. no masses Musculoskeletal:  Normal range of motion, no joint effusions. No gross deformities Neurological: She is alert and oriented to person, place, and time. No cranial nerve deficit.  Coordination normal.  Skin: small lipoma - right side of t-spine - other skin is warm and dry. No rash noted. No erythema.  Psychiatric: She has a normal mood and affect. Her behavior is normal. Judgment and thought content normal.    Lab Results  Component Value Date   WBC 6.2 02/16/2012   HGB 13.6 02/16/2012   HCT 40.5 02/16/2012   PLT 248.0 02/16/2012   CHOL 130 02/16/2012   TRIG 165.0* 02/16/2012   HDL 28.10* 02/16/2012    LDLDIRECT 99.8 02/24/2010   ALT 40* 02/16/2012   AST 32 02/16/2012   NA 140 02/16/2012   K 3.6 02/16/2012   CL 103 02/16/2012   CREATININE 1.2 02/16/2012   BUN 13 02/16/2012   CO2 30 02/16/2012   TSH 1.39 02/16/2012   HGBA1C 5.7 08/24/2010   ECG: sinus @ 72bpm - poor R wave progression, no acute changes. Unchanged from June 2013  Assessment & Plan:   CPX/v70.0 - Patient has been counseled on age-appropriate routine health concerns for screening and prevention. These are reviewed and up-to-date. Immunizations are up-to-date or declined. Labs and ECG reviewed.

## 2012-10-31 NOTE — Patient Instructions (Signed)
It was good to see you today. Health Maintenance reviewed - call for PAP with gynecologist, will refer for mammogram screening - all other recommended immunizations and age-appropriate screenings are up-to-date. Test(s) ordered today. Your results will be called to you after review (48-72hours after test completion). If any changes need to be made, you will be notified at that time. Medications reviewed, no changes at this time. Please schedule followup in 6 months for blood pressure check and med review, call sooner if problems. Work on lifestyle changes as discussed (low carb, increased protein diet; improved exercise efforts; weight loss) to control sugar, blood pressure and cholesterol levels and/or reduce risk of developing other medical problems. Look into [email or web address].[email or web address] or other type of food journal to assist you in this process.   Health Maintenance, Females A healthy lifestyle and preventative care can promote health and wellness.  Maintain regular health, dental, and eye exams.  Eat a healthy diet. Foods like vegetables, fruits, whole grains, low-fat dairy products, and lean protein foods contain the nutrients you need without too many calories. Decrease your intake of foods high in solid fats, added sugars, and salt. Get information about a proper diet from your caregiver, if necessary.  Regular physical exercise is one of the most important things you can do for your health. Most adults should get at least 150 minutes of moderate-intensity exercise (any activity that increases your heart rate and causes you to sweat) each week. In addition, most adults need muscle-strengthening exercises on 2 or more days a week.   Maintain a healthy weight. The body mass index (BMI) is a screening tool to identify possible weight problems. It provides an estimate of body fat based on height and weight. Your caregiver can help determine your BMI, and can help you achieve or maintain a healthy weight.  For adults 20 years and older:  A BMI below 18.5 is considered underweight.  A BMI of 18.5 to 24.9 is normal.  A BMI of 25 to 29.9 is considered overweight.  A BMI of 30 and above is considered obese.  Maintain normal blood lipids and cholesterol by exercising and minimizing your intake of saturated fat. Eat a balanced diet with plenty of fruits and vegetables. Blood tests for lipids and cholesterol should begin at age 72 and be repeated every 5 years. If your lipid or cholesterol levels are high, you are over 50, or you are a high risk for heart disease, you may need your cholesterol levels checked more frequently.Ongoing high lipid and cholesterol levels should be treated with medicines if diet and exercise are not effective.  If you smoke, find out from your caregiver how to quit. If you do not use tobacco, do not start.  If you are pregnant, do not drink alcohol. If you are breastfeeding, be very cautious about drinking alcohol. If you are not pregnant and choose to drink alcohol, do not exceed 1 drink per day. One drink is considered to be 12 ounces (355 mL) of beer, 5 ounces (148 mL) of wine, or 1.5 ounces (44 mL) of liquor.  Avoid use of street drugs. Do not share needles with anyone. Ask for help if you need support or instructions about stopping the use of drugs.  High blood pressure causes heart disease and increases the risk of stroke. Blood pressure should be checked at least every 1 to 2 years. Ongoing high blood pressure should be treated with medicines, if weight loss and exercise are not effective.  If you  are 58 to 46 years old, ask your caregiver if you should take aspirin to prevent strokes.  Diabetes screening involves taking a blood sample to check your fasting blood sugar level. This should be done once every 3 years, after age 16, if you are within normal weight and without risk factors for diabetes. Testing should be considered at a younger age or be carried out more  frequently if you are overweight and have at least 1 risk factor for diabetes.  Breast cancer screening is essential preventative care for women. You should practice "breast self-awareness." This means understanding the normal appearance and feel of your breasts and may include breast self-examination. Any changes detected, no matter how small, should be reported to a caregiver. Women in their 2s and 30s should have a clinical breast exam (CBE) by a caregiver as part of a regular health exam every 1 to 3 years. After age 43, women should have a CBE every year. Starting at age 62, women should consider having a mammogram (breast X-ray) every year. Women who have a family history of breast cancer should talk to their caregiver about genetic screening. Women at a high risk of breast cancer should talk to their caregiver about having an MRI and a mammogram every year.  The Pap test is a screening test for cervical cancer. Women should have a Pap test starting at age 72. Between ages 36 and 70, Pap tests should be repeated every 2 years. Beginning at age 67, you should have a Pap test every 3 years as long as the past 3 Pap tests have been normal. If you had a hysterectomy for a problem that was not cancer or a condition that could lead to cancer, then you no longer need Pap tests. If you are between ages 62 and 38, and you have had normal Pap tests going back 10 years, you no longer need Pap tests. If you have had past treatment for cervical cancer or a condition that could lead to cancer, you need Pap tests and screening for cancer for at least 20 years after your treatment. If Pap tests have been discontinued, risk factors (such as a new sexual partner) need to be reassessed to determine if screening should be resumed. Some women have medical problems that increase the chance of getting cervical cancer. In these cases, your caregiver may recommend more frequent screening and Pap tests.  The human papillomavirus  (HPV) test is an additional test that may be used for cervical cancer screening. The HPV test looks for the virus that can cause the cell changes on the cervix. The cells collected during the Pap test can be tested for HPV. The HPV test could be used to screen women aged 55 years and older, and should be used in women of any age who have unclear Pap test results. After the age of 67, women should have HPV testing at the same frequency as a Pap test.  Colorectal cancer can be detected and often prevented. Most routine colorectal cancer screening begins at the age of 73 and continues through age 66. However, your caregiver may recommend screening at an earlier age if you have risk factors for colon cancer. On a yearly basis, your caregiver may provide home test kits to check for hidden blood in the stool. Use of a small camera at the end of a tube, to directly examine the colon (sigmoidoscopy or colonoscopy), can detect the earliest forms of colorectal cancer. Talk to your caregiver about  this at age 53, when routine screening begins. Direct examination of the colon should be repeated every 5 to 10 years through age 33, unless early forms of pre-cancerous polyps or small growths are found.  Hepatitis C blood testing is recommended for all people born from [birth year] through 1964 and any individual with known risks for hepatitis C.  Practice safe sex. Use condoms and avoid high-risk sexual practices to reduce the spread of sexually transmitted infections (STIs). Sexually active women aged 27 and younger should be checked for Chlamydia, which is a common sexually transmitted infection. Older women with new or multiple partners should also be tested for Chlamydia. Testing for other STIs is recommended if you are sexually active and at increased risk.  Osteoporosis is a disease in which the bones lose minerals and strength with aging. This can result in serious bone fractures. The risk of osteoporosis can be identified  using a bone density scan. Women ages 31 and over and women at risk for fractures or osteoporosis should discuss screening with their caregivers. Ask your caregiver whether you should be taking a calcium supplement or vitamin D to reduce the rate of osteoporosis.  Menopause can be associated with physical symptoms and risks. Hormone replacement therapy is available to decrease symptoms and risks. You should talk to your caregiver about whether hormone replacement therapy is right for you.  Use sunscreen with a sun protection factor (SPF) of 30 or greater. Apply sunscreen liberally and repeatedly throughout the day. You should seek shade when your shadow is shorter than you. Protect yourself by wearing long sleeves, pants, a wide-brimmed hat, and sunglasses year round, whenever you are outdoors.  Notify your caregiver of new moles or changes in moles, especially if there is a change in shape or color. Also notify your caregiver if a mole is larger than the size of a pencil eraser.  Stay current with your immunizations. Document Released: 11/08/2010 Document Revised: 07/18/2011 Document Reviewed: 11/08/2010 [facility] Patient Information 2014 [place], [place].

## 2012-11-01 ENCOUNTER — Ambulatory Visit: Payer: Managed Care, Other (non HMO)

## 2012-11-01 DIAGNOSIS — R7309 Other abnormal glucose: Secondary | ICD-10-CM

## 2012-11-01 LAB — HEMOGLOBIN A1C: Hgb A1c MFr Bld: 6 % (ref 4.6–6.5)

## 2012-11-01 MED ORDER — ATORVASTATIN CALCIUM 40 MG PO TABS: 40.0000 mg | ORAL_TABLET | Freq: Every day | ORAL | Status: DC

## 2012-11-01 NOTE — Addendum Note (Signed)
Addended by: Rene Paci A on: 11/01/2012 08:17 AM   Modules accepted: Orders, Medications

## 2013-01-16 ENCOUNTER — Other Ambulatory Visit: Payer: Self-pay | Admitting: Internal Medicine

## 2013-04-06 ENCOUNTER — Other Ambulatory Visit: Payer: Self-pay | Admitting: Internal Medicine

## 2013-04-16 ENCOUNTER — Other Ambulatory Visit: Payer: Self-pay | Admitting: Internal Medicine

## 2013-04-16 MED ORDER — LABETALOL HCL 200 MG PO TABS: ORAL_TABLET | ORAL | Status: DC

## 2013-04-16 MED ORDER — FENOFIBRATE 145 MG PO TABS: ORAL_TABLET | ORAL | Status: DC

## 2013-04-16 MED ORDER — ATORVASTATIN CALCIUM 40 MG PO TABS: 40.0000 mg | ORAL_TABLET | Freq: Every day | ORAL | Status: DC

## 2013-04-16 NOTE — Telephone Encounter (Signed)
VAL pt  

## 2013-04-16 NOTE — Addendum Note (Signed)
Addended by: Deatra James on: 04/16/2013 01:37 PM   Modules accepted: Orders

## 2013-04-22 ENCOUNTER — Ambulatory Visit
Admission: RE | Admit: 2013-04-22 | Discharge: 2013-04-22 | Disposition: A | Discharge status: Home / Self Care | Payer: Managed Care, Other (non HMO) | Source: Ambulatory Visit | Attending: Internal Medicine | Admitting: Internal Medicine

## 2013-04-22 DIAGNOSIS — Z1239 Encounter for other screening for malignant neoplasm of breast: Secondary | ICD-10-CM

## 2013-04-24 ENCOUNTER — Ambulatory Visit (INDEPENDENT_AMBULATORY_CARE_PROVIDER_SITE_OTHER): Payer: Managed Care, Other (non HMO) | Admitting: Internal Medicine

## 2013-04-24 ENCOUNTER — Encounter: Payer: Self-pay | Admitting: Internal Medicine

## 2013-04-24 VITALS — BP 130/92 | HR 87 | Temp 98.8°F | Wt 212.0 lb

## 2013-04-24 DIAGNOSIS — E669 Obesity, unspecified: Secondary | ICD-10-CM

## 2013-04-24 DIAGNOSIS — E785 Hyperlipidemia, unspecified: Secondary | ICD-10-CM

## 2013-04-24 DIAGNOSIS — I10 Essential (primary) hypertension: Secondary | ICD-10-CM

## 2013-04-24 NOTE — Progress Notes (Signed)
  Subjective:    Patient ID: [name], female    DOB: [birth year], 47 y.o.   MRN: [id number]  HPI  here for follow up -reviewed chronic medical issues:  hypertension - on labetolol with diuretic and norvasc - hx severe HTN in past, no secondary cause identified - follows with renal for same - reports compliance with ongoing medical treatment and no changes in medication dose or frequency. denies adverse side effects related to current therapy.   dyslipidemia - resumed lipitor 12/2009 after miscarriage; also on tricor - reports compliance with ongoing medical treatment - no changes in medication dose or frequency. denies adverse side effects related to current therapy.   glucose intolerance with hx gestational DM 2008; not on meds for same since childbirth 2008 - follows low carb diet and exercise in effort to control weight; +FH same -  Anxiety - much improved since move summer 2012 - on citalopram since 09/2010 - no active symptoms   Past Medical History  Diagnosis Date  . GLUCOSE INTOLERANCE   . Hypercalcemia   . OBESITY   . COMMON MIGRAINE   . INSOMNIA   . HYPERLIPIDEMIA   . HYPERTENSION   . RENAL INSUFFICIENCY   . ALLERGIC RHINITIS     Review of Systems  Constitutional: Negative for fever, fatigue and unexpected weight change.  Respiratory: Negative for cough and shortness of breath.   Cardiovascular: Negative for chest pain and leg swelling.        Objective:   Physical Exam   BP 130/92  Pulse 87  Temp(Src) 98.8 F (37.1 C) (Oral)  Wt 212 lb (96.163 kg)  SpO2 97%  Wt Readings from Last 3 Encounters:  04/24/13 212 lb (96.163 kg)  10/31/12 215 lb 12.8 oz (97.886 kg)  04/23/12 213 lb 12.8 oz (96.979 kg)   Constitutional: She is overweight, appears well-developed and well-nourished. No distress.  Neck: thick, Normal range of motion. Neck supple. No JVD present. No thyromegaly present.  Cardiovascular: Normal rate, regular rhythm and normal heart sounds.  No  murmur heard. No BLE edema. Pulmonary/Chest: Effort normal and breath sounds with few scattered rhonchi. No respiratory distress. She has no wheezes.  Psychiatric: She has a normal mood and affect. Her behavior is normal. Judgment and thought content normal.    Lab Results  Component Value Date   WBC 7.6 10/31/2012   HGB 14.5 10/31/2012   HCT 42.0 10/31/2012   PLT 249.0 10/31/2012   CHOL 195 10/31/2012   TRIG 292.0* 10/31/2012   HDL 33.60* 10/31/2012   LDLDIRECT 124.5 10/31/2012   ALT 40* 10/31/2012   AST 24 10/31/2012   NA 137 10/31/2012   K 4.1 10/31/2012   CL 101 10/31/2012   CREATININE 1.1 10/31/2012   BUN 15 10/31/2012   CO2 29 10/31/2012   TSH 1.45 10/31/2012   HGBA1C 6.0 11/01/2012     Assessment & Plan:   See problem list. Medications and labs reviewed today.

## 2013-04-24 NOTE — Assessment & Plan Note (Signed)
Wt Readings from Last 3 Encounters:  04/24/13 212 lb (96.163 kg)  10/31/12 215 lb 12.8 oz (97.886 kg)  04/23/12 213 lb 12.8 oz (96.979 kg)   Weight changes reviewed The patient is asked to make an attempt to improve diet and exercise patterns to aid in medical management of this problem.

## 2013-04-24 NOTE — Progress Notes (Signed)
Pre-visit discussion using our clinic review tool. No additional management support is needed unless otherwise documented below in the visit note.  

## 2013-04-24 NOTE — Patient Instructions (Addendum)
It was good to see you today.  We have reviewed your prior records including labs and tests today  Medications reviewed and updated, no changes recommended at this time.  Work on lifestyle changes as discussed (low fat, low carb, increased protein diet; improved exercise efforts; weight loss) to control sugar, blood pressure and cholesterol levels and/or reduce risk of developing other medical problems. Look into [email or web address].com.cy or other type of food journal to assist you in this process.  Please schedule followup in 6 months, call sooner if problems.  Hypertension Hypertension is another name for high blood pressure. High blood pressure may mean that your heart needs to work harder to pump blood. Blood pressure consists of two numbers, which includes a higher number over a lower number (example: 110/72). HOME CARE   Make lifestyle changes as told by your doctor. This may include weight loss and exercise.  Take your blood pressure medicine every day.  Limit how much salt you use.  Stop smoking if you smoke.  Do not use drugs.  Talk to your doctor if you are using decongestants or birth control pills. These medicines might make blood pressure higher.  Females should not drink more than 1 alcoholic drink per day. Males should not drink more than 2 alcoholic drinks per day.  See your doctor as told. GET HELP RIGHT AWAY IF:   You have a blood pressure reading with a top number of 180 or higher.  You get a very bad headache.  You get blurred or changing vision.  You feel confused.  You feel weak, numb, or faint.  You get chest or belly (abdominal) pain.  You throw up (vomit).  You cannot breathe very well. MAKE SURE YOU:   Understand these instructions.  Will watch your condition.  Will get help right away if you are not doing well or get worse. Document Released: 10/12/2007 Document Revised: 07/18/2011 Document Reviewed: 10/12/2007 [facility] Patient Information  2014 [place], [place]. Exercise to Lose Weight Exercise and a healthy diet may help you lose weight. Your doctor may suggest specific exercises. EXERCISE IDEAS AND TIPS  Choose low-cost things you enjoy doing, such as walking, bicycling, or exercising to workout videos.  Take stairs instead of the elevator.  Walk during your lunch break.  Park your car further away from work or school.  Go to a gym or an exercise class.  Start with 5 to 10 minutes of exercise each day. Build up to 30 minutes of exercise 4 to 6 days a week.  Wear shoes with good support and comfortable clothes.  Stretch before and after working out.  Work out until you breathe harder and your heart beats faster.  Drink extra water when you exercise.  Do not do so much that you hurt yourself, feel dizzy, or get very short of breath. Exercises that burn about 150 calories:  Running 1  miles in 15 minutes.  Playing volleyball for 45 to 60 minutes.  Washing and waxing a car for 45 to 60 minutes.  Playing touch football for 45 minutes.  Walking 1  miles in 35 minutes.  Pushing a stroller 1  miles in 30 minutes.  Playing basketball for 30 minutes.  Raking leaves for 30 minutes.  Bicycling 5 miles in 30 minutes.  Walking 2 miles in 30 minutes.  Dancing for 30 minutes.  Shoveling snow for 15 minutes.  Swimming laps for 20 minutes.  Walking up stairs for 15 minutes.  Bicycling 4 miles in 15  minutes.  Gardening for 30 to 45 minutes.  Jumping rope for 15 minutes.  Washing windows or floors for 45 to 60 minutes. Document Released: 05/28/2010 Document Revised: 07/18/2011 Document Reviewed: 05/28/2010 [facility] Patient Information 2014 [place], [place].

## 2013-04-24 NOTE — Assessment & Plan Note (Signed)
BP Readings from Last 3 Encounters:  04/24/13 130/92  10/31/12 120/90  04/23/12 100/72   The current medical regimen is effective;  continue present plan and medications.

## 2013-04-24 NOTE — Assessment & Plan Note (Signed)
On statin + Tricor - last lipids reviewed lipitor dose increased 10/2012-  recheck now now and titrate as needed

## 2013-05-21 ENCOUNTER — Other Ambulatory Visit: Payer: Self-pay | Admitting: Internal Medicine

## 2013-05-21 MED ORDER — OSELTAMIVIR PHOSPHATE 75 MG PO CAPS: 75.0000 mg | ORAL_CAPSULE | Freq: Two times a day (BID) | ORAL | Status: DC

## 2013-05-21 NOTE — Telephone Encounter (Signed)
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

## 2013-06-04 ENCOUNTER — Encounter: Payer: Self-pay | Admitting: Internal Medicine

## 2013-06-04 ENCOUNTER — Ambulatory Visit (INDEPENDENT_AMBULATORY_CARE_PROVIDER_SITE_OTHER): Payer: Managed Care, Other (non HMO) | Admitting: Internal Medicine

## 2013-06-04 VITALS — BP 130/84 | HR 76 | Temp 98.5°F | Resp 16 | Wt 215.0 lb

## 2013-06-04 DIAGNOSIS — H109 Unspecified conjunctivitis: Secondary | ICD-10-CM | POA: Insufficient documentation

## 2013-06-04 MED ORDER — POLYMYXIN B-TRIMETHOPRIM 10000-0.1 UNIT/ML-% OP SOLN: 2.0000 [drp] | Freq: Four times a day (QID) | OPHTHALMIC | Status: DC

## 2013-06-04 NOTE — Progress Notes (Signed)
Pre visit review using our clinic review tool, if applicable. No additional management support is needed unless otherwise documented below in the visit note. 

## 2013-06-04 NOTE — Progress Notes (Signed)
   Subjective:    Patient ID: [name], female    DOB: [birth year], 48 y.o.   MRN: [id number]  Conjunctivitis  The current episode started more than 2 weeks ago. The onset was gradual. The problem has been unchanged. The problem is mild. Associated symptoms include eye itching, eye discharge and eye redness. Pertinent negatives include no decreased vision, no double vision, no photophobia, no ear discharge, no headaches, no rhinorrhea, no swollen glands and no eye pain. The eye pain is mild. Both eyes are affected.     Review of Systems  HENT: Negative for ear discharge and rhinorrhea.   Eyes: Positive for discharge, redness and itching. Negative for double vision, photophobia and pain.  Neurological: Negative for headaches.       Objective:   Physical Exam  Constitutional: No distress.  HENT:  Right Ear: External ear normal.  Nose: Nose normal.  Mouth/Throat: Oropharynx is clear and moist. No oropharyngeal exudate.  Eyes: Right eye exhibits discharge. Left eye exhibits discharge. No scleral icterus.  eryth conj B  Cardiovascular: Normal rate and regular rhythm.   No murmur heard. Pulmonary/Chest: No respiratory distress. She has no wheezes. She has no rales.  Skin: She is not diaphoretic.          Assessment & Plan:

## 2013-06-04 NOTE — Assessment & Plan Note (Addendum)
Rx: polytrim eye gtt B eyes Ophth consult if not better

## 2013-10-09 ENCOUNTER — Other Ambulatory Visit: Payer: Self-pay | Admitting: Internal Medicine

## 2013-10-23 ENCOUNTER — Other Ambulatory Visit (INDEPENDENT_AMBULATORY_CARE_PROVIDER_SITE_OTHER): Payer: Managed Care, Other (non HMO)

## 2013-10-23 ENCOUNTER — Ambulatory Visit (INDEPENDENT_AMBULATORY_CARE_PROVIDER_SITE_OTHER): Payer: Managed Care, Other (non HMO) | Admitting: Internal Medicine

## 2013-10-23 ENCOUNTER — Encounter: Payer: Self-pay | Admitting: Internal Medicine

## 2013-10-23 VITALS — BP 120/92 | HR 74 | Temp 98.4°F | Wt 220.8 lb

## 2013-10-23 DIAGNOSIS — E739 Lactose intolerance, unspecified: Secondary | ICD-10-CM

## 2013-10-23 DIAGNOSIS — Z0000 Encounter for general adult medical examination without abnormal findings: Secondary | ICD-10-CM

## 2013-10-23 DIAGNOSIS — Z124 Encounter for screening for malignant neoplasm of cervix: Secondary | ICD-10-CM

## 2013-10-23 DIAGNOSIS — E669 Obesity, unspecified: Secondary | ICD-10-CM

## 2013-10-23 LAB — CBC WITH DIFFERENTIAL/PLATELET
BASOS PCT: 0.4 % (ref 0.0–3.0)
Basophils Absolute: 0 10*3/uL (ref 0.0–0.1)
Eosinophils Absolute: 0.3 10*3/uL (ref 0.0–0.7)
Eosinophils Relative: 4 % (ref 0.0–5.0)
HCT: 40.3 % (ref 36.0–46.0)
Hemoglobin: 13.8 g/dL (ref 12.0–15.0)
Lymphocytes Relative: 27.8 % (ref 12.0–46.0)
Lymphs Abs: 2 10*3/uL (ref 0.7–4.0)
MCHC: 34.1 g/dL (ref 30.0–36.0)
MCV: 91 fl (ref 78.0–100.0)
Monocytes Absolute: 0.7 10*3/uL (ref 0.1–1.0)
Monocytes Relative: 9.4 % (ref 3.0–12.0)
NEUTROS PCT: 58.4 % (ref 43.0–77.0)
Neutro Abs: 4.1 10*3/uL (ref 1.4–7.7)
Platelets: 250 10*3/uL (ref 150.0–400.0)
RBC: 4.43 Mil/uL (ref 3.87–5.11)
RDW: 12.9 % (ref 11.5–15.5)
WBC: 7.1 10*3/uL (ref 4.0–10.5)

## 2013-10-23 LAB — BASIC METABOLIC PANEL
BUN: 15 mg/dL (ref 6–23)
CO2: 31 meq/L (ref 19–32)
Calcium: 9.8 mg/dL (ref 8.4–10.5)
Chloride: 100 mEq/L (ref 96–112)
Creatinine, Ser: 1.1 mg/dL (ref 0.4–1.2)
GFR: 57.81 mL/min — ABNORMAL LOW (ref >60.00)
Glucose, Bld: 125 mg/dL — ABNORMAL HIGH (ref 70–99)
Potassium: 4.1 mEq/L (ref 3.5–5.1)
Sodium: 138 mEq/L (ref 135–145)

## 2013-10-23 LAB — URINALYSIS, ROUTINE W REFLEX MICROSCOPIC
Bilirubin Urine: NEGATIVE
HGB URINE DIPSTICK: NEGATIVE
Ketones, ur: NEGATIVE
Leukocytes, UA: NEGATIVE
Nitrite: NEGATIVE
PH: 7.5 (ref 5.0–8.0)
Specific Gravity, Urine: 1.01 (ref 1.000–1.030)
Total Protein, Urine: NEGATIVE
Urine Glucose: NEGATIVE
Urobilinogen, UA: 0.2 (ref 0.0–1.0)

## 2013-10-23 LAB — HEPATIC FUNCTION PANEL
ALK PHOS: 34 U/L — AB (ref 39–117)
ALT: 38 U/L — ABNORMAL HIGH (ref 0–35)
AST: 25 U/L (ref 0–37)
Albumin: 4.3 g/dL (ref 3.5–5.2)
BILIRUBIN DIRECT: 0.1 mg/dL (ref 0.0–0.3)
BILIRUBIN TOTAL: 0.6 mg/dL (ref 0.2–1.2)
Total Protein: 6.9 g/dL (ref 6.0–8.3)

## 2013-10-23 LAB — LIPID PANEL
Cholesterol: 163 mg/dL (ref 0–200)
HDL: 33.8 mg/dL — ABNORMAL LOW (ref >39.00)
LDL CALC: 84 mg/dL (ref 0–99)
NONHDL: 129.2
Total CHOL/HDL Ratio: 5
Triglycerides: 225 mg/dL — ABNORMAL HIGH (ref 0.0–149.0)
VLDL: 45 mg/dL — AB (ref 0.0–40.0)

## 2013-10-23 LAB — TSH: TSH: 1.9 u[IU]/mL (ref 0.35–4.50)

## 2013-10-23 LAB — HEMOGLOBIN A1C: HEMOGLOBIN A1C: 6.2 % (ref 4.6–6.5)

## 2013-10-23 NOTE — Assessment & Plan Note (Signed)
Wt Readings from Last 3 Encounters:  10/23/13 220 lb 12.8 oz (100.154 kg)  06/04/13 215 lb (97.523 kg)  04/24/13 212 lb (96.163 kg)   Weight changes reviewed The patient is asked to make an attempt to improve diet and exercise patterns to aid in medical management of this problem. Consider eval for ?OSA in setting of HTN and fatigue/snoring, but pt declines eval for same at this time - will call if daytime fatigue or BP control worse

## 2013-10-23 NOTE — Assessment & Plan Note (Signed)
Hx gestational DM 2008, no meds for DM since that time Generally reports she is doing well with diet and exercise -  need for weight loss reviewed Check annual a1c Lab Results  Component Value Date   HGBA1C 6.0 11/01/2012

## 2013-10-23 NOTE — Progress Notes (Signed)
Pre visit review using our clinic review tool, if applicable. No additional management support is needed unless otherwise documented below in the visit note. 

## 2013-10-23 NOTE — Patient Instructions (Addendum)
It was good to see you today.  We have reviewed your prior records including labs and tests today  Health Maintenance reviewed - will help set up your follow up with Dr Ulanda Edison for PAP and pelvic - all other recommended immunizations and age-appropriate screenings are up-to-date.  Test(s) ordered today. Your results will be released to [facility] (or called to you) after review, usually within 72hours after test completion. If any changes need to be made, you will be notified at that same time.  Medications reviewed and updated, no changes recommended at this time.  Please schedule followup in 12 months for annual exam and labs, call sooner if problems.  Health Maintenance, Female A healthy lifestyle and preventative care can promote health and wellness.  Maintain regular health, dental, and eye exams.  Eat a healthy diet. Foods like vegetables, fruits, whole grains, low-fat dairy products, and lean protein foods contain the nutrients you need without too many calories. Decrease your intake of foods high in solid fats, added sugars, and salt. Get information about a proper diet from your caregiver, if necessary.  Regular physical exercise is one of the most important things you can do for your health. Most adults should get at least 150 minutes of moderate-intensity exercise (any activity that increases your heart rate and causes you to sweat) each week. In addition, most adults need muscle-strengthening exercises on 2 or more days a week.   Maintain a healthy weight. The body mass index (BMI) is a screening tool to identify possible weight problems. It provides an estimate of body fat based on height and weight. Your caregiver can help determine your BMI, and can help you achieve or maintain a healthy weight. For adults 20 years and older:  A BMI below 18.5 is considered underweight.  A BMI of 18.5 to 24.9 is normal.  A BMI of 25 to 29.9 is considered overweight.  A BMI of 30 and above is  considered obese.  Maintain normal blood lipids and cholesterol by exercising and minimizing your intake of saturated fat. Eat a balanced diet with plenty of fruits and vegetables. Blood tests for lipids and cholesterol should begin at age 61 and be repeated every 5 years. If your lipid or cholesterol levels are high, you are over 50, or you are a high risk for heart disease, you may need your cholesterol levels checked more frequently.Ongoing high lipid and cholesterol levels should be treated with medicines if diet and exercise are not effective.  If you smoke, find out from your caregiver how to quit. If you do not use tobacco, do not start.  Lung cancer screening is recommended for adults aged 83 80 years who are at high risk for developing lung cancer because of a history of smoking. Yearly low-dose computed tomography (CT) is recommended for people who have at least a 30-pack-year history of smoking and are a current smoker or have quit within the past 15 years. A pack year of smoking is smoking an average of 1 pack of cigarettes a day for 1 year (for example: 1 pack a day for 30 years or 2 packs a day for 15 years). Yearly screening should continue until the smoker has stopped smoking for at least 15 years. Yearly screening should also be stopped for people who develop a health problem that would prevent them from having lung cancer treatment.  If you are pregnant, do not drink alcohol. If you are breastfeeding, be very cautious about drinking alcohol. If you are  not pregnant and choose to drink alcohol, do not exceed 1 drink per day. One drink is considered to be 12 ounces (355 mL) of beer, 5 ounces (148 mL) of wine, or 1.5 ounces (44 mL) of liquor.  Avoid use of street drugs. Do not share needles with anyone. Ask for help if you need support or instructions about stopping the use of drugs.  High blood pressure causes heart disease and increases the risk of stroke. Blood pressure should be  checked at least every 1 to 2 years. Ongoing high blood pressure should be treated with medicines, if weight loss and exercise are not effective.  If you are 74 to 47 years old, ask your caregiver if you should take aspirin to prevent strokes.  Diabetes screening involves taking a blood sample to check your fasting blood sugar level. This should be done once every 3 years, after age 75, if you are within normal weight and without risk factors for diabetes. Testing should be considered at a younger age or be carried out more frequently if you are overweight and have at least 1 risk factor for diabetes.  Breast cancer screening is essential preventative care for women. You should practice "breast self-awareness." This means understanding the normal appearance and feel of your breasts and may include breast self-examination. Any changes detected, no matter how small, should be reported to a caregiver. Women in their 4s and 30s should have a clinical breast exam (CBE) by a caregiver as part of a regular health exam every 1 to 3 years. After age 30, women should have a CBE every year. Starting at age 63, women should consider having a mammogram (breast X-ray) every year. Women who have a family history of breast cancer should talk to their caregiver about genetic screening. Women at a high risk of breast cancer should talk to their caregiver about having an MRI and a mammogram every year.  Breast cancer gene (BRCA)-related cancer risk assessment is recommended for women who have family members with BRCA-related cancers. BRCA-related cancers include breast, ovarian, tubal, and peritoneal cancers. Having family members with these cancers may be associated with an increased risk for harmful changes (mutations) in the breast cancer genes BRCA1 and BRCA2. Results of the assessment will determine the need for genetic counseling and BRCA1 and BRCA2 testing.  The Pap test is a screening test for cervical cancer. Women  should have a Pap test starting at age 17. Between ages 16 and 28, Pap tests should be repeated every 2 years. Beginning at age 81, you should have a Pap test every 3 years as long as the past 3 Pap tests have been normal. If you had a hysterectomy for a problem that was not cancer or a condition that could lead to cancer, then you no longer need Pap tests. If you are between ages 32 and 54, and you have had normal Pap tests going back 10 years, you no longer need Pap tests. If you have had past treatment for cervical cancer or a condition that could lead to cancer, you need Pap tests and screening for cancer for at least 20 years after your treatment. If Pap tests have been discontinued, risk factors (such as a new sexual partner) need to be reassessed to determine if screening should be resumed. Some women have medical problems that increase the chance of getting cervical cancer. In these cases, your caregiver may recommend more frequent screening and Pap tests.  The human papillomavirus (HPV) test is  an additional test that may be used for cervical cancer screening. The HPV test looks for the virus that can cause the cell changes on the cervix. The cells collected during the Pap test can be tested for HPV. The HPV test could be used to screen women aged 58 years and older, and should be used in women of any age who have unclear Pap test results. After the age of 46, women should have HPV testing at the same frequency as a Pap test.  Colorectal cancer can be detected and often prevented. Most routine colorectal cancer screening begins at the age of 81 and continues through age 36. However, your caregiver may recommend screening at an earlier age if you have risk factors for colon cancer. On a yearly basis, your caregiver may provide home test kits to check for hidden blood in the stool. Use of a small camera at the end of a tube, to directly examine the colon (sigmoidoscopy or colonoscopy), can detect the  earliest forms of colorectal cancer. Talk to your caregiver about this at age 47, when routine screening begins. Direct examination of the colon should be repeated every 5 to 10 years through age 41, unless early forms of pre-cancerous polyps or small growths are found.  Hepatitis C blood testing is recommended for all people born from [birth year] through 1964 and any individual with known risks for hepatitis C.  Practice safe sex. Use condoms and avoid high-risk sexual practices to reduce the spread of sexually transmitted infections (STIs). Sexually active women aged 30 and younger should be checked for Chlamydia, which is a common sexually transmitted infection. Older women with new or multiple partners should also be tested for Chlamydia. Testing for other STIs is recommended if you are sexually active and at increased risk.  Osteoporosis is a disease in which the bones lose minerals and strength with aging. This can result in serious bone fractures. The risk of osteoporosis can be identified using a bone density scan. Women ages 69 and over and women at risk for fractures or osteoporosis should discuss screening with their caregivers. Ask your caregiver whether you should be taking a calcium supplement or vitamin D to reduce the rate of osteoporosis.  Menopause can be associated with physical symptoms and risks. Hormone replacement therapy is available to decrease symptoms and risks. You should talk to your caregiver about whether hormone replacement therapy is right for you.  Use sunscreen. Apply sunscreen liberally and repeatedly throughout the day. You should seek shade when your shadow is shorter than you. Protect yourself by wearing long sleeves, pants, a wide-brimmed hat, and sunglasses year round, whenever you are outdoors.  Notify your caregiver of new moles or changes in moles, especially if there is a change in shape or color. Also notify your caregiver if a mole is larger than the size of a  pencil eraser.  Stay current with your immunizations. Document Released: 11/08/2010 Document Revised: 08/20/2012 Document Reviewed: 11/08/2010 [facility] Patient Information 2014 [employer].

## 2013-10-23 NOTE — Progress Notes (Signed)
Subjective:    Patient ID: [name], female    DOB: 21-Nov-1965, 48 y.o.   MRN: [id number]  HPI  patient is here today for annual physical. Patient feels well overall.  Also reviewed chronic medical issues and interval medical events  Past Medical History  Diagnosis Date  . GLUCOSE INTOLERANCE   . Hypercalcemia   . OBESITY   . COMMON MIGRAINE   . INSOMNIA   . HYPERLIPIDEMIA   . HYPERTENSION   . RENAL INSUFFICIENCY   . ALLERGIC RHINITIS    Family History  Problem Relation Age of Onset  . Diabetes Maternal Grandfather    History  Substance Use Topics  . Smoking status: Former Smoker    Quit date: 05/09/2005  . Smokeless tobacco: Not on file     Comment: Married, lives with spouse and son. 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 Alcohol Use: Yes     Comment: social   Review of Systems  Constitutional: Positive for fatigue. Negative for unexpected weight change.  Respiratory: Negative for cough, shortness of breath and wheezing.   Cardiovascular: Negative for chest pain, palpitations and leg swelling.  Gastrointestinal: Negative for nausea, abdominal pain and diarrhea.  Neurological: Negative for dizziness, weakness, light-headedness and headaches.  Psychiatric/Behavioral: Negative for dysphoric mood. The patient is not nervous/anxious.   All other systems reviewed and are negative.      Objective:   Physical Exam  BP 120/92  Pulse 74  Temp(Src) 98.4 F (36.9 C) (Oral)  Wt 220 lb 12.8 oz (100.154 kg)  SpO2 96% Wt Readings from Last 3 Encounters:  10/23/13 220 lb 12.8 oz (100.154 kg)  06/04/13 215 lb (97.523 kg)  04/24/13 212 lb (96.163 kg)   Constitutional: She is obese, but appears well-developed and well-nourished. No distress. son at side HENT: Head: Normocephalic and atraumatic. Ears: B TMs ok, no erythema or effusion; Nose: Nose normal. Mouth/Throat: Oropharynx is clear and moist. No oropharyngeal exudate.  Eyes:  Conjunctivae and EOM are normal. Pupils are equal, round, and reactive to light. No scleral icterus.  Neck: Normal range of motion. Neck supple. No JVD present. No thyromegaly present.  Cardiovascular: Normal rate, regular rhythm and normal heart sounds.  No murmur heard. No BLE edema. Pulmonary/Chest: Effort normal and breath sounds normal. No respiratory distress. She has no wheezes.  Abdominal: Soft. Bowel sounds are normal. She exhibits no distension. There is no tenderness. no masses Musculoskeletal: Normal range of motion, no joint effusions. No gross deformities Neurological: She is alert and oriented to person, place, and time. No cranial nerve deficit. Coordination, balance, strength, speech and gait are normal.  Skin: Skin is warm and dry. No rash noted. No erythema.  Psychiatric: She has a normal mood and affect. Her behavior is normal. Judgment and thought content normal.    Lab Results  Component Value Date   WBC 7.6 10/31/2012   HGB 14.5 10/31/2012   HCT 42.0 10/31/2012   PLT 249.0 10/31/2012   GLUCOSE 116* 10/31/2012   CHOL 195 10/31/2012   TRIG 292.0* 10/31/2012   HDL 33.60* 10/31/2012   LDLDIRECT 124.5 10/31/2012   LDLCALC 69 02/16/2012   ALT 40* 10/31/2012   AST 24 10/31/2012   NA 137 10/31/2012   K 4.1 10/31/2012   CL 101 10/31/2012   CREATININE 1.1 10/31/2012   BUN 15 10/31/2012   CO2 29 10/31/2012   TSH 1.45 10/31/2012   HGBA1C 6.0 11/01/2012    Mm  Digital Screening W/ Implants  04/23/2013   CLINICAL DATA:  Screening.  EXAM: DIGITAL SCREENING BILATERAL MAMMOGRAM WITH IMPLANTS AND CAD  The patient has retroglandular saline implants. Standard and implant displaced views were performed. The right implant is collapsed.  COMPARISON:  None  ACR Breast Density Category c: The breasts are heterogeneously dense, which may obscure small masses  FINDINGS: There are no findings suspicious for malignancy. Images were processed with CAD.  IMPRESSION: No mammographic evidence of malignancy. A  result letter of this screening mammogram will be mailed directly to the patient.  RECOMMENDATION: Screening mammogram in one year. (Code:SM-B-01Y)  BI-RADS CATEGORY  1:  Negative   Electronically Signed   By: Ulyess Blossom M.D.   On: 04/23/2013 14:53       Assessment & Plan:   CPX/v70.0 - Patient has been counseled on age-appropriate routine health concerns for screening and prevention. These are reviewed and up-to-date. Immunizations are up-to-date or declined. Labs ordered and reviewed.  Problem List Items Addressed This Visit   GLUCOSE INTOLERANCE      Hx gestational DM 2008, no meds for DM since that time Generally reports she is doing well with diet and exercise -  need for weight loss reviewed Check annual a1c Lab Results  Component Value Date   HGBA1C 6.0 11/01/2012      Relevant Orders      Hemoglobin A1c   OBESITY      Wt Readings from Last 3 Encounters:  10/23/13 220 lb 12.8 oz (100.154 kg)  06/04/13 215 lb (97.523 kg)  04/24/13 212 lb (96.163 kg)   Weight changes reviewed The patient is asked to make an attempt to improve diet and exercise patterns to aid in medical management of this problem. Consider eval for ?OSA in setting of HTN and fatigue/snoring, but pt declines eval for same at this time - will call if daytime fatigue or BP control worse     Other Visit Diagnoses   Routine general medical examination at a health care facility    -  Primary    Relevant Orders       Basic metabolic panel       CBC with Differential       Hepatic function panel       Lipid panel       TSH       Urinalysis, Routine w reflex microscopic    Screening for cervical cancer        Relevant Orders       Ambulatory referral to Gynecology

## 2014-01-04 ENCOUNTER — Other Ambulatory Visit: Payer: Self-pay | Admitting: Internal Medicine

## 2014-01-19 ENCOUNTER — Other Ambulatory Visit: Payer: Self-pay | Admitting: Internal Medicine

## 2014-01-23 ENCOUNTER — Other Ambulatory Visit: Payer: Self-pay | Admitting: Internal Medicine

## 2014-04-28 ENCOUNTER — Other Ambulatory Visit: Payer: Self-pay | Admitting: Internal Medicine

## 2014-05-12 ENCOUNTER — Telehealth: Payer: Self-pay | Admitting: Internal Medicine

## 2014-05-12 NOTE — Telephone Encounter (Signed)
error 

## 2014-06-12 ENCOUNTER — Ambulatory Visit (INDEPENDENT_AMBULATORY_CARE_PROVIDER_SITE_OTHER): Payer: 59 | Admitting: Internal Medicine

## 2014-06-12 ENCOUNTER — Encounter: Payer: Self-pay | Admitting: Internal Medicine

## 2014-06-12 VITALS — BP 118/80 | HR 76 | Temp 97.8°F | Resp 16 | Ht 64.0 in | Wt 223.0 lb

## 2014-06-12 DIAGNOSIS — J069 Acute upper respiratory infection, unspecified: Secondary | ICD-10-CM

## 2014-06-12 DIAGNOSIS — J209 Acute bronchitis, unspecified: Secondary | ICD-10-CM

## 2014-06-12 MED ORDER — HYDROCODONE-HOMATROPINE 5-1.5 MG/5ML PO SYRP: 5.0000 mL | ORAL_SOLUTION | Freq: Four times a day (QID) | ORAL | Status: DC | PRN

## 2014-06-12 MED ORDER — AMOXICILLIN 500 MG PO CAPS: 500.0000 mg | ORAL_CAPSULE | Freq: Three times a day (TID) | ORAL | Status: DC

## 2014-06-12 NOTE — Progress Notes (Signed)
   Subjective:    Patient ID: [name], female    DOB: 1965-07-29, 49 y.o.   MRN: [id number]  HPI Symptoms began 2.5 weeks ago as a dry cough and chest tightness. The cough has progressed and is now productive of green material. The paroxysms of cough are associated with chest discomfort and frontal headache. Cough is worse when supine.  She describes postnasal drainage, frontal sinus pressure, facial discomfort, and some wheezing.  She is a former smoker up to one half pack per day for 15 years. She has no history of asthma  Review of Systems  She denies extrinsic symptoms of itchy, watery eyes, sneezing.  She's had no fever, chills, or sweats.  She also denies any nasal purulence.  The cough is not associated with shortness of breath.     Objective:   Physical Exam  Pertinent positive findings include:  As per CDC Guidelines ,Epic documents obesity as being present . She has multiple earrings with 1 in the cartilage of the upper left ear. Nares are dry and slightly inflamed. She exhibits an intermittent nonproductive cough.  General appearance:Adequately nourished; no acute distress or increased work of breathing is present.  No  lymphadenopathy about the head, neck, or axilla noted.  Eyes: No conjunctival inflammation or lid edema is present. There is no scleral icterus. Ears:  External ear exam shows no significant lesions or deformities.  Otoscopic examination reveals clear canals, tympanic membranes are intact bilaterally without bulging, retraction, inflammation or discharge. Nose:  External nasal examination shows no deformity or inflammation. No septal dislocation or deviation.No obstruction to airflow.  Oral exam: Dental hygiene is good; lips and gums are healthy appearing.There is no oropharyngeal erythema or exudate noted.  Neck:  No deformities, thyromegaly, masses, or tenderness noted.   Supple with full range of motion without pain.  Heart:  Normal rate and  regular rhythm. S1 and S2 normal without gallop, murmur, click, rub or other extra sounds.  Lungs:Chest clear to auscultation; no wheezes, rhonchi,rales ,or rubs present. Extremities:  No cyanosis, edema, or clubbing  noted  Skin: Warm & dry w/o jaundice or tenting.       Assessment & Plan:  #1 acute bronchitis w/o bronchospasm #2 URI, acute Plan: See orders and recommendations

## 2014-06-12 NOTE — Progress Notes (Signed)
Pre visit review using our clinic review tool, if applicable. No additional management support is needed unless otherwise documented below in the visit note. 

## 2014-06-12 NOTE — Patient Instructions (Signed)

## 2014-10-30 ENCOUNTER — Encounter: Payer: Self-pay | Admitting: Internal Medicine

## 2014-10-30 ENCOUNTER — Ambulatory Visit (INDEPENDENT_AMBULATORY_CARE_PROVIDER_SITE_OTHER): Payer: 59 | Admitting: Internal Medicine

## 2014-10-30 ENCOUNTER — Encounter: Payer: Managed Care, Other (non HMO) | Admitting: Internal Medicine

## 2014-10-30 ENCOUNTER — Other Ambulatory Visit (INDEPENDENT_AMBULATORY_CARE_PROVIDER_SITE_OTHER): Payer: 59

## 2014-10-30 VITALS — BP 110/70 | HR 81 | Temp 98.4°F | Resp 18 | Ht 63.0 in | Wt 223.0 lb

## 2014-10-30 DIAGNOSIS — R7989 Other specified abnormal findings of blood chemistry: Secondary | ICD-10-CM | POA: Diagnosis not present

## 2014-10-30 DIAGNOSIS — I10 Essential (primary) hypertension: Secondary | ICD-10-CM

## 2014-10-30 DIAGNOSIS — Z0000 Encounter for general adult medical examination without abnormal findings: Secondary | ICD-10-CM

## 2014-10-30 DIAGNOSIS — E739 Lactose intolerance, unspecified: Secondary | ICD-10-CM

## 2014-10-30 DIAGNOSIS — E785 Hyperlipidemia, unspecified: Secondary | ICD-10-CM

## 2014-10-30 DIAGNOSIS — E669 Obesity, unspecified: Secondary | ICD-10-CM

## 2014-10-30 LAB — LDL CHOLESTEROL, DIRECT: LDL DIRECT: 92 mg/dL

## 2014-10-30 LAB — CBC
HEMATOCRIT: 42.7 % (ref 36.0–46.0)
HEMOGLOBIN: 14.5 g/dL (ref 12.0–15.0)
MCHC: 34 g/dL (ref 30.0–36.0)
MCV: 90.4 fl (ref 78.0–100.0)
Platelets: 251 10*3/uL (ref 150.0–400.0)
RBC: 4.72 Mil/uL (ref 3.87–5.11)
RDW: 12.8 % (ref 11.5–15.5)
WBC: 7.7 10*3/uL (ref 4.0–10.5)

## 2014-10-30 LAB — COMPREHENSIVE METABOLIC PANEL
ALK PHOS: 38 U/L — AB (ref 39–117)
ALT: 40 U/L — ABNORMAL HIGH (ref 0–35)
AST: 27 U/L (ref 0–37)
Albumin: 4.2 g/dL (ref 3.5–5.2)
BUN: 15 mg/dL (ref 6–23)
CO2: 29 mEq/L (ref 19–32)
CREATININE: 1.12 mg/dL (ref 0.40–1.20)
Calcium: 10.1 mg/dL (ref 8.4–10.5)
Chloride: 102 mEq/L (ref 96–112)
GFR: 55.2 mL/min — ABNORMAL LOW (ref >60.00)
GLUCOSE: 111 mg/dL — AB (ref 70–99)
Potassium: 3.7 mEq/L (ref 3.5–5.1)
SODIUM: 139 meq/L (ref 135–145)
Total Bilirubin: 0.4 mg/dL (ref 0.2–1.2)
Total Protein: 7 g/dL (ref 6.0–8.3)

## 2014-10-30 LAB — TSH: TSH: 1.87 u[IU]/mL (ref 0.35–4.50)

## 2014-10-30 LAB — LIPID PANEL
CHOL/HDL RATIO: 4
Cholesterol: 145 mg/dL (ref 0–200)
HDL: 32.6 mg/dL — AB (ref >39.00)
NonHDL: 112.4
TRIGLYCERIDES: 206 mg/dL — AB (ref 0.0–149.0)
VLDL: 41.2 mg/dL — ABNORMAL HIGH (ref 0.0–40.0)

## 2014-10-30 LAB — HEMOGLOBIN A1C: HEMOGLOBIN A1C: 6.2 % (ref 4.6–6.5)

## 2014-10-30 MED ORDER — FENOFIBRATE 145 MG PO TABS: ORAL_TABLET | ORAL | Status: DC

## 2014-10-30 MED ORDER — LABETALOL HCL 200 MG PO TABS: ORAL_TABLET | ORAL | Status: DC

## 2014-10-30 MED ORDER — ATORVASTATIN CALCIUM 40 MG PO TABS: ORAL_TABLET | ORAL | Status: DC

## 2014-10-30 MED ORDER — TRIAMTERENE-HCTZ 37.5-25 MG PO CAPS: 1.0000 | ORAL_CAPSULE | Freq: Every day | ORAL | Status: DC

## 2014-10-30 MED ORDER — ZOLPIDEM TARTRATE 5 MG PO TABS: 5.0000 mg | ORAL_TABLET | Freq: Every evening | ORAL | Status: DC | PRN

## 2014-10-30 NOTE — Assessment & Plan Note (Signed)
Checking BMP today, BP at goal. Continue labetalol and hctz and adjust as needed based on results.

## 2014-10-30 NOTE — Assessment & Plan Note (Signed)
Recheck HgA1c which has been steadily increasing over the years. Talked to her about diet and exercise with weight loss as a way to prevent progression to diabetes.

## 2014-10-30 NOTE — Assessment & Plan Note (Signed)
She has not returned to her Ob/Gyn for cervical cancer screening and talked to her about that. Colonoscopy due at 72. Immunizations up to date. Declined HIV screening. Non-smoker for almost 10 years now. Needs exercise and counseled her on dietary changes.

## 2014-10-30 NOTE — Progress Notes (Signed)
   Subjective:    Patient ID: [name], female    DOB: 1965/09/25, 49 y.o.   MRN: [id number]  HPI The patient is a 49 YO female coming in for wellness. She lost her job this year and it has been very good for her stress levels. No new complaints. Please see A/P for status and treatment of her chronic medical problems.   PMH, [facility], social history reviewed and updated.   Review of Systems  Constitutional: Negative for fever, activity change, appetite change and fatigue.  HENT: Negative.   Eyes: Negative.   Respiratory: Negative for cough, chest tightness, shortness of breath and wheezing.   Cardiovascular: Negative for chest pain, palpitations and leg swelling.  Gastrointestinal: Negative for nausea, abdominal pain, diarrhea, constipation and abdominal distention.  Musculoskeletal: Negative.   Skin: Negative.   Neurological: Negative.   Psychiatric/Behavioral: Negative.       Objective:   Physical Exam  Constitutional: She is oriented to person, place, and time. She appears well-developed and well-nourished.  HENT:  Head: Normocephalic and atraumatic.  Eyes: EOM are normal.  Neck: Normal range of motion.  Cardiovascular: Normal rate and regular rhythm.   Pulmonary/Chest: Effort normal and breath sounds normal. No respiratory distress. She has no wheezes. She has no rales.  Abdominal: Soft. She exhibits no distension. There is no tenderness. There is no rebound.  Musculoskeletal: She exhibits no edema.  Neurological: She is alert and oriented to person, place, and time. Coordination normal.  Skin: Skin is warm and dry.  Psychiatric: She has a normal mood and affect.   Filed Vitals:   10/30/14 0806  BP: 110/70  Pulse: 81  Temp: 98.4 F (36.9 C)  TempSrc: Oral  Resp: 18  Height: 5\' 3"  (1.6 m)  Weight: 223 lb (101.152 kg)  SpO2: 96%      Assessment & Plan:

## 2014-10-30 NOTE — Assessment & Plan Note (Signed)
Checking lipid panel, continue lipitor and fenofibrate and adjust as needed.

## 2014-10-30 NOTE — Patient Instructions (Signed)
We will check the blood work today and call you back with the results.   The only thing that you are due for is a pap smear. Think about getting that done in the next year.   Work on getting back into exercise to keep your heart and body healthy for a long time.   Health Maintenance Adopting a healthy lifestyle and getting preventive care can go a long way to promote health and wellness. Talk with your health care provider about what schedule of regular examinations is right for you. This is a good chance for you to check in with your provider about disease prevention and staying healthy. In between checkups, there are plenty of things you can do on your own. Experts have done a lot of research about which lifestyle changes and preventive measures are most likely to keep you healthy. Ask your health care provider for more information. WEIGHT AND DIET  Eat a healthy diet  Be sure to include plenty of vegetables, fruits, low-fat dairy products, and lean protein.  Do not eat a lot of foods high in solid fats, added sugars, or salt.  Get regular exercise. This is one of the most important things you can do for your health.  Most adults should exercise for at least 150 minutes each week. The exercise should increase your heart rate and make you sweat (moderate-intensity exercise).  Most adults should also do strengthening exercises at least twice a week. This is in addition to the moderate-intensity exercise.  Maintain a healthy weight  Body mass index (BMI) is a measurement that can be used to identify possible weight problems. It estimates body fat based on height and weight. Your health care provider can help determine your BMI and help you achieve or maintain a healthy weight.  For females 33 years of age and older:   A BMI below 18.5 is considered underweight.  A BMI of 18.5 to 24.9 is normal.  A BMI of 25 to 29.9 is considered overweight.  A BMI of 30 and above is considered  obese.  Watch levels of cholesterol and blood lipids  You should start having your blood tested for lipids and cholesterol at 48 years of age, then have this test every 5 years.  You may need to have your cholesterol levels checked more often if:  Your lipid or cholesterol levels are high.  You are older than 48 years of age.  You are at high risk for heart disease.  CANCER SCREENING   Lung Cancer  Lung cancer screening is recommended for adults 4-45 years old who are at high risk for lung cancer because of a history of smoking.  A yearly low-dose CT scan of the lungs is recommended for people who:  Currently smoke.  Have quit within the past 15 years.  Have at least a 30-pack-year history of smoking. A pack year is smoking an average of one pack of cigarettes a day for 1 year.  Yearly screening should continue until it has been 15 years since you quit.  Yearly screening should stop if you develop a health problem that would prevent you from having lung cancer treatment.  Breast Cancer  Practice breast self-awareness. This means understanding how your breasts normally appear and feel.  It also means doing regular breast self-exams. Let your health care provider know about any changes, no matter how small.  If you are in your 20s or 30s, you should have a clinical breast exam (CBE) by  a health care provider every 1-3 years as part of a regular health exam.  If you are 40 or older, have a CBE every year. Also consider having a breast X-ray (mammogram) every year.  If you have a family history of breast cancer, talk to your health care provider about genetic screening.  If you are at high risk for breast cancer, talk to your health care provider about having an MRI and a mammogram every year.  Breast cancer gene (BRCA) assessment is recommended for women who have family members with BRCA-related cancers. BRCA-related cancers  include:  Breast.  Ovarian.  Tubal.  Peritoneal cancers.  Results of the assessment will determine the need for genetic counseling and BRCA1 and BRCA2 testing. Cervical Cancer Routine pelvic examinations to screen for cervical cancer are no longer recommended for nonpregnant women who are considered low risk for cancer of the pelvic organs (ovaries, uterus, and vagina) and who do not have symptoms. A pelvic examination may be necessary if you have symptoms including those associated with pelvic infections. Ask your health care provider if a screening pelvic exam is right for you.   The Pap test is the screening test for cervical cancer for women who are considered at risk.  If you had a hysterectomy for a problem that was not cancer or a condition that could lead to cancer, then you no longer need Pap tests.  If you are older than 65 years, and you have had normal Pap tests for the past 10 years, you no longer need to have Pap tests.  If you have had past treatment for cervical cancer or a condition that could lead to cancer, you need Pap tests and screening for cancer for at least 20 years after your treatment.  If you no longer get a Pap test, assess your risk factors if they change (such as having a new sexual partner). This can affect whether you should start being screened again.  Some women have medical problems that increase their chance of getting cervical cancer. If this is the case for you, your health care provider may recommend more frequent screening and Pap tests.  The human papillomavirus (HPV) test is another test that may be used for cervical cancer screening. The HPV test looks for the virus that can cause cell changes in the cervix. The cells collected during the Pap test can be tested for HPV.  The HPV test can be used to screen women 30 years of age and older. Getting tested for HPV can extend the interval between normal Pap tests from three to five years.  An HPV  test also should be used to screen women of any age who have unclear Pap test results.  After 48 years of age, women should have HPV testing as often as Pap tests.  Colorectal Cancer  This type of cancer can be detected and often prevented.  Routine colorectal cancer screening usually begins at 48 years of age and continues through 48 years of age.  Your health care provider may recommend screening at an earlier age if you have risk factors for colon cancer.  Your health care provider may also recommend using home test kits to check for hidden blood in the stool.  A small camera at the end of a tube can be used to examine your colon directly (sigmoidoscopy or colonoscopy). This is done to check for the earliest forms of colorectal cancer.  Routine screening usually begins at age 50.  Direct examination   of the colon should be repeated every 5-10 years through 48 years of age. However, you may need to be screened more often if early forms of precancerous polyps or small growths are found. Skin Cancer  Check your skin from head to toe regularly.  Tell your health care provider about any new moles or changes in moles, especially if there is a change in a mole's shape or color.  Also tell your health care provider if you have a mole that is larger than the size of a pencil eraser.  Always use sunscreen. Apply sunscreen liberally and repeatedly throughout the day.  Protect yourself by wearing long sleeves, pants, a wide-brimmed hat, and sunglasses whenever you are outside. HEART DISEASE, DIABETES, AND HIGH BLOOD PRESSURE   Have your blood pressure checked at least every 1-2 years. High blood pressure causes heart disease and increases the risk of stroke.  If you are between 55 years and 79 years old, ask your health care provider if you should take aspirin to prevent strokes.  Have regular diabetes screenings. This involves taking a blood sample to check your fasting blood sugar  level.  If you are at a normal weight and have a low risk for diabetes, have this test once every three years after 48 years of age.  If you are overweight and have a high risk for diabetes, consider being tested at a younger age or more often. PREVENTING INFECTION  Hepatitis B  If you have a higher risk for hepatitis B, you should be screened for this virus. You are considered at high risk for hepatitis B if:  You were born in a country where hepatitis B is common. Ask your health care provider which countries are considered high risk.  Your parents were born in a high-risk country, and you have not been immunized against hepatitis B (hepatitis B vaccine).  You have HIV or AIDS.  You use needles to inject street drugs.  You live with someone who has hepatitis B.  You have had sex with someone who has hepatitis B.  You get hemodialysis treatment.  You take certain medicines for conditions, including cancer, organ transplantation, and autoimmune conditions. Hepatitis C  Blood testing is recommended for:  Everyone born from 1945 through 1965.  Anyone with known risk factors for hepatitis C. Sexually transmitted infections (STIs)  You should be screened for sexually transmitted infections (STIs) including gonorrhea and chlamydia if:  You are sexually active and are younger than 48 years of age.  You are older than 48 years of age and your health care provider tells you that you are at risk for this type of infection.  Your sexual activity has changed since you were last screened and you are at an increased risk for chlamydia or gonorrhea. Ask your health care provider if you are at risk.  If you do not have HIV, but are at risk, it may be recommended that you take a prescription medicine daily to prevent HIV infection. This is called pre-exposure prophylaxis (PrEP). You are considered at risk if:  You are sexually active and do not regularly use condoms or know the HIV status  of your partner(s).  You take drugs by injection.  You are sexually active with a partner who has HIV. Talk with your health care provider about whether you are at high risk of being infected with HIV. If you choose to begin PrEP, you should first be tested for HIV. You should then be tested every   3 months for as long as you are taking PrEP.  PREGNANCY   If you are premenopausal and you may become pregnant, ask your health care provider about preconception counseling.  If you may become pregnant, take 400 to 800 micrograms (mcg) of folic acid every day.  If you want to prevent pregnancy, talk to your health care provider about birth control (contraception). OSTEOPOROSIS AND MENOPAUSE   Osteoporosis is a disease in which the bones lose minerals and strength with aging. This can result in serious bone fractures. Your risk for osteoporosis can be identified using a bone density scan.  If you are 65 years of age or older, or if you are at risk for osteoporosis and fractures, ask your health care provider if you should be screened.  Ask your health care provider whether you should take a calcium or vitamin D supplement to lower your risk for osteoporosis.  Menopause may have certain physical symptoms and risks.  Hormone replacement therapy may reduce some of these symptoms and risks. Talk to your health care provider about whether hormone replacement therapy is right for you.  HOME CARE INSTRUCTIONS   Schedule regular health, dental, and eye exams.  Stay current with your immunizations.   Do not use any tobacco products including cigarettes, chewing tobacco, or electronic cigarettes.  If you are pregnant, do not drink alcohol.  If you are breastfeeding, limit how much and how often you drink alcohol.  Limit alcohol intake to no more than 1 drink per day for nonpregnant women. One drink equals 12 ounces of beer, 5 ounces of wine, or 1 ounces of hard liquor.  Do not use street  drugs.  Do not share needles.  Ask your health care provider for help if you need support or information about quitting drugs.  Tell your health care provider if you often feel depressed.  Tell your health care provider if you have ever been abused or do not feel safe at home. Document Released: 11/08/2010 Document Revised: 09/09/2013 Document Reviewed: 03/27/2013 Exit[employer] Patient Information 2015 [employer]. This information is not intended to replace advice given to you by your health care provider. Make sure you discuss any questions you have with your health care provider.  

## 2014-10-30 NOTE — Assessment & Plan Note (Signed)
Bordering on morbid obesity and talked to her about the complications of weight she already has including hypertension, hyperlipidemia and impaired glucose levels. She was working on diet and exercise before her mother-in-law got sick and passed away. Will get back within the next week and goal loss 50 pounds. Advised a smaller short term goal and she will work on it.

## 2014-10-30 NOTE — Progress Notes (Signed)
Pre visit review using our clinic review tool, if applicable. No additional management support is needed unless otherwise documented below in the visit note. 

## 2014-11-04 ENCOUNTER — Telehealth: Payer: Self-pay | Admitting: Internal Medicine

## 2014-11-04 NOTE — Telephone Encounter (Signed)
Patient is returning your call.  

## 2015-02-01 ENCOUNTER — Other Ambulatory Visit: Payer: Self-pay | Admitting: Internal Medicine

## 2015-03-12 ENCOUNTER — Other Ambulatory Visit: Payer: Self-pay | Admitting: Internal Medicine

## 2015-04-08 ENCOUNTER — Other Ambulatory Visit: Payer: Self-pay | Admitting: Internal Medicine

## 2015-07-01 ENCOUNTER — Telehealth: Payer: Self-pay | Admitting: Nurse Practitioner

## 2015-07-01 ENCOUNTER — Encounter: Payer: Self-pay | Admitting: Nurse Practitioner

## 2015-07-01 ENCOUNTER — Ambulatory Visit (INDEPENDENT_AMBULATORY_CARE_PROVIDER_SITE_OTHER): Payer: 59 | Admitting: Nurse Practitioner

## 2015-07-01 VITALS — BP 104/76 | HR 87 | Temp 98.9°F | Ht 64.0 in | Wt 218.0 lb

## 2015-07-01 DIAGNOSIS — R6889 Other general symptoms and signs: Secondary | ICD-10-CM | POA: Diagnosis not present

## 2015-07-01 LAB — POCT INFLUENZA A/B
Influenza A, POC: POSITIVE — AB
Influenza B, POC: POSITIVE — AB

## 2015-07-01 MED ORDER — OSELTAMIVIR PHOSPHATE 75 MG PO CAPS: 75.0000 mg | ORAL_CAPSULE | Freq: Two times a day (BID) | ORAL | Status: DC

## 2015-07-01 NOTE — Telephone Encounter (Signed)
Patient was under [facility] care, but wishes to xfer to [place]. She has an appt to establish with dr crawford.  Dr Sharlet Salina,  Is it ok with you if we go ahead and transition her to [place]?

## 2015-07-01 NOTE — Progress Notes (Signed)
Patient ID: [name], female    DOB: 06/09/1965  Age: 50 y.o. MRN: [id number]  CC: Influenza   HPI [name] presents for Flu-like symptoms x 4 days.   1) Cough, fever, Myalgias HA  Subjective fever  Coughing- phlegm   Treatment to date:  Cough syrup   Sick contacts- church members   History [name] has a past medical history of GLUCOSE INTOLERANCE; Hypercalcemia; OBESITY; COMMON MIGRAINE; INSOMNIA; HYPERLIPIDEMIA; HYPERTENSION; RENAL INSUFFICIENCY; and ALLERGIC RHINITIS.   She has past surgical history that includes Breast surgery (1998) and hypertension post partum (09/2006).   Her family history includes Diabetes in her maternal grandfather.She reports that she quit smoking about 10 years ago. She does not have any smokeless tobacco history on file. She reports that she drinks alcohol. She reports that she does not use illicit drugs.  Outpatient Prescriptions Prior to Visit  Medication Sig Dispense Refill  . Ascorbic Acid (VITAMIN C) 1000 MG tablet Take 1,000 mg by mouth daily.      Marland Kitchen atorvastatin (LIPITOR) 40 MG tablet TAKE 1 TABLET (40 MG TOTAL) BY MOUTH DAILY. 90 tablet 3  . citalopram (CELEXA) 10 MG tablet Take 1 tablet (10 mg total) by mouth daily. 90 tablet 2  . fenofibrate (TRICOR) 145 MG tablet TAKE 1 TABLET (145 MG TOTAL) BY MOUTH DAILY. 90 tablet 3  . fish oil-omega-3 fatty acids 1000 MG capsule Take 1 g by mouth daily.      Marland Kitchen labetalol (NORMODYNE) 200 MG tablet TAKE 2 TABLETS (400 MG TOTAL) BY MOUTH 3 (THREE) TIMES DAILY. 540 tablet 3  . labetalol (NORMODYNE) 200 MG tablet TAKE 2 TABLETS (400 MG TOTAL) BY MOUTH 3 (THREE) TIMES DAILY. 540 tablet 3  . Multiple Vitamins-Minerals (CENTRUM SILVER) tablet Take 1 tablet by mouth daily.      . naproxen sodium (ANAPROX) 220 MG tablet Take 220 mg by mouth 2 (two) times daily with a meal.    . triamterene-hydrochlorothiazide (DYAZIDE) 37.5-25 MG per capsule Take 1 each (1 capsule total) by mouth daily. 90  capsule 3  . vitamin E (VITAMIN E) 400 UNIT capsule Take 400 Units by mouth daily.      Marland Kitchen zolpidem (AMBIEN) 5 MG tablet TAKE 1 TABLET BY MOUTH AT BEDTIME AS NEEDED 30 tablet 2   No facility-administered medications prior to visit.    ROS Review of Systems  Constitutional: Positive for fever. Negative for chills, diaphoresis and fatigue.  HENT: Positive for congestion and sore throat. Negative for postnasal drip, rhinorrhea, sinus pressure, sneezing, trouble swallowing and voice change.   Respiratory: Positive for cough. Negative for chest tightness, shortness of breath and wheezing.   Cardiovascular: Negative for chest pain, palpitations and leg swelling.  Gastrointestinal: Negative for nausea, vomiting and diarrhea.  Musculoskeletal: Positive for myalgias.  Skin: Negative for rash.  Neurological: Negative for dizziness, weakness, numbness and headaches.  Psychiatric/Behavioral: The patient is not nervous/anxious.     Objective:  BP 104/76 mmHg  Pulse 87  Temp(Src) 98.9 F (37.2 C) (Oral)  Ht 5\' 4"  (1.626 m)  Wt 218 lb (98.884 kg)  BMI 37.40 kg/m2  SpO2 96%  Physical Exam  Constitutional: She is oriented to person, place, and time. She appears well-developed and well-nourished. No distress.  HENT:  Head: Normocephalic and atraumatic.  Right Ear: External ear normal.  Left Ear: External ear normal.  Mouth/Throat: No oropharyngeal exudate.  Eyes: EOM are normal. Pupils are equal, round, and reactive to light. Right eye exhibits no  discharge. Left eye exhibits no discharge. No scleral icterus.  Neck: Normal range of motion. Neck supple.  Cardiovascular: Normal rate, regular rhythm and normal heart sounds.  Exam reveals no gallop and no friction rub.   No murmur heard. Pulmonary/Chest: Effort normal and breath sounds normal. No respiratory distress. She has no wheezes. She has no rales. She exhibits no tenderness.  Lymphadenopathy:    She has no cervical adenopathy.   Neurological: She is alert and oriented to person, place, and time.  Skin: Skin is warm. No rash noted. She is diaphoretic.  Psychiatric: She has a normal mood and affect. Her behavior is normal. Judgment and thought content normal.      Assessment & Plan:   Soul was seen today for influenza.  Diagnoses and all orders for this visit:  Flu-like symptoms -     POCT Influenza A/B  Other orders -     oseltamivir (TAMIFLU) 75 MG capsule; Take 1 capsule (75 mg total) by mouth 2 (two) times daily.   I am having [name] start on oseltamivir. I am also having her maintain her vitamin E, CENTRUM SILVER, fish oil-omega-3 fatty acids, vitamin C, naproxen sodium, triamterene-hydrochlorothiazide, labetalol, fenofibrate, atorvastatin, labetalol, citalopram, and zolpidem.  Meds ordered this encounter  Medications  . oseltamivir (TAMIFLU) 75 MG capsule    Sig: Take 1 capsule (75 mg total) by mouth 2 (two) times daily.    Dispense:  10 capsule    Refill:  0    Order Specific Question:  Supervising Provider    Answer:  Crecencio Mc [[id number]]     Follow-up: Return if symptoms worsen or fail to improve.

## 2015-07-01 NOTE — Assessment & Plan Note (Signed)
New onset Positive POCT flu  Tamiflu sent to pharmacy  Prophylaxis sent for husband and mother  Pt has Cough syrup from previous visit at home Gave handout on Flu Advised to stay at home. Note given to pt for work

## 2015-07-01 NOTE — Progress Notes (Signed)
Pre visit review using our clinic review tool, if applicable. No additional management support is needed unless otherwise documented below in the visit note. 

## 2015-07-01 NOTE — Patient Instructions (Addendum)
Take tamiflu as directed (your husband's is at the pharmacy as well)   Influenza, Adult Influenza ("the flu") is a viral infection of the respiratory tract. It occurs more often in winter months because people spend more time in close contact with one another. Influenza can make you feel very sick. Influenza easily spreads from person to person (contagious). CAUSES  Influenza is caused by a virus that infects the respiratory tract. You can catch the virus by breathing in droplets from an infected person's cough or sneeze. You can also catch the virus by touching something that was recently contaminated with the virus and then touching your mouth, nose, or eyes. RISKS AND COMPLICATIONS You may be at risk for a more severe case of influenza if you smoke cigarettes, have diabetes, have chronic heart disease (such as heart failure) or lung disease (such as asthma), or if you have a weakened immune system. Elderly people and pregnant women are also at risk for more serious infections. The most common problem of influenza is a lung infection (pneumonia). Sometimes, this problem can require emergency medical care and may be life threatening. SIGNS AND SYMPTOMS  Symptoms typically last 4 to 10 days and may include:  Fever.  Chills.  Headache, body aches, and muscle aches.  Sore throat.  Chest discomfort and cough.  Poor appetite.  Weakness or feeling tired.  Dizziness.  Nausea or vomiting. DIAGNOSIS  Diagnosis of influenza is often made based on your history and a physical exam. A nose or throat swab test can be done to confirm the diagnosis. TREATMENT  In mild cases, influenza goes away on its own. Treatment is directed at relieving symptoms. For more severe cases, your health care provider may prescribe antiviral medicines to shorten the sickness. Antibiotic medicines are not effective because the infection is caused by a virus, not by bacteria. HOME CARE INSTRUCTIONS  Take medicines only  as directed by your health care provider.  Use a cool mist humidifier to make breathing easier.  Get plenty of rest until your temperature returns to normal. This usually takes 3 to 4 days.  Drink enough fluid to keep your urine clear or pale yellow.  Cover yourmouth and nosewhen coughing or sneezing,and wash your handswellto prevent thevirusfrom spreading.  Stay homefromwork orschool untilthe fever is gonefor at least 51full day. PREVENTION  An annual influenza vaccination (flu shot) is the best way to avoid getting influenza. An annual flu shot is now routinely recommended for all adults in the [place] IF:  You experiencechest pain, yourcough worsens,or you producemore mucus.  Youhave nausea,vomiting, ordiarrhea.  Your fever returns or gets worse. SEEK IMMEDIATE MEDICAL CARE IF:  You havetrouble breathing, you become short of breath,or your skin ornails becomebluish.  You have severe painor stiffnessin the neck.  You develop a sudden headache, or pain in the face or ear.  You have nausea or vomiting that you cannot control. MAKE SURE YOU:   Understand these instructions.  Will watch your condition.  Will get help right away if you are not doing well or get worse.   This information is not intended to replace advice given to you by your health care provider. Make sure you discuss any questions you have with your health care provider.   Document Released: 04/22/2000 Document Revised: 05/16/2014 Document Reviewed: 07/25/2011 Elsevier Interactive Patient Education [employer].

## 2015-07-02 NOTE — Telephone Encounter (Signed)
Fine with me

## 2015-07-02 NOTE — Telephone Encounter (Signed)
Will you call this nice lady and set up a new appointment in June with myself. Thanks!

## 2015-07-22 ENCOUNTER — Telehealth: Payer: Self-pay | Admitting: Internal Medicine

## 2015-07-22 NOTE — Telephone Encounter (Signed)
Pt called stating pharmacy has been sending a request in for zolpidem (AMBIEN) 5 MG tablet [id number] She has been out for a week now.  Pharmacy is [facility] on [address]

## 2015-07-23 ENCOUNTER — Other Ambulatory Visit: Payer: Self-pay | Admitting: Nurse Practitioner

## 2015-07-23 MED ORDER — ZOLPIDEM TARTRATE 5 MG PO TABS
5.0000 mg | ORAL_TABLET | Freq: Every evening | ORAL | Status: DC | PRN
Start: 2015-07-23 — End: 2015-08-19

## 2015-07-23 NOTE — Telephone Encounter (Signed)
It would appear she is establishing with [name] so will forward.

## 2015-07-23 NOTE — Telephone Encounter (Signed)
Having [name] fax to her pharmacy today. Will cover until meeting in June for CPE

## 2015-08-12 ENCOUNTER — Telehealth: Payer: Self-pay | Admitting: *Deleted

## 2015-08-12 NOTE — Telephone Encounter (Signed)
So fenofibrate (the generic for Tricor) is a preferred covered drug according to the formulary search. Is she getting the brand and not the generic?

## 2015-08-12 NOTE — Telephone Encounter (Signed)
Patient requested a call, from [facility] about the medication tricor.  Pt Contact [phone]

## 2015-08-12 NOTE — Telephone Encounter (Signed)
Patient has been taking Lipitor and Tricor for 9 years. The tricor is now $ 50 at the pharmacy and she wants to know if she can go off of this or can it changed to a cheaper medication.

## 2015-08-13 NOTE — Telephone Encounter (Signed)
Could not get in touch with patient so called pharmacy. Patient has been getting the fenofibrate.

## 2015-08-14 ENCOUNTER — Other Ambulatory Visit: Payer: Self-pay | Admitting: Nurse Practitioner

## 2015-08-17 NOTE — Telephone Encounter (Signed)
Left a Detailed message to call the office. Thanks

## 2015-08-17 NOTE — Telephone Encounter (Signed)
It looks like she can stay on it and get it for $25 at [facility] with the GoodRx card- she can get online at [email or web address] and print off and take to pharmacy.

## 2015-08-17 NOTE — Telephone Encounter (Signed)
Patient returned call and requested to be called back at [phone]

## 2015-08-17 NOTE — Telephone Encounter (Signed)
Spoke with the patient.  She was very happy to hear that there are options.  She had switched insurances and the cost went from 10 to 50.  She will try the Good.Rx card.  Thanks

## 2015-08-19 ENCOUNTER — Other Ambulatory Visit: Payer: Self-pay

## 2015-08-19 MED ORDER — TRIAMTERENE-HCTZ 37.5-25 MG PO CAPS: 1.0000 | ORAL_CAPSULE | Freq: Every day | ORAL | Status: DC

## 2015-08-19 MED ORDER — ATORVASTATIN CALCIUM 40 MG PO TABS: ORAL_TABLET | ORAL | Status: DC

## 2015-08-19 MED ORDER — ZOLPIDEM TARTRATE 5 MG PO TABS: 5.0000 mg | ORAL_TABLET | Freq: Every evening | ORAL | Status: DC | PRN

## 2015-08-19 MED ORDER — LABETALOL HCL 200 MG PO TABS: ORAL_TABLET | ORAL | Status: DC

## 2015-08-19 MED ORDER — FENOFIBRATE 145 MG PO TABS: ORAL_TABLET | ORAL | Status: DC

## 2015-09-07 ENCOUNTER — Encounter: Payer: Self-pay | Admitting: Nurse Practitioner

## 2015-09-07 ENCOUNTER — Ambulatory Visit (INDEPENDENT_AMBULATORY_CARE_PROVIDER_SITE_OTHER): Payer: BLUE CROSS/BLUE SHIELD | Admitting: Nurse Practitioner

## 2015-09-07 VITALS — BP 118/80 | HR 84 | Temp 97.7°F | Ht 63.75 in | Wt 223.1 lb

## 2015-09-07 DIAGNOSIS — R0683 Snoring: Secondary | ICD-10-CM | POA: Diagnosis not present

## 2015-09-07 DIAGNOSIS — G4700 Insomnia, unspecified: Secondary | ICD-10-CM

## 2015-09-07 MED ORDER — ZOLPIDEM TARTRATE 5 MG PO TABS: 5.0000 mg | ORAL_TABLET | Freq: Every evening | ORAL | Status: DC | PRN

## 2015-09-07 NOTE — Assessment & Plan Note (Signed)
Discussed snoring with pt Pt wants to try mechanical dilation devices first before sleep study

## 2015-09-07 NOTE — Progress Notes (Signed)
Pre visit review using our clinic review tool, if applicable. No additional management support is needed unless otherwise documented below in the visit note. 

## 2015-09-07 NOTE — Progress Notes (Signed)
Patient ID: [name], female    DOB: 1965-12-12  Age: 50 y.o. MRN: [id number]  CC: New Patient (Initial Visit)   HPI [name] presents for transferring care.   1) Ambien is helpful and would like to see if she can do 90 day through [facility] mail.   2) Snoring at night- husband notices  Has been for years  Snore guards- not helpful  Flonase at night  Working on healthy diet and exercise   History [name] has a past medical history of GLUCOSE INTOLERANCE; Hypercalcemia; OBESITY; COMMON MIGRAINE; INSOMNIA; HYPERLIPIDEMIA; HYPERTENSION; RENAL INSUFFICIENCY; and ALLERGIC RHINITIS.   She has past surgical history that includes Breast surgery (1998) and hypertension post partum (09/2006).   Her family history includes Diabetes in her maternal grandfather; Hypertension in her mother; Other in her father.She reports that she quit smoking about 10 years ago. She does not have any smokeless tobacco history on file. She reports that she drinks alcohol. She reports that she does not use illicit drugs.  Outpatient Prescriptions Prior to Visit  Medication Sig Dispense Refill  . Ascorbic Acid (VITAMIN C) 1000 MG tablet Take 1,000 mg by mouth daily.      Marland Kitchen atorvastatin (LIPITOR) 40 MG tablet TAKE 1 TABLET (40 MG TOTAL) BY MOUTH DAILY. 90 tablet 3  . citalopram (CELEXA) 10 MG tablet Take 1 tablet (10 mg total) by mouth daily. 90 tablet 2  . fenofibrate (TRICOR) 145 MG tablet TAKE 1 TABLET (145 MG TOTAL) BY MOUTH DAILY. 90 tablet 3  . fish oil-omega-3 fatty acids 1000 MG capsule Take 1 g by mouth daily.      Marland Kitchen labetalol (NORMODYNE) 200 MG tablet TAKE 2 TABLETS (400 MG TOTAL) BY MOUTH 3 (THREE) TIMES DAILY. 540 tablet 3  . Multiple Vitamins-Minerals (CENTRUM SILVER) tablet Take 1 tablet by mouth daily.      . naproxen sodium (ANAPROX) 220 MG tablet Take 220 mg by mouth 2 (two) times daily with a meal.    . triamterene-hydrochlorothiazide (DYAZIDE) 37.5-25 MG capsule Take 1 each (1 capsule  total) by mouth daily. 90 capsule 3  . vitamin E (VITAMIN E) 400 UNIT capsule Take 400 Units by mouth daily.      Marland Kitchen zolpidem (AMBIEN) 5 MG tablet Take 1 tablet (5 mg total) by mouth at bedtime as needed. 30 tablet 2  . labetalol (NORMODYNE) 200 MG tablet TAKE 2 TABLETS (400 MG TOTAL) BY MOUTH 3 (THREE) TIMES DAILY. 540 tablet 3  . oseltamivir (TAMIFLU) 75 MG capsule Take 1 capsule (75 mg total) by mouth 2 (two) times daily. 10 capsule 0   No facility-administered medications prior to visit.    ROS Review of Systems  Constitutional: Negative for fever, chills, diaphoresis, activity change, appetite change, fatigue and unexpected weight change.  Respiratory: Negative for apnea, chest tightness and shortness of breath.   Cardiovascular: Negative for chest pain.  Gastrointestinal: Negative for nausea, vomiting and diarrhea.  Neurological: Negative for headaches.  Psychiatric/Behavioral: Negative for sleep disturbance. The patient is nervous/anxious.     Objective:  BP 118/80 mmHg  Pulse 84  Temp(Src) 97.7 F (36.5 C) (Oral)  Ht 5' 3.75" (1.619 m)  Wt 223 lb 1.9 oz (101.207 kg)  BMI 38.61 kg/m2  SpO2 96%  Physical Exam  Constitutional: She is oriented to person, place, and time. She appears well-developed and well-nourished. No distress.  HENT:  Head: Normocephalic and atraumatic.  Right Ear: External ear normal.  Left Ear: External ear normal.  Cardiovascular: Normal rate, regular rhythm and normal heart sounds.   Pulmonary/Chest: Effort normal and breath sounds normal. No respiratory distress. She has no wheezes. She has no rales. She exhibits no tenderness.  Neurological: She is alert and oriented to person, place, and time. No cranial nerve deficit. She exhibits normal muscle tone. Coordination normal.  Skin: Skin is warm and dry. No rash noted. She is not diaphoretic.  Psychiatric: She has a normal mood and affect. Her behavior is normal. Judgment and thought content normal.    Assessment & Plan:   [name] was seen today for new patient (initial visit).  Diagnoses and all orders for this visit:  INSOMNIA  Snoring  Other orders -     zolpidem (AMBIEN) 5 MG tablet; Take 1 tablet (5 mg total) by mouth at bedtime as needed.  I have discontinued [name]'s oseltamivir. I am also having her maintain her vitamin E, CENTRUM SILVER, fish oil-omega-3 fatty acids, vitamin C, naproxen sodium, labetalol, citalopram, triamterene-hydrochlorothiazide, atorvastatin, fenofibrate, and zolpidem.  Meds ordered this encounter  Medications  . zolpidem (AMBIEN) 5 MG tablet    Sig: Take 1 tablet (5 mg total) by mouth at bedtime as needed.    Dispense:  90 tablet    Refill:  0    Order Specific Question:  Supervising Provider    Answer:  Crecencio Mc [[id number]]     Follow-up: Return if symptoms worsen or fail to improve.

## 2015-09-07 NOTE — Assessment & Plan Note (Signed)
Ambien 90 days sent to [facility] as requested.  Continue prn night usage

## 2015-09-07 NOTE — Patient Instructions (Signed)
Sleep Apnea  Sleep apnea is a sleep disorder characterized by abnormal pauses in breathing while you sleep. When your breathing pauses, the level of oxygen in your blood decreases. This causes you to move out of deep sleep and into light sleep. As a result, your quality of sleep is poor, and the system that carries your blood throughout your body (cardiovascular system) experiences stress. If sleep apnea remains untreated, the following conditions can develop:  High blood pressure (hypertension).  Coronary artery disease.  Inability to achieve or maintain an erection (impotence).  Impairment of your thought process (cognitive dysfunction). There are three types of sleep apnea: 1. Obstructive sleep apnea--Pauses in breathing during sleep because of a blocked airway. 2. Central sleep apnea--Pauses in breathing during sleep because the area of the brain that controls your breathing does not send the correct signals to the muscles that control breathing. 3. Mixed sleep apnea--A combination of both obstructive and central sleep apnea. RISK FACTORS The following risk factors can increase your risk of developing sleep apnea:  Being overweight.  Smoking.  Having narrow passages in your nose and throat.  Being of older age.  Being female.  Alcohol use.  Sedative and tranquilizer use.  Ethnicity. Among individuals younger than 35 years, African Americans are at increased risk of sleep apnea. SYMPTOMS   Difficulty staying asleep.  Daytime sleepiness and fatigue.  Loss of energy.  Irritability.  Loud, heavy snoring.  Morning headaches.  Trouble concentrating.  Forgetfulness.  Decreased interest in sex.  Unexplained sleepiness. DIAGNOSIS  In order to diagnose sleep apnea, your caregiver will perform a physical examination. A sleep study done in the comfort of your own home may be appropriate if you are otherwise healthy. Your caregiver may also recommend that you spend the  night in a sleep lab. In the sleep lab, several monitors record information about your heart, lungs, and brain while you sleep. Your leg and arm movements and blood oxygen level are also recorded. TREATMENT The following actions may help to resolve mild sleep apnea:  Sleeping on your side.   Using a decongestant if you have nasal congestion.   Avoiding the use of depressants, including alcohol, sedatives, and narcotics.   Losing weight and modifying your diet if you are overweight. There also are devices and treatments to help open your airway:  Oral appliances. These are custom-made mouthpieces that shift your lower jaw forward and slightly open your bite. This opens your airway.  Devices that create positive airway pressure. This positive pressure "splints" your airway open to help you breathe better during sleep. The following devices create positive airway pressure:  Continuous positive airway pressure (CPAP) device. The CPAP device creates a continuous level of air pressure with an air pump. The air is delivered to your airway through a mask while you sleep. This continuous pressure keeps your airway open.  Nasal expiratory positive airway pressure (EPAP) device. The EPAP device creates positive air pressure as you exhale. The device consists of single-use valves, which are inserted into each nostril and held in place by adhesive. The valves create very little resistance when you inhale but create much more resistance when you exhale. That increased resistance creates the positive airway pressure. This positive pressure while you exhale keeps your airway open, making it easier to breath when you inhale again.  Bilevel positive airway pressure (BPAP) device. The BPAP device is used mainly in patients with central sleep apnea. This device is similar to the CPAP device because   it also uses an air pump to deliver continuous air pressure through a mask. However, with the BPAP machine, the  pressure is set at two different levels. The pressure when you exhale is lower than the pressure when you inhale.  Surgery. Typically, surgery is only done if you cannot comply with less invasive treatments or if the less invasive treatments do not improve your condition. Surgery involves removing excess tissue in your airway to create a wider passage way.   This information is not intended to replace advice given to you by your health care provider. Make sure you discuss any questions you have with your health care provider.   Document Released: 04/15/2002 Document Revised: 05/16/2014 Document Reviewed: 09/01/2011 Elsevier Interactive Patient Education 2016 Elsevier Inc.   

## 2015-11-02 ENCOUNTER — Encounter: Payer: BLUE CROSS/BLUE SHIELD | Admitting: Internal Medicine

## 2015-11-15 ENCOUNTER — Other Ambulatory Visit: Payer: Self-pay | Admitting: Internal Medicine

## 2015-11-19 DIAGNOSIS — J0101 Acute recurrent maxillary sinusitis: Secondary | ICD-10-CM | POA: Diagnosis not present

## 2015-12-15 ENCOUNTER — Telehealth: Payer: Self-pay | Admitting: Emergency Medicine

## 2015-12-15 NOTE — Telephone Encounter (Signed)
Pt called about a no show fee in 11/02/2015. She transferred to [facility] office in May/2017. Can we have that fee taken off. Thanks.

## 2015-12-23 NOTE — Telephone Encounter (Signed)
email sent to billing to remove   °

## 2016-01-21 NOTE — Telephone Encounter (Signed)
Patient has received another bill for same thing.  Patient is afraid this is going to go against her credit.  Please follow up with billing in regard.  Thanks!

## 2016-01-27 NOTE — Telephone Encounter (Signed)
Email sent to billing to void No Show fee for [facility] 11/02/15, I will monitor her account to ensure it is removed.

## 2016-01-28 NOTE — Telephone Encounter (Signed)
No show fee has been voided, patient has a zero balance.

## 2016-01-29 NOTE — Telephone Encounter (Signed)
Notified patient.

## 2016-02-10 ENCOUNTER — Other Ambulatory Visit: Payer: Self-pay | Admitting: Internal Medicine

## 2016-03-26 ENCOUNTER — Other Ambulatory Visit: Payer: Self-pay | Admitting: Nurse Practitioner

## 2016-03-29 NOTE — Telephone Encounter (Signed)
Last filled 02/16/16. Last OV 09/07/15 with Lorane Gell. Has no appointment scheduled to establish care with another provider.

## 2016-03-30 NOTE — Telephone Encounter (Signed)
faxed

## 2016-04-11 DIAGNOSIS — J019 Acute sinusitis, unspecified: Secondary | ICD-10-CM | POA: Diagnosis not present

## 2016-04-11 DIAGNOSIS — J309 Allergic rhinitis, unspecified: Secondary | ICD-10-CM | POA: Diagnosis not present

## 2016-04-28 DIAGNOSIS — J309 Allergic rhinitis, unspecified: Secondary | ICD-10-CM | POA: Diagnosis not present

## 2016-04-28 DIAGNOSIS — J019 Acute sinusitis, unspecified: Secondary | ICD-10-CM | POA: Diagnosis not present

## 2016-05-02 ENCOUNTER — Other Ambulatory Visit: Payer: Self-pay | Admitting: Family Medicine

## 2016-05-17 ENCOUNTER — Telehealth: Payer: Self-pay | Admitting: Internal Medicine

## 2016-05-17 NOTE — Telephone Encounter (Signed)
Left vm for pt to call back, need to know if pt can make it on 05/20/16 @ 3pm?

## 2016-05-17 NOTE — Telephone Encounter (Signed)
Ok with me 

## 2016-05-17 NOTE — Telephone Encounter (Signed)
Pt request to transfer from [place] to [place] due to relocation now. Please advise, pt need to be seen for arm pain.

## 2016-05-20 ENCOUNTER — Ambulatory Visit (INDEPENDENT_AMBULATORY_CARE_PROVIDER_SITE_OTHER): Payer: BLUE CROSS/BLUE SHIELD | Admitting: Internal Medicine

## 2016-05-20 VITALS — BP 136/88 | HR 85 | Temp 98.1°F | Resp 16 | Wt 232.0 lb

## 2016-05-20 DIAGNOSIS — E7800 Pure hypercholesterolemia, unspecified: Secondary | ICD-10-CM

## 2016-05-20 DIAGNOSIS — G4700 Insomnia, unspecified: Secondary | ICD-10-CM

## 2016-05-20 DIAGNOSIS — IMO0001 Reserved for inherently not codable concepts without codable children: Secondary | ICD-10-CM

## 2016-05-20 DIAGNOSIS — I10 Essential (primary) hypertension: Secondary | ICD-10-CM

## 2016-05-20 DIAGNOSIS — E6609 Other obesity due to excess calories: Secondary | ICD-10-CM

## 2016-05-20 DIAGNOSIS — N189 Chronic kidney disease, unspecified: Secondary | ICD-10-CM

## 2016-05-20 DIAGNOSIS — R7303 Prediabetes: Secondary | ICD-10-CM | POA: Diagnosis not present

## 2016-05-20 DIAGNOSIS — Z6841 Body Mass Index (BMI) 40.0 and over, adult: Secondary | ICD-10-CM

## 2016-05-20 MED ORDER — ZOLPIDEM TARTRATE 5 MG PO TABS: ORAL_TABLET | ORAL | 2 refills | Status: DC

## 2016-05-20 NOTE — Patient Instructions (Addendum)

## 2016-05-20 NOTE — Progress Notes (Signed)
Subjective:    Patient ID: [name], female    DOB: July 31, 1965, 51 y.o.   MRN: [id number]  HPI She is here to establish with a new pcp.    She just recently sat down with her husband and told him she needed to make some changes.  She will start exercising twice a week at the gym and he will need to do some more things at the house.  She will also exercise on the weekends.  She has one son.   Hypertension: She is taking her medication daily. She is compliant with a low sodium diet.  She denies chest pain, palpitations, edema, shortness of breath and regular headaches. She is not exercising regularly, but plans on starting.      Hyperlipidemia: She is taking her medication daily. She is compliant with a low fat/cholesterol diet. She is not exercising regularly. She denies myalgias.   Prediabetes:  She is compliant with a low sugar/carbohydrate diet.  She is not currently exercising regularly, but is active during the day.  Insomnia:  She takes ambien 5 mg nightly.  She is unable to sleep without it.  She tolerates it well and denies side effects.  She has been on the medication for years.  ? Tendinitis in right elbow:  She had something similar in the left elbow.  It started several weeks ago, but the past two weeks have been more severe. She is unsure what caused it.  Any movement flares the pain.  She takes aleve daily to begin with and it does not hep.  She tried an elbow brace and it did not. The pain is located on the lateral part of the elbow.  It radiates distally on occasion. She tried topical treatments.    CKD:  She did have some kidney damage just after her pregnancy and did see a kidney specialist at that time.  She drinks lots of water.  She takes nsaids only as needed.    Medications and allergies reviewed with patient and updated if appropriate.  Patient Active Problem List   Diagnosis Date Noted  . Prediabetes 05/20/2016  . Snoring 09/07/2015  . Insomnia  06/02/2009  . Obesity 05/19/2008  . Chronic kidney disease 05/19/2008  . Hyperlipidemia 04/17/2008  . COMMON MIGRAINE 04/17/2008  . Essential hypertension 04/17/2008  . ALLERGIC RHINITIS 04/17/2008    Current Outpatient Prescriptions on File Prior to Visit  Medication Sig Dispense Refill  . Ascorbic Acid (VITAMIN C) 1000 MG tablet Take 1,000 mg by mouth daily.      Marland Kitchen atorvastatin (LIPITOR) 40 MG tablet TAKE 1 TABLET BY MOUTH EVERY DAY 90 tablet 2  . citalopram (CELEXA) 10 MG tablet Take 1 tablet (10 mg total) by mouth daily. 90 tablet 2  . fenofibrate (TRICOR) 145 MG tablet TAKE 1 TABLET BY MOUTH EVERY DAY 90 tablet 2  . fish oil-omega-3 fatty acids 1000 MG capsule Take 1 g by mouth daily.      Marland Kitchen labetalol (NORMODYNE) 200 MG tablet TAKE 2 TABLETS (400 MG TOTAL) BY MOUTH 3 (THREE) TIMES DAILY. 540 tablet 2  . Multiple Vitamins-Minerals (CENTRUM SILVER) tablet Take 1 tablet by mouth daily.      . naproxen sodium (ANAPROX) 220 MG tablet Take 220 mg by mouth 2 (two) times daily with a meal.    . triamterene-hydrochlorothiazide (DYAZIDE) 37.5-25 MG capsule TAKE ONE CAPSULE BY MOUTH EVERY DAY 90 capsule 2  . vitamin E (VITAMIN E) 400 UNIT capsule Take  400 Units by mouth daily.       No current facility-administered medications on file prior to visit.     Past Medical History:  Diagnosis Date  . ALLERGIC RHINITIS   . COMMON MIGRAINE   . GLUCOSE INTOLERANCE   . Hypercalcemia   . HYPERLIPIDEMIA   . HYPERTENSION   . INSOMNIA   . OBESITY   . RENAL INSUFFICIENCY     Past Surgical History:  Procedure Laterality Date  . BREAST SURGERY  1998   Implants  . hypertension post partum  09/2006   Not precalmpsia    Social History   Social History  . Marital status: Married    Spouse name: N/A  . Number of children: N/A  . Years of education: N/A   Social History Main Topics  . Smoking status: Former Smoker    Quit date: 05/09/2005  . Smokeless tobacco: Not on file     Comment:  Married, lives with spouse and son. 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 Alcohol use Yes     Comment: social  . Drug use: No  . Sexual activity: Not on file   Other Topics Concern  . Not on file   Social History Narrative  . No narrative on file    Family History  Problem Relation Age of Onset  . Diabetes Maternal Grandfather   . Hypertension Mother   . Other Father     MVA    Review of Systems  Constitutional: Negative for chills and fever.       Hot flashes  Respiratory: Negative for cough, shortness of breath and wheezing.   Cardiovascular: Negative for chest pain, palpitations and leg swelling.  Neurological: Negative for light-headedness and headaches.       Objective:   Vitals:   05/20/16 1510  BP: 136/88  Pulse: 85  Resp: 16  Temp: 98.1 F (36.7 C)   Filed Weights   05/20/16 1510  Weight: 232 lb (105.2 kg)   Body mass index is 40.14 kg/m.  Wt Readings from Last 3 Encounters:  05/20/16 232 lb (105.2 kg)  09/07/15 223 lb 1.9 oz (101.2 kg)  07/01/15 218 lb (98.9 kg)     Physical Exam Constitutional: Appears well-developed and well-nourished. No distress.  HENT:  Head: Normocephalic and atraumatic.  Neck: Neck supple. No tracheal deviation present. No thyromegaly present.  No cervical lymphadenopathy Cardiovascular: Normal rate, regular rhythm and normal heart sounds.   No murmur heard. No carotid bruit .  No edema Pulmonary/Chest: Effort normal and breath sounds normal. No respiratory distress. No has no wheezes. No rales.  Skin: Skin is warm and dry. Not diaphoretic.  Psychiatric: Normal mood and affect. Behavior is normal.         Assessment & Plan:   See Problem List for Assessment and Plan of chronic medical problems.   FU in 6 months

## 2016-05-20 NOTE — Progress Notes (Signed)
Pre visit review using our clinic review tool, if applicable. No additional management support is needed unless otherwise documented below in the visit note. 

## 2016-05-21 ENCOUNTER — Encounter: Payer: Self-pay | Admitting: Internal Medicine

## 2016-05-21 NOTE — Assessment & Plan Note (Signed)
Check a1c Low sugar / carb diet Stressed regular exercise, keeping weight loss

## 2016-05-21 NOTE — Assessment & Plan Note (Signed)
BP Readings from Last 3 Encounters:  05/20/16 136/88  09/07/15 118/80  07/01/15 104/76   BP well controlled Current regimen effective and well tolerated Continue current medications at current doses cmp

## 2016-05-21 NOTE — Assessment & Plan Note (Signed)
Stable on ambien nightly - tolerates it well w/o side effects Unable to sleep without it Discussed concerns with taking it nightly Will continue for now

## 2016-05-21 NOTE — Assessment & Plan Note (Signed)
GFR has been stable Had kidney damage after the birth of her son - saw a nephrologist at that time Also on dyazide cmp - will monitor Work on weight loss Avoid nsaids Already drinks a lot of water

## 2016-05-21 NOTE — Assessment & Plan Note (Signed)
Check lipid panel  Continue daily statin Regular exercise and healthy diet encouraged  

## 2016-05-21 NOTE — Assessment & Plan Note (Signed)
Stressed weight loss Will start exercising, decrease portions

## 2016-05-23 ENCOUNTER — Encounter: Payer: Self-pay | Admitting: Family

## 2016-05-23 ENCOUNTER — Other Ambulatory Visit (INDEPENDENT_AMBULATORY_CARE_PROVIDER_SITE_OTHER): Payer: BLUE CROSS/BLUE SHIELD

## 2016-05-23 ENCOUNTER — Ambulatory Visit (INDEPENDENT_AMBULATORY_CARE_PROVIDER_SITE_OTHER): Payer: BLUE CROSS/BLUE SHIELD | Admitting: Family

## 2016-05-23 ENCOUNTER — Encounter: Payer: Self-pay | Admitting: Internal Medicine

## 2016-05-23 DIAGNOSIS — E7800 Pure hypercholesterolemia, unspecified: Secondary | ICD-10-CM | POA: Diagnosis not present

## 2016-05-23 DIAGNOSIS — E1169 Type 2 diabetes mellitus with other specified complication: Secondary | ICD-10-CM | POA: Insufficient documentation

## 2016-05-23 DIAGNOSIS — R7303 Prediabetes: Secondary | ICD-10-CM | POA: Diagnosis not present

## 2016-05-23 DIAGNOSIS — E119 Type 2 diabetes mellitus without complications: Secondary | ICD-10-CM | POA: Insufficient documentation

## 2016-05-23 DIAGNOSIS — I10 Essential (primary) hypertension: Secondary | ICD-10-CM | POA: Diagnosis not present

## 2016-05-23 DIAGNOSIS — N189 Chronic kidney disease, unspecified: Secondary | ICD-10-CM

## 2016-05-23 DIAGNOSIS — M7710 Lateral epicondylitis, unspecified elbow: Secondary | ICD-10-CM | POA: Insufficient documentation

## 2016-05-23 DIAGNOSIS — M7711 Lateral epicondylitis, right elbow: Secondary | ICD-10-CM | POA: Diagnosis not present

## 2016-05-23 LAB — CBC WITH DIFFERENTIAL/PLATELET
BASOS ABS: 0 10*3/uL (ref 0.0–0.1)
Basophils Relative: 0.5 % (ref 0.0–3.0)
Eosinophils Absolute: 0.2 10*3/uL (ref 0.0–0.7)
Eosinophils Relative: 3.3 % (ref 0.0–5.0)
HCT: 41.3 % (ref 36.0–46.0)
Hemoglobin: 14.5 g/dL (ref 12.0–15.0)
Lymphocytes Relative: 35.2 % (ref 12.0–46.0)
Lymphs Abs: 2.6 10*3/uL (ref 0.7–4.0)
MCHC: 35.1 g/dL (ref 30.0–36.0)
MCV: 89.2 fl (ref 78.0–100.0)
MONOS PCT: 10.3 % (ref 3.0–12.0)
Monocytes Absolute: 0.8 10*3/uL (ref 0.1–1.0)
Neutro Abs: 3.7 10*3/uL (ref 1.4–7.7)
Neutrophils Relative %: 50.7 % (ref 43.0–77.0)
Platelets: 246 10*3/uL (ref 150.0–400.0)
RBC: 4.64 Mil/uL (ref 3.87–5.11)
RDW: 12.9 % (ref 11.5–15.5)
WBC: 7.3 10*3/uL (ref 4.0–10.5)

## 2016-05-23 LAB — LIPID PANEL
CHOLESTEROL: 149 mg/dL (ref 0–200)
HDL: 31 mg/dL — AB (ref >39.00)
NonHDL: 118.05
Total CHOL/HDL Ratio: 5
Triglycerides: 303 mg/dL — ABNORMAL HIGH (ref 0.0–149.0)
VLDL: 60.6 mg/dL — AB (ref 0.0–40.0)

## 2016-05-23 LAB — COMPREHENSIVE METABOLIC PANEL
ALBUMIN: 4.2 g/dL (ref 3.5–5.2)
ALK PHOS: 43 U/L (ref 39–117)
ALT: 41 U/L — ABNORMAL HIGH (ref 0–35)
AST: 21 U/L (ref 0–37)
BUN: 15 mg/dL (ref 6–23)
CALCIUM: 9.4 mg/dL (ref 8.4–10.5)
CO2: 28 mEq/L (ref 19–32)
Chloride: 102 mEq/L (ref 96–112)
Creatinine, Ser: 0.9 mg/dL (ref 0.40–1.20)
GFR: 70.58 mL/min (ref >60.00)
Glucose, Bld: 162 mg/dL — ABNORMAL HIGH (ref 70–99)
POTASSIUM: 3.5 meq/L (ref 3.5–5.1)
Sodium: 138 mEq/L (ref 135–145)
TOTAL PROTEIN: 6.7 g/dL (ref 6.0–8.3)
Total Bilirubin: 0.3 mg/dL (ref 0.2–1.2)

## 2016-05-23 LAB — HEMOGLOBIN A1C: HEMOGLOBIN A1C: 6.9 % — AB (ref 4.6–6.5)

## 2016-05-23 LAB — TSH: TSH: 1.44 u[IU]/mL (ref 0.35–4.50)

## 2016-05-23 LAB — LDL CHOLESTEROL, DIRECT: Direct LDL: 83 mg/dL

## 2016-05-23 MED ORDER — DICLOFENAC SODIUM 2 % TD SOLN: 1.0000 "application " | Freq: Two times a day (BID) | TRANSDERMAL | 1 refills | Status: DC | PRN

## 2016-05-23 MED ORDER — NAPROXEN-ESOMEPRAZOLE 500-20 MG PO TBEC: 1.0000 | DELAYED_RELEASE_TABLET | Freq: Two times a day (BID) | ORAL | 0 refills | Status: DC | PRN

## 2016-05-23 NOTE — Assessment & Plan Note (Signed)
Symptoms and exam consistent with lateral epicondylitis. Treat conservatively with ice massage, home exercise therapy, and start Pennsaid and Vimovo. Tennis elbow strap as needed. Follow-up if symptoms worsen or do not improve for possible cortisone injection.

## 2016-05-23 NOTE — Patient Instructions (Signed)
Thank you for choosing [facility]!  Ice massage  x 15-20 minutes every 2 hours and as needed or following activity  Pennsaid - Approximately 1/2 packet to the affected site twice daily.   Vimovo - 1 tablet twice per day for the next 5-7 days and then as needed.  Exercises 1-2 times per day as instructed.   You will receive a call from [facility]/other  pharmacy regarding your Pennsaid/Duexis/Vimovo. The medication will be mailed to you and should cost you no more than $10 per item or possibly free depending upon your insurance.   Your prescription(s) have been submitted to your pharmacy or been printed and provided for you. Please take as directed and contact our office if you believe you are having problem(s) with the medication(s) or have any questions.  If your symptoms worsen or fail to improve, please contact our office for further instruction, or in case of emergency go directly to the emergency room at the closest medical facility.    Tennis Elbow Rehab Ask your health care provider which exercises are safe for you. Do exercises exactly as told by your health care provider and adjust them as directed. It is normal to feel mild stretching, pulling, tightness, or discomfort as you do these exercises, but you should stop right away if you feel sudden pain or your pain gets worse. Do not begin these exercises until told by your health care provider. Stretching and range of motion exercises These exercises warm up your muscles and joints and improve the movement and flexibility of your elbow. These exercises also help to relieve pain, numbness, and tingling. Exercise A: Wrist extensor stretch 1. Extend your left / right elbow with your fingers pointing down. 2. Gently pull the palm of your left / right hand toward you until you feel a gentle stretch on the top of your forearm. 3. To increase the stretch, push your left / right hand toward the outer edge or pinkie side of your  forearm. 4. Hold this position for __________ seconds. Repeat __________ times. Complete this exercise __________ times a day. If directed by your health care provider, repeat this stretch except do it with a bent elbow this time. Exercise B: Wrist flexor stretch 1. Extend your left / right elbow and turn your palm upward. 2. Gently pull your left / right palm and fingertips back so your wrist extends and your fingers point more toward the ground. 3. You should feel a gentle stretch on the inside of your forearm. 4. Hold this position for __________ seconds. Repeat __________ times. Complete this exercise __________ times a day. If directed by your health care provider, repeat this stretch except do it with a bent elbow this time. Strengthening exercises These exercises build strength and endurance in your elbow. Endurance is the ability to use your muscles for a long time, even after they get tired. Exercise C: Wrist extensors 1. Sit with your left / right forearm palm-down and fully supported on a table or countertop. Your elbow should be resting below the height of your shoulder. 2. Let your left / right wrist extend over the edge of the surface. 3. Loosely hold a __________ weight or a piece of rubber exercise band or tubing in your left / right hand. Slowly curl your left / right hand up toward your forearm. If you are using band or tubing, hold the band or tubing in place with your other hand to provide resistance. 4. Hold this position for __________  seconds. 5. Slowly return to the starting position. Repeat __________ times. Complete this exercise __________ times a day. Exercise D: Radial deviators 1. Stand with a __________ weight in your left / righthand. Or, sit while holding a rubber exercise band or tubing with your other arm supported on a table or countertop. Position your hand so your thumb is on top. 2. Raise your hand upward in front of you so your thumb travels toward your  forearm, or pull up on the rubber tubing. 3. Hold this position for __________ seconds. 4. Slowly return to the starting position. Repeat __________ times. Complete this exercise __________ times a day. Exercise E: Eccentric wrist extensors 1. Sit with your left / right forearm palm-down and fully supported on a table or countertop. Your elbow should be resting below the height of your shoulder. 2. If told by your health care provider, hold a __________ weight in your hand. 3. Let your left / right wrist extend over the edge of the surface. 4. Use your other hand to lift up your left / right hand toward your forearm. Keep your forearm on the table. 5. Using only the muscles in your left / right hand, slowly lower your hand back down to the starting position. Repeat __________ times. Complete this exercise __________ times a day. This information is not intended to replace advice given to you by your health care provider. Make sure you discuss any questions you have with your health care provider. Document Released: 04/25/2005 Document Revised: 12/30/2015 Document Reviewed: 01/22/2015 Elsevier Interactive Patient Education  2017 [employer].

## 2016-05-23 NOTE — Progress Notes (Signed)
Subjective:    Patient ID: [name], female    DOB: 1965-12-18, 51 y.o.   MRN: [id number]  Chief Complaint  Patient presents with  . Elbow Pain    injection in right elbow    HPI:  [name] is a 51 y.o. female who  has a past medical history of ALLERGIC RHINITIS; COMMON MIGRAINE; GLUCOSE INTOLERANCE; Hypercalcemia; HYPERLIPIDEMIA; HYPERTENSION; INSOMNIA; OBESITY; and RENAL INSUFFICIENCY. and presents today for a follow up.  Recently evaluated in the office and diagnosed with tendinitis of the right elbow that started several weeks ago and has progressively worsened. Described at the time that any movement flares the pain and has been refractory to over the counter anti-inflammatories and creams. Has tried the tennis elbow strap which has not helped very much. Continues to experience pains that are described as sharp, dull and achy. She is right hand dominant and works on a computer regularly. No previous history of injury or new injury/trauma that she can recall. Pain does radiate down her arm on occasion. Severity effects her ability to grab and lift things.    Allergies  Allergen Reactions  . Lisinopril     REACTION: depressed mood  . Olmesartan Medoxomil     REACTION: Headaches, dizziness      Outpatient Medications Prior to Visit  Medication Sig Dispense Refill  . Ascorbic Acid (VITAMIN C) 1000 MG tablet Take 1,000 mg by mouth daily.      Marland Kitchen atorvastatin (LIPITOR) 40 MG tablet TAKE 1 TABLET BY MOUTH EVERY DAY 90 tablet 2  . citalopram (CELEXA) 10 MG tablet Take 1 tablet (10 mg total) by mouth daily. 90 tablet 2  . fenofibrate (TRICOR) 145 MG tablet TAKE 1 TABLET BY MOUTH EVERY DAY 90 tablet 2  . fish oil-omega-3 fatty acids 1000 MG capsule Take 1 g by mouth daily.      Marland Kitchen labetalol (NORMODYNE) 200 MG tablet TAKE 2 TABLETS (400 MG TOTAL) BY MOUTH 3 (THREE) TIMES DAILY. 540 tablet 2  . Multiple Vitamins-Minerals (CENTRUM SILVER) tablet Take 1 tablet by mouth  daily.      . naproxen sodium (ANAPROX) 220 MG tablet Take 220 mg by mouth 2 (two) times daily with a meal.    . triamterene-hydrochlorothiazide (DYAZIDE) 37.5-25 MG capsule TAKE ONE CAPSULE BY MOUTH EVERY DAY 90 capsule 2  . vitamin E (VITAMIN E) 400 UNIT capsule Take 400 Units by mouth daily.      Marland Kitchen zolpidem (AMBIEN) 5 MG tablet 1 tablet by mouth nightly as needed for sleep. 30 tablet 2   No facility-administered medications prior to visit.      Review of Systems  Constitutional: Negative for chills and fever.  Musculoskeletal:       Positive for right elbow pain  Neurological: Positive for weakness. Negative for numbness.      Objective:    BP (!) 142/96 (BP Location: Left Arm, Patient Position: Sitting, Cuff Size: Large)   Pulse 77   Temp 98 F (36.7 C) (Oral)   Resp 16   Ht 5' 3.75" (1.619 m)   Wt 232 lb (105.2 kg)   SpO2 98%   BMI 40.14 kg/m  Nursing note and vital signs reviewed.  Physical Exam  Constitutional: She is oriented to person, place, and time. She appears well-developed and well-nourished. No distress.  Cardiovascular: Normal rate, regular rhythm, normal heart sounds and intact distal pulses.   Pulmonary/Chest: Effort normal and breath sounds normal.  Musculoskeletal:  Right elbow -  no obvious deformity or discoloration with mild edema noted around the lateral elbow/epicondyle. Tenderness elicited over extensor muscle group and lateral condyle. Exacerbated with wrist extension. Distal pulses and sensation are intact and appropriate.  Neurological: She is alert and oriented to person, place, and time.  Skin: Skin is warm and dry.  Psychiatric: She has a normal mood and affect. Her behavior is normal. Judgment and thought content normal.       Assessment & Plan:   Problem List Items Addressed This Visit      Musculoskeletal and Integument   Lateral epicondylitis    Symptoms and exam consistent with lateral epicondylitis. Treat conservatively with ice  massage, home exercise therapy, and start Pennsaid and Vimovo. Tennis elbow strap as needed. Follow-up if symptoms worsen or do not improve for possible cortisone injection.      Relevant Medications   Diclofenac Sodium (PENNSAID) 2 % SOLN   Naproxen-Esomeprazole 500-20 MG TBEC       I am having Ms. Bartunek start on Diclofenac Sodium and Naproxen-Esomeprazole. I am also having her maintain her vitamin E, CENTRUM SILVER, fish oil-omega-3 fatty acids, vitamin C, naproxen sodium, citalopram, atorvastatin, fenofibrate, triamterene-hydrochlorothiazide, labetalol, and zolpidem.   Meds ordered this encounter  Medications  . Diclofenac Sodium (PENNSAID) 2 % SOLN    Sig: Place 1 application onto the skin 2 (two) times daily as needed.    Dispense:  112 g    Refill:  1    Order Specific Question:   Supervising Provider    Answer:   Pricilla Holm A [id number]  . Naproxen-Esomeprazole 500-20 MG TBEC    Sig: Take 1 tablet by mouth 2 (two) times daily as needed.    Dispense:  60 tablet    Refill:  0    Order Specific Question:   Supervising Provider    Answer:   Pricilla Holm A [id number]     Follow-up: Return in about 3 weeks (around 06/13/2016), or if symptoms worsen or fail to improve.  Mauricio Po, FNP

## 2016-06-23 ENCOUNTER — Other Ambulatory Visit: Payer: Self-pay | Admitting: Family

## 2016-06-23 DIAGNOSIS — M7711 Lateral epicondylitis, right elbow: Secondary | ICD-10-CM

## 2016-06-24 DIAGNOSIS — J069 Acute upper respiratory infection, unspecified: Secondary | ICD-10-CM | POA: Diagnosis not present

## 2016-07-11 DIAGNOSIS — E785 Hyperlipidemia, unspecified: Secondary | ICD-10-CM | POA: Diagnosis not present

## 2016-07-11 DIAGNOSIS — J3089 Other allergic rhinitis: Secondary | ICD-10-CM | POA: Diagnosis not present

## 2016-07-11 DIAGNOSIS — M7711 Lateral epicondylitis, right elbow: Secondary | ICD-10-CM | POA: Diagnosis not present

## 2016-07-13 DIAGNOSIS — M7711 Lateral epicondylitis, right elbow: Secondary | ICD-10-CM | POA: Diagnosis not present

## 2016-07-25 ENCOUNTER — Ambulatory Visit (INDEPENDENT_AMBULATORY_CARE_PROVIDER_SITE_OTHER): Payer: BLUE CROSS/BLUE SHIELD | Admitting: Family

## 2016-07-25 ENCOUNTER — Encounter: Payer: Self-pay | Admitting: Family

## 2016-07-25 VITALS — BP 124/80 | HR 85 | Temp 97.9°F | Resp 16 | Ht 63.75 in | Wt 228.0 lb

## 2016-07-25 DIAGNOSIS — J0140 Acute pansinusitis, unspecified: Secondary | ICD-10-CM

## 2016-07-25 DIAGNOSIS — J019 Acute sinusitis, unspecified: Secondary | ICD-10-CM | POA: Insufficient documentation

## 2016-07-25 MED ORDER — AMOXICILLIN-POT CLAVULANATE 875-125 MG PO TABS: 1.0000 | ORAL_TABLET | Freq: Two times a day (BID) | ORAL | 0 refills | Status: DC

## 2016-07-25 MED ORDER — PROMETHAZINE-CODEINE 6.25-10 MG/5ML PO SYRP: 5.0000 mL | ORAL_SOLUTION | Freq: Four times a day (QID) | ORAL | 0 refills | Status: DC | PRN

## 2016-07-25 NOTE — Patient Instructions (Signed)
Thank you for choosing [facility].  SUMMARY AND INSTRUCTIONS:  Medication:  Please start taking Augmentin until completed.  Start taking promethazine-codeine as needed for cough and sleep.  Your prescription(s) have been submitted to your pharmacy or been printed and provided for you. Please take as directed and contact our office if you believe you are having problem(s) with the medication(s) or have any questions.  Follow up:  If your symptoms worsen or fail to improve, please contact our office for further instruction, or in case of emergency go directly to the emergency room at the closest medical facility.    General Recommendations:    Please drink plenty of fluids.  Get plenty of rest   Sleep in humidified air  Use saline nasal sprays  Netti pot   OTC Medications:  Decongestants - helps relieve congestion   Flonase (generic fluticasone) or Nasacort (generic triamcinolone) - please make sure to use the "cross-over" technique at a 45 degree angle towards the opposite eye as opposed to straight up the nasal passageway.   Sudafed (generic pseudoephedrine - Note this is the one that is available behind the pharmacy counter); Products with phenylephrine (-PE) may also be used but is often not as effective as pseudoephedrine.   If you have HIGH BLOOD PRESSURE - Coricidin HBP; AVOID any product that is -D as this contains pseudoephedrine which may increase your blood pressure.  Afrin (oxymetazoline) every 6-8 hours for up to 3 days.   Allergies - helps relieve runny nose, itchy eyes and sneezing   Claritin (generic loratidine), Allegra (fexofenidine), or Zyrtec (generic cyrterizine) for runny nose. These medications should not cause drowsiness.  Note - Benadryl (generic diphenhydramine) may be used however may cause drowsiness  Cough -   Delsym or Robitussin (generic dextromethorphan)  Expectorants - helps loosen mucus to ease removal   Mucinex  (generic guaifenesin) as directed on the package.  Headaches / General Aches   Tylenol (generic acetaminophen) - DO NOT EXCEED 3 grams (3,000 mg) in a 24 hour time period  Advil/Motrin (generic ibuprofen)   Sore Throat -   Salt water gargle   Chloraseptic (generic benzocaine) spray or lozenges / Sucrets (generic dyclonine)    Sinusitis Sinusitis is redness, soreness, and inflammation of the paranasal sinuses. Paranasal sinuses are air pockets within the bones of your face (beneath the eyes, the middle of the forehead, or above the eyes). In healthy paranasal sinuses, mucus is able to drain out, and air is able to circulate through them by way of your nose. However, when your paranasal sinuses are inflamed, mucus and air can become trapped. This can allow bacteria and other germs to grow and cause infection. Sinusitis can develop quickly and last only a short time (acute) or continue over a long period (chronic). Sinusitis that lasts for more than 12 weeks is considered chronic.  CAUSES  Causes of sinusitis include:  Allergies.  Structural abnormalities, such as displacement of the cartilage that separates your nostrils (deviated septum), which can decrease the air flow through your nose and sinuses and affect sinus drainage.  Functional abnormalities, such as when the small hairs (cilia) that line your sinuses and help remove mucus do not work properly or are not present. SIGNS AND SYMPTOMS  Symptoms of acute and chronic sinusitis are the same. The primary symptoms are pain and pressure around the affected sinuses. Other symptoms include:  Upper toothache.  Earache.  Headache.  Bad breath.  Decreased sense of smell and taste.  A cough, which worsens when you are lying flat.  Fatigue.  Fever.  Thick drainage from your nose, which often is green and may contain pus (purulent).  Swelling and warmth over the affected sinuses. DIAGNOSIS  Your health care provider will  perform a physical exam. During the exam, your health care provider may:  Look in your nose for signs of abnormal growths in your nostrils (nasal polyps).  Tap over the affected sinus to check for signs of infection.  View the inside of your sinuses (endoscopy) using an imaging device that has a light attached (endoscope). If your health care provider suspects that you have chronic sinusitis, one or more of the following tests may be recommended:  Allergy tests.  Nasal culture. A sample of mucus is taken from your nose, sent to a lab, and screened for bacteria.  Nasal cytology. A sample of mucus is taken from your nose and examined by your health care provider to determine if your sinusitis is related to an allergy. TREATMENT  Most cases of acute sinusitis are related to a viral infection and will resolve on their own within 10 days. Sometimes medicines are prescribed to help relieve symptoms (pain medicine, decongestants, nasal steroid sprays, or saline sprays).  However, for sinusitis related to a bacterial infection, your health care provider will prescribe antibiotic medicines. These are medicines that will help kill the bacteria causing the infection.  Rarely, sinusitis is caused by a fungal infection. In theses cases, your health care provider will prescribe antifungal medicine. For some cases of chronic sinusitis, surgery is needed. Generally, these are cases in which sinusitis recurs more than 3 times per year, despite other treatments. HOME CARE INSTRUCTIONS   Drink plenty of water. Water helps thin the mucus so your sinuses can drain more easily.  Use a humidifier.  Inhale steam 3 to 4 times a day (for example, sit in the bathroom with the shower running).  Apply a warm, moist washcloth to your face 3 to 4 times a day, or as directed by your health care provider.  Use saline nasal sprays to help moisten and clean your sinuses.  Take medicines only as directed by your health  care provider.  If you were prescribed either an antibiotic or antifungal medicine, finish it all even if you start to feel better. SEEK IMMEDIATE MEDICAL CARE IF:  You have increasing pain or severe headaches.  You have nausea, vomiting, or drowsiness.  You have swelling around your face.  You have vision problems.  You have a stiff neck.  You have difficulty breathing. MAKE SURE YOU:   Understand these instructions.  Will watch your condition.  Will get help right away if you are not doing well or get worse. Document Released: 04/25/2005 Document Revised: 09/09/2013 Document Reviewed: 05/10/2011 [facility] Patient Information 2015 [place], [place]. This information is not intended to replace advice given to you by your health care provider. Make sure you discuss any questions you have with your health care provider.

## 2016-07-25 NOTE — Progress Notes (Signed)
Subjective:    Patient ID: [name], female    DOB: April 19, 1966, 51 y.o.   MRN: [id number]  Chief Complaint  Patient presents with  . Cough    x1 week, cough, congestion, headache, sweating    HPI:  [name] is a 51 y.o. female who  has a past medical history of ALLERGIC RHINITIS; COMMON MIGRAINE; GLUCOSE INTOLERANCE; Hypercalcemia; HYPERLIPIDEMIA; HYPERTENSION; INSOMNIA; OBESITY; and RENAL INSUFFICIENCY. and presents today for an acute office visit.  This is a new problem. Associated symptom of cough, congestion, headache, and sweating a been going on for approximately one week. Denies fevers. Modifying factors include Airborne, sinus medications and rinse which have not helped very much. Course of the symptoms have progressively worsened since initial onset. No recent antibiotics.   Allergies  Allergen Reactions  . Lisinopril     REACTION: depressed mood  . Olmesartan Medoxomil     REACTION: Headaches, dizziness      Outpatient Medications Prior to Visit  Medication Sig Dispense Refill  . Ascorbic Acid (VITAMIN C) 1000 MG tablet Take 1,000 mg by mouth daily.      Marland Kitchen atorvastatin (LIPITOR) 40 MG tablet TAKE 1 TABLET BY MOUTH EVERY DAY 90 tablet 2  . citalopram (CELEXA) 10 MG tablet Take 1 tablet (10 mg total) by mouth daily. 90 tablet 2  . Diclofenac Sodium (PENNSAID) 2 % SOLN Place 1 application onto the skin 2 (two) times daily as needed. 112 g 1  . fenofibrate (TRICOR) 145 MG tablet TAKE 1 TABLET BY MOUTH EVERY DAY 90 tablet 2  . fish oil-omega-3 fatty acids 1000 MG capsule Take 1 g by mouth daily.      Marland Kitchen labetalol (NORMODYNE) 200 MG tablet TAKE 2 TABLETS (400 MG TOTAL) BY MOUTH 3 (THREE) TIMES DAILY. 540 tablet 2  . Multiple Vitamins-Minerals (CENTRUM SILVER) tablet Take 1 tablet by mouth daily.      . naproxen sodium (ANAPROX) 220 MG tablet Take 220 mg by mouth 2 (two) times daily with a meal.    . triamterene-hydrochlorothiazide (DYAZIDE) 37.5-25 MG  capsule TAKE ONE CAPSULE BY MOUTH EVERY DAY 90 capsule 2  . VIMOVO 500-20 MG TBEC TAKE ONE TABLET BY MOUTH TWICE DAILY AS NEEDED 60 tablet 0  . vitamin E (VITAMIN E) 400 UNIT capsule Take 400 Units by mouth daily.      Marland Kitchen zolpidem (AMBIEN) 5 MG tablet 1 tablet by mouth nightly as needed for sleep. 30 tablet 2   No facility-administered medications prior to visit.      Review of Systems  Constitutional: Positive for fever. Negative for chills.  HENT: Positive for congestion, ear pain, sinus pain, sinus pressure and sore throat.   Respiratory: Positive for cough. Negative for chest tightness and shortness of breath.   Neurological: Positive for headaches.      Objective:    BP 124/80 (BP Location: Left Arm, Patient Position: Sitting, Cuff Size: Large)   Pulse 85   Temp 97.9 F (36.6 C) (Oral)   Resp 16   Ht 5' 3.75" (1.619 m)   Wt 228 lb (103.4 kg)   SpO2 97%   BMI 39.44 kg/m  Nursing note and vital signs reviewed.  Physical Exam  Constitutional: She is oriented to person, place, and time. She appears well-developed and well-nourished. No distress.  HENT:  Right Ear: Hearing, tympanic membrane, external ear and ear canal normal.  Left Ear: Hearing, tympanic membrane, external ear and ear canal normal.  Nose: Right  sinus exhibits maxillary sinus tenderness and frontal sinus tenderness. Left sinus exhibits maxillary sinus tenderness and frontal sinus tenderness.  Mouth/Throat: Uvula is midline, oropharynx is clear and moist and mucous membranes are normal.  Neck: Neck supple.  Cardiovascular: Normal rate, regular rhythm, normal heart sounds and intact distal pulses.   Pulmonary/Chest: Effort normal and breath sounds normal.  Lymphadenopathy:    She has no cervical adenopathy.  Neurological: She is alert and oriented to person, place, and time.  Skin: Skin is warm and dry.  Psychiatric: She has a normal mood and affect. Her behavior is normal. Judgment and thought content  normal.       Assessment & Plan:   Problem List Items Addressed This Visit      Respiratory   Acute non-recurrent pansinusitis - Primary    Symptoms and exam consistent with acute sinusitis most likely bacterial with no improvements. Start Augmentin. Start promethazine-codeine as needed for cough and sleep. Continue over-the-counter medications as needed for symptom relief and supportive care. Follow-up if symptoms worsen or do not improve.      Relevant Medications   promethazine-codeine (PHENERGAN WITH CODEINE) 6.25-10 MG/5ML syrup   amoxicillin-clavulanate (AUGMENTIN) 875-125 MG tablet       I have discontinued Ms. Donaho's zolpidem. I am also having her start on promethazine-codeine and amoxicillin-clavulanate. Additionally, I am having her maintain her vitamin E, CENTRUM SILVER, fish oil-omega-3 fatty acids, vitamin C, naproxen sodium, citalopram, atorvastatin, fenofibrate, triamterene-hydrochlorothiazide, labetalol, Diclofenac Sodium, and VIMOVO.   Meds ordered this encounter  Medications  . promethazine-codeine (PHENERGAN WITH CODEINE) 6.25-10 MG/5ML syrup    Sig: Take 5 mLs by mouth every 6 (six) hours as needed.    Dispense:  180 mL    Refill:  0    Order Specific Question:   Supervising Provider    Answer:   Pricilla Holm A [[id number]]  . amoxicillin-clavulanate (AUGMENTIN) 875-125 MG tablet    Sig: Take 1 tablet by mouth 2 (two) times daily.    Dispense:  14 tablet    Refill:  0    Order Specific Question:   Supervising Provider    Answer:   Pricilla Holm A [[id number]     Follow-up: Return if symptoms worsen or fail to improve.  Mauricio Po, FNP

## 2016-07-25 NOTE — Assessment & Plan Note (Signed)
Symptoms and exam consistent with acute sinusitis most likely bacterial with no improvements. Start Augmentin. Start promethazine-codeine as needed for cough and sleep. Continue over-the-counter medications as needed for symptom relief and supportive care. Follow-up if symptoms worsen or do not improve.

## 2016-08-13 ENCOUNTER — Other Ambulatory Visit: Payer: Self-pay | Admitting: Internal Medicine

## 2016-08-15 NOTE — Telephone Encounter (Signed)
Please advise, PCP is Dr. Quay Burow but no meds are perscribed under PCP

## 2016-08-22 DIAGNOSIS — E65 Localized adiposity: Secondary | ICD-10-CM | POA: Diagnosis not present

## 2016-09-28 DIAGNOSIS — L0291 Cutaneous abscess, unspecified: Secondary | ICD-10-CM | POA: Diagnosis not present

## 2016-10-21 DIAGNOSIS — L718 Other rosacea: Secondary | ICD-10-CM | POA: Diagnosis not present

## 2016-11-03 DIAGNOSIS — Z0279 Encounter for issue of other medical certificate: Secondary | ICD-10-CM

## 2016-11-17 ENCOUNTER — Other Ambulatory Visit: Payer: Self-pay | Admitting: Internal Medicine

## 2016-11-17 NOTE — Telephone Encounter (Signed)
Pt comes in tomorrow. Will fill at appt.

## 2016-11-17 NOTE — Progress Notes (Signed)
Subjective:    Patient ID: [name], female    DOB: 08-11-1965, 51 y.o.   MRN: [id number]  HPI The patient is here for follow up.  Hypertension: She is taking her medication daily. She is compliant with a low sodium diet.  She denies chest pain, palpitations, edema, shortness of breath and regular headaches. She is not currently exercising regularly.    Diabetes: She is controlling her sugars with diet. She is compliant with a diabetic diet. She is not exercising regularly. She checks her feet daily and denies foot lesions.    Hyperlipidemia: She is taking her medication daily. She is compliant with a low fat/cholesterol diet. She is not exercising regularly. She denies myalgias.   Insomnia:  Most of the time she is able to fall asleep ok.  She does not always stay asleep.  A few nights she was exhausted and only slept two hours.  She was on ambien in the past.  She has been doing melatonin and sometimes that helps, but not enough.  She doe snot feel stressed/ depressed.    Obesity: She is frustrated by her inability to lose weight. She has not been sleeping well and has been extremely tired. She has not been getting up and going to the gym because she is so tired. She is still eating very healthy. She plans on seeing a wellness doctor to help with weight loss.  Medications and allergies reviewed with patient and updated if appropriate.  Patient Active Problem List   Diagnosis Date Noted  . Lateral epicondylitis 05/23/2016  . Diabetes ([place]) 05/23/2016  . Snoring 09/07/2015  . Insomnia 06/02/2009  . Obesity 05/19/2008  . Chronic kidney disease 05/19/2008  . Hyperlipidemia 04/17/2008  . COMMON MIGRAINE 04/17/2008  . Essential hypertension 04/17/2008  . ALLERGIC RHINITIS 04/17/2008    Current Outpatient Prescriptions on File Prior to Visit  Medication Sig Dispense Refill  . Ascorbic Acid (VITAMIN C) 1000 MG tablet Take 1,000 mg by mouth daily.      Marland Kitchen atorvastatin  (LIPITOR) 40 MG tablet TAKE 1 TABLET BY MOUTH EVERY DAY 90 tablet 2  . citalopram (CELEXA) 10 MG tablet Take 1 tablet (10 mg total) by mouth daily. 90 tablet 2  . Diclofenac Sodium (PENNSAID) 2 % SOLN Place 1 application onto the skin 2 (two) times daily as needed. 112 g 1  . fenofibrate (TRICOR) 145 MG tablet TAKE 1 TABLET BY MOUTH EVERY DAY 90 tablet 2  . fish oil-omega-3 fatty acids 1000 MG capsule Take 1 g by mouth daily.      Marland Kitchen labetalol (NORMODYNE) 200 MG tablet TAKE 2 TABLETS (400 MG TOTAL) BY MOUTH 3 (THREE) TIMES DAILY. 540 tablet 2  . Multiple Vitamins-Minerals (CENTRUM SILVER) tablet Take 1 tablet by mouth daily.      . naproxen sodium (ANAPROX) 220 MG tablet Take 220 mg by mouth 2 (two) times daily with a meal.    . triamterene-hydrochlorothiazide (DYAZIDE) 37.5-25 MG capsule TAKE ONE CAPSULE BY MOUTH EVERY DAY 90 capsule 2  . VIMOVO 500-20 MG TBEC TAKE ONE TABLET BY MOUTH TWICE DAILY AS NEEDED 60 tablet 0  . vitamin E (VITAMIN E) 400 UNIT capsule Take 400 Units by mouth daily.       No current facility-administered medications on file prior to visit.     Past Medical History:  Diagnosis Date  . ALLERGIC RHINITIS   . COMMON MIGRAINE   . GLUCOSE INTOLERANCE   . Hypercalcemia   .  HYPERLIPIDEMIA   . HYPERTENSION   . INSOMNIA   . OBESITY   . RENAL INSUFFICIENCY     Past Surgical History:  Procedure Laterality Date  . BREAST SURGERY  1998   Implants  . hypertension post partum  09/2006   Not precalmpsia    Social History   Social History  . Marital status: Married    Spouse name: N/A  . Number of children: N/A  . Years of education: N/A   Social History Main Topics  . Smoking status: Former Smoker    Quit date: 05/09/2005  . Smokeless tobacco: Not on file     Comment: Married, lives with spouse and son. 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 Alcohol use Yes     Comment: social  . Drug use: No  . Sexual activity: Not on file    Other Topics Concern  . Not on file   Social History Narrative  . No narrative on file    Family History  Problem Relation Age of Onset  . Diabetes Maternal Grandfather   . Hypertension Mother   . Other Father        MVA    Review of Systems  Constitutional: Negative for fever.  Respiratory: Negative for cough, shortness of breath and wheezing.   Cardiovascular: Negative for chest pain, palpitations and leg swelling.  Neurological: Positive for headaches (More frequent with menopause, but not regular). Negative for light-headedness.  Psychiatric/Behavioral: Positive for sleep disturbance. Negative for dysphoric mood. The patient is not nervous/anxious.        Objective:   Vitals:   11/18/16 1552  BP: 128/86  Pulse: 91  Resp: 16  Temp: 98.1 F (36.7 C)   Wt Readings from Last 3 Encounters:  11/18/16 232 lb (105.2 kg)  07/25/16 228 lb (103.4 kg)  05/23/16 232 lb (105.2 kg)   Body mass index is 40.14 kg/m.   Physical Exam    Constitutional: Appears well-developed and well-nourished. No distress.  HENT:  Head: Normocephalic and atraumatic.  Neck: Neck supple. No tracheal deviation present. No thyromegaly present.  No cervical lymphadenopathy Cardiovascular: Normal rate, regular rhythm and normal heart sounds.   No murmur heard. No carotid bruit .  No edema Pulmonary/Chest: Effort normal and breath sounds normal. No respiratory distress. No has no wheezes. No rales.  Skin: Skin is warm and dry. Not diaphoretic.  Psychiatric: Normal mood and affect. Behavior is normal.      Assessment & Plan:    See Problem List for Assessment and Plan of chronic medical problems.

## 2016-11-17 NOTE — Telephone Encounter (Signed)
Will Dr.Burns take over these meds since she her PCP and Sharlet Salina is out

## 2016-11-18 ENCOUNTER — Ambulatory Visit (INDEPENDENT_AMBULATORY_CARE_PROVIDER_SITE_OTHER): Payer: BLUE CROSS/BLUE SHIELD | Admitting: Internal Medicine

## 2016-11-18 ENCOUNTER — Encounter: Payer: Self-pay | Admitting: Internal Medicine

## 2016-11-18 VITALS — BP 128/86 | HR 91 | Temp 98.1°F | Resp 16 | Ht 63.75 in | Wt 232.0 lb

## 2016-11-18 DIAGNOSIS — Z6841 Body Mass Index (BMI) 40.0 and over, adult: Secondary | ICD-10-CM | POA: Diagnosis not present

## 2016-11-18 DIAGNOSIS — I10 Essential (primary) hypertension: Secondary | ICD-10-CM

## 2016-11-18 DIAGNOSIS — G4709 Other insomnia: Secondary | ICD-10-CM

## 2016-11-18 DIAGNOSIS — E119 Type 2 diabetes mellitus without complications: Secondary | ICD-10-CM

## 2016-11-18 DIAGNOSIS — M7711 Lateral epicondylitis, right elbow: Secondary | ICD-10-CM | POA: Diagnosis not present

## 2016-11-18 DIAGNOSIS — E7800 Pure hypercholesterolemia, unspecified: Secondary | ICD-10-CM | POA: Diagnosis not present

## 2016-11-18 MED ORDER — ATORVASTATIN CALCIUM 40 MG PO TABS: 40.0000 mg | ORAL_TABLET | Freq: Every day | ORAL | 3 refills | Status: DC

## 2016-11-18 MED ORDER — TRAZODONE HCL 50 MG PO TABS
50.0000 mg | ORAL_TABLET | Freq: Every evening | ORAL | 5 refills | Status: DC | PRN
Start: 2016-11-18 — End: 2016-12-07

## 2016-11-18 MED ORDER — LABETALOL HCL 200 MG PO TABS: ORAL_TABLET | ORAL | 2 refills | Status: DC

## 2016-11-18 MED ORDER — FENOFIBRATE 145 MG PO TABS: 145.0000 mg | ORAL_TABLET | Freq: Every day | ORAL | 3 refills | Status: DC

## 2016-11-18 MED ORDER — TRIAMTERENE-HCTZ 37.5-25 MG PO CAPS: 1.0000 | ORAL_CAPSULE | Freq: Every day | ORAL | 3 refills | Status: DC

## 2016-11-18 NOTE — Assessment & Plan Note (Addendum)
Not sleeping-this has been an intermittent issue for her over the years Will try trazodone 50-100 mg at night If it is not effective will try gabapentin Was on ambien in the past and she would like to avoid

## 2016-11-18 NOTE — Assessment & Plan Note (Signed)
Had a steroid injection and it did not help (done at work) Has taken nsaids - it did help when on it

## 2016-11-18 NOTE — Assessment & Plan Note (Signed)
BP well controlled Current regimen effective and well tolerated Continue current medications at current doses cmp  

## 2016-11-18 NOTE — Patient Instructions (Addendum)
Menopause treatments: Estrogen therapy, progestin  Relaxation-paced respiration Exercise and weight loss SSRIs, SSRIs-Prozac, Lexapro, Effexor Gabapentin Clonidine transdermal  Acupuncture Black cohosh Evening primrose oil Soy products Phyto-estrogens-nonsteroidal components that occur naturally in plants, fruits and vegetables    Test(s) ordered today. Your results will be released to [place] (or called to you) after review, usually within 72hours after test completion. If any changes need to be made, you will be notified at that same time.   Medications reviewed and updated.  Changes include starting trazodone for your sleep.   Your prescription(s) have been submitted to your pharmacy. Please take as directed and contact our office if you believe you are having problem(s) with the medication(s).    Please followup in  6 months

## 2016-11-18 NOTE — Assessment & Plan Note (Signed)
Has eaten so we will not check cholesterol Continue statin and fenofibrate Increase exercise and work on weight loss Goal is to decrease at least the fenofibrate

## 2016-11-18 NOTE — Assessment & Plan Note (Signed)
Diet controlled Has been controlled Check A1c today Discussed possibly starting trulicity or a similar medication to help with weight loss-she deferred for now and wants to try to lose weight naturally Increase exercise Diabetic diet Work on weight loss Follow-up in 6 months

## 2016-11-18 NOTE — Assessment & Plan Note (Signed)
Will start seeing a wellness doctor and see if she can lose weight more naturally.  Discussed metabolic clinic - she can think about it Start regular exercise Goal is 50 lbs by the end of the year F/u in 6 months

## 2016-11-23 DIAGNOSIS — J3089 Other allergic rhinitis: Secondary | ICD-10-CM | POA: Diagnosis not present

## 2016-12-07 ENCOUNTER — Other Ambulatory Visit: Payer: Self-pay | Admitting: Internal Medicine

## 2016-12-07 DIAGNOSIS — G4709 Other insomnia: Secondary | ICD-10-CM

## 2016-12-07 MED ORDER — GABAPENTIN 100 MG PO CAPS: ORAL_CAPSULE | ORAL | 3 refills | Status: DC

## 2017-01-30 DIAGNOSIS — Z6838 Body mass index (BMI) 38.0-38.9, adult: Secondary | ICD-10-CM | POA: Diagnosis not present

## 2017-01-30 DIAGNOSIS — N952 Postmenopausal atrophic vaginitis: Secondary | ICD-10-CM | POA: Insufficient documentation

## 2017-01-30 DIAGNOSIS — N898 Other specified noninflammatory disorders of vagina: Secondary | ICD-10-CM | POA: Diagnosis not present

## 2017-01-30 DIAGNOSIS — Z130 Encounter for screening for diseases of the blood and blood-forming organs and certain disorders involving the immune mechanism: Secondary | ICD-10-CM | POA: Diagnosis not present

## 2017-01-30 DIAGNOSIS — Z124 Encounter for screening for malignant neoplasm of cervix: Secondary | ICD-10-CM | POA: Diagnosis not present

## 2017-01-30 DIAGNOSIS — Z1151 Encounter for screening for human papillomavirus (HPV): Secondary | ICD-10-CM | POA: Diagnosis not present

## 2017-01-30 DIAGNOSIS — Z01419 Encounter for gynecological examination (general) (routine) without abnormal findings: Secondary | ICD-10-CM | POA: Diagnosis not present

## 2017-01-30 DIAGNOSIS — Z1389 Encounter for screening for other disorder: Secondary | ICD-10-CM | POA: Diagnosis not present

## 2017-01-30 DIAGNOSIS — Z1231 Encounter for screening mammogram for malignant neoplasm of breast: Secondary | ICD-10-CM | POA: Diagnosis not present

## 2017-01-31 DIAGNOSIS — Z124 Encounter for screening for malignant neoplasm of cervix: Secondary | ICD-10-CM | POA: Diagnosis not present

## 2017-02-01 DIAGNOSIS — R238 Other skin changes: Secondary | ICD-10-CM | POA: Diagnosis not present

## 2017-02-20 DIAGNOSIS — J069 Acute upper respiratory infection, unspecified: Secondary | ICD-10-CM | POA: Diagnosis not present

## 2017-02-20 DIAGNOSIS — E785 Hyperlipidemia, unspecified: Secondary | ICD-10-CM | POA: Diagnosis not present

## 2017-02-20 DIAGNOSIS — J3089 Other allergic rhinitis: Secondary | ICD-10-CM | POA: Diagnosis not present

## 2017-05-24 ENCOUNTER — Ambulatory Visit: Payer: BLUE CROSS/BLUE SHIELD | Admitting: Internal Medicine

## 2017-05-24 ENCOUNTER — Encounter: Payer: Self-pay | Admitting: Internal Medicine

## 2017-05-24 VITALS — BP 102/78 | HR 88 | Temp 98.7°F | Resp 16 | Wt 211.0 lb

## 2017-05-24 DIAGNOSIS — E7849 Other hyperlipidemia: Secondary | ICD-10-CM

## 2017-05-24 DIAGNOSIS — E119 Type 2 diabetes mellitus without complications: Secondary | ICD-10-CM

## 2017-05-24 DIAGNOSIS — I10 Essential (primary) hypertension: Secondary | ICD-10-CM

## 2017-05-24 DIAGNOSIS — Z1211 Encounter for screening for malignant neoplasm of colon: Secondary | ICD-10-CM | POA: Diagnosis not present

## 2017-05-24 DIAGNOSIS — F32A Depression, unspecified: Secondary | ICD-10-CM | POA: Insufficient documentation

## 2017-05-24 DIAGNOSIS — F4323 Adjustment disorder with mixed anxiety and depressed mood: Secondary | ICD-10-CM | POA: Diagnosis not present

## 2017-05-24 DIAGNOSIS — F419 Anxiety disorder, unspecified: Secondary | ICD-10-CM | POA: Insufficient documentation

## 2017-05-24 MED ORDER — TRIAMTERENE-HCTZ 37.5-25 MG PO CAPS: 1.0000 | ORAL_CAPSULE | Freq: Every day | ORAL | 3 refills | Status: DC

## 2017-05-24 MED ORDER — FENOFIBRATE 145 MG PO TABS: 145.0000 mg | ORAL_TABLET | Freq: Every day | ORAL | 3 refills | Status: DC

## 2017-05-24 MED ORDER — SERTRALINE HCL 50 MG PO TABS: 50.0000 mg | ORAL_TABLET | Freq: Every day | ORAL | 5 refills | Status: DC

## 2017-05-24 MED ORDER — LABETALOL HCL 200 MG PO TABS: ORAL_TABLET | ORAL | 2 refills | Status: DC

## 2017-05-24 MED ORDER — ATORVASTATIN CALCIUM 40 MG PO TABS: 40.0000 mg | ORAL_TABLET | Freq: Every day | ORAL | 3 refills | Status: DC

## 2017-05-24 NOTE — Assessment & Plan Note (Signed)
We discussed options and both feel she would benefit from taking medication Will start sertraline 50 mg daily - she will let me know if she has any side effects of if she feels it needs to be increased in 4 weeks

## 2017-05-24 NOTE — Assessment & Plan Note (Signed)
Check lipid panel  Continue daily statin Regular exercise and healthy diet encouraged  

## 2017-05-24 NOTE — Assessment & Plan Note (Signed)
Diet controlled Check a1c Low sugar / carb diet Stressed regular exercise, weight loss  

## 2017-05-24 NOTE — Progress Notes (Signed)
Subjective:    Patient ID: [name], female    DOB: 1965-10-30, 52 y.o.   MRN: [id number]  HPI The patient is here for follow up.  Hypertension: She is taking her medication daily. She is compliant with a low sodium diet.  She denies chest pain, palpitations, regular edema, shortness of breath and regular headaches. She is exercising regularly - walking.       Hyperlipidemia: She is taking her medication daily. She is compliant with a low fat/cholesterol diet. She is exercising regularly - walking. She denies myalgias.   Diabetes: She is controlling her sugars with diet. She is compliant with a diabetic diet. She is exercising regularly - walking.  She is working on weight loss. She is up-to-date with an ophthalmology examination.   Going through menopause, some depression and anxiety:  She is caring for her mom who was diagnosed with Alzheimer's and she will be moving in soon, she has a 69 year old son, has a family friend with special needs that she is helping to care for and has many other stresses.  She feels irritated, gets snippy with her husband, she feels restless at times, she worries and she feels some depression.  She is wondering about medication.   Medications and allergies reviewed with patient and updated if appropriate.  Patient Active Problem List   Diagnosis Date Noted  . Lateral epicondylitis 05/23/2016  . Diabetes ([place]) 05/23/2016  . Snoring 09/07/2015  . Insomnia 06/02/2009  . Obesity 05/19/2008  . Chronic kidney disease 05/19/2008  . Hyperlipidemia 04/17/2008  . COMMON MIGRAINE 04/17/2008  . Essential hypertension 04/17/2008  . ALLERGIC RHINITIS 04/17/2008    Current Outpatient Medications on File Prior to Visit  Medication Sig Dispense Refill  . Ascorbic Acid (VITAMIN C) 1000 MG tablet Take 1,000 mg by mouth daily.      Marland Kitchen atorvastatin (LIPITOR) 40 MG tablet Take 1 tablet (40 mg total) by mouth daily. 90 tablet 3  . Diclofenac Sodium (PENNSAID) 2  % SOLN Place 1 application onto the skin 2 (two) times daily as needed. 112 g 1  . fenofibrate (TRICOR) 145 MG tablet Take 1 tablet (145 mg total) by mouth daily. 90 tablet 3  . fish oil-omega-3 fatty acids 1000 MG capsule Take 1 g by mouth daily.      . fluticasone (FLONASE) 50 MCG/ACT nasal spray Place 2 sprays into both nostrils daily.    Marland Kitchen gabapentin (NEURONTIN) 100 MG capsule Take 2 caps at night, increase to three caps at night if tolerated 90 capsule 3  . labetalol (NORMODYNE) 200 MG tablet TAKE 2 TABLETS (400 MG TOTAL) BY MOUTH 3 (THREE) TIMES DAILY. 540 tablet 2  . Multiple Vitamins-Minerals (CENTRUM SILVER) tablet Take 1 tablet by mouth daily.      . naproxen sodium (ANAPROX) 220 MG tablet Take 220 mg by mouth 2 (two) times daily with a meal.    . triamterene-hydrochlorothiazide (DYAZIDE) 37.5-25 MG capsule Take 1 each (1 capsule total) by mouth daily. 90 capsule 3  . vitamin E (VITAMIN E) 400 UNIT capsule Take 400 Units by mouth daily.       No current facility-administered medications on file prior to visit.     Past Medical History:  Diagnosis Date  . ALLERGIC RHINITIS   . COMMON MIGRAINE   . GLUCOSE INTOLERANCE   . Hypercalcemia   . HYPERLIPIDEMIA   . HYPERTENSION   . INSOMNIA   . OBESITY   . RENAL INSUFFICIENCY  Past Surgical History:  Procedure Laterality Date  . BREAST SURGERY  1998   Implants  . hypertension post partum  09/2006   Not precalmpsia    Social History   Socioeconomic History  . Marital status: Married    Spouse name: Not on file  . Number of children: Not on file  . Years of education: Not on file  . Highest education level: Not on file  Social Needs  . Financial resource strain: Not on file  . Food insecurity - worry: Not on file  . Food insecurity - inability: Not on file  . Transportation needs - medical: Not on file  . Transportation needs - non-medical: Not on file  Occupational History  . Not on file  Tobacco Use  . Smoking  status: Former Smoker    Last attempt to quit: 05/09/2005    Years since quitting: 12.0  . Smokeless tobacco: Never Used  . Tobacco comment: Married, lives with spouse and son. 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 Alcohol use: Yes    Comment: social  . Drug use: No  . Sexual activity: Not on file  Other Topics Concern  . Not on file  Social History Narrative  . Not on file    Family History  Problem Relation Age of Onset  . Diabetes Maternal Grandfather   . Hypertension Mother   . Other Father        MVA    Review of Systems  Constitutional: Negative for chills and fever.  Respiratory: Negative for cough, shortness of breath and wheezing.   Cardiovascular: Positive for leg swelling (at times). Negative for chest pain and palpitations.  Neurological: Positive for headaches (intermittent). Negative for dizziness.  Psychiatric/Behavioral: Positive for dysphoric mood. The patient is nervous/anxious.        Objective:   Vitals:   05/24/17 1540  BP: 102/78  Pulse: 88  Resp: 16  Temp: 98.7 F (37.1 C)  SpO2: 98%   Wt Readings from Last 3 Encounters:  05/24/17 211 lb (95.7 kg)  11/18/16 232 lb (105.2 kg)  07/25/16 228 lb (103.4 kg)   Body mass index is 36.5 kg/m.   Physical Exam    Constitutional: Appears well-developed and well-nourished. No distress.  HENT:  Head: Normocephalic and atraumatic.  Neck: Neck supple. No tracheal deviation present. No thyromegaly present.  No cervical lymphadenopathy Cardiovascular: Normal rate, regular rhythm and normal heart sounds.   No murmur heard. No carotid bruit .  No edema Pulmonary/Chest: Effort normal and breath sounds normal. No respiratory distress. No has no wheezes. No rales.  Skin: Skin is warm and dry. Not diaphoretic.  Psychiatric: anxious mood and affect. Behavior is normal.      Assessment & Plan:    See Problem List for Assessment and Plan of  chronic medical problems.

## 2017-05-24 NOTE — Patient Instructions (Addendum)
  Test(s) ordered today. Your results will be released to [place] (or called to you) after review, usually within 72hours after test completion. If any changes need to be made, you will be notified at that same time.  All other Health Maintenance issues reviewed.   All recommended immunizations and age-appropriate screenings are up-to-date or discussed.  No immunizations administered today.   Medications reviewed and updated.  Changes include starting sertraline 50 mg nightly.   Your prescription(s) have been submitted to your pharmacy. Please take as directed and contact our office if you believe you are having problem(s) with the medication(s).   Please followup in 6 months

## 2017-05-24 NOTE — Assessment & Plan Note (Addendum)
BP well controlled Current regimen effective and well tolerated Continue current medications at current doses cmp  

## 2017-05-30 ENCOUNTER — Encounter: Payer: Self-pay | Admitting: Physician Assistant

## 2017-06-09 DIAGNOSIS — J3089 Other allergic rhinitis: Secondary | ICD-10-CM | POA: Diagnosis not present

## 2017-06-09 DIAGNOSIS — E785 Hyperlipidemia, unspecified: Secondary | ICD-10-CM | POA: Diagnosis not present

## 2017-06-12 ENCOUNTER — Encounter: Payer: Self-pay | Admitting: *Deleted

## 2017-06-14 ENCOUNTER — Ambulatory Visit: Payer: BLUE CROSS/BLUE SHIELD | Admitting: Physician Assistant

## 2017-06-27 ENCOUNTER — Encounter: Payer: Self-pay | Admitting: Physician Assistant

## 2017-06-27 ENCOUNTER — Ambulatory Visit: Payer: BLUE CROSS/BLUE SHIELD | Admitting: Physician Assistant

## 2017-06-27 VITALS — BP 112/68 | HR 76 | Ht 63.0 in | Wt 207.2 lb

## 2017-06-27 DIAGNOSIS — K644 Residual hemorrhoidal skin tags: Secondary | ICD-10-CM

## 2017-06-27 DIAGNOSIS — Z1211 Encounter for screening for malignant neoplasm of colon: Secondary | ICD-10-CM | POA: Diagnosis not present

## 2017-06-27 NOTE — Progress Notes (Addendum)
Subjective:    Patient ID: [name], female    DOB: Sep 12, 1965, 52 y.o.   MRN: [id number]  HPI [name] is a pleasant 52 year old white female, new to GI today referred by Dr. Billey Hebert for colon cancer screening. Patient has no prior GI history. She requested office visit prior to her procedure because she has concerns about which she feels is a anal skin tag that she would like removed at the time of colonoscopy. Patient is generally in good health with history of hypertension, hyperlipidemia. Family history is negative for colon cancer and polyps. Patient has no current complaints of abdominal pain, no recent changes in bowel habits no melena or hematochezia. She says she has had the "skin tag" for many years but that it has gotten larger over time and is annoying. She says sometimes it gets irritated but she generally doesn't have any discomfort and has no bleeding.  Review of Systems Pertinent positive and negative review of systems were noted in the above HPI section.  All other review of systems was otherwise negative.  Outpatient Encounter Medications as of 06/27/2017  Medication Sig  . atorvastatin (LIPITOR) 40 MG tablet Take 1 tablet (40 mg total) by mouth daily.  Marland Kitchen DOXYCYCLINE MONOHYDRATE PO Take 50 mg by mouth 2 (two) times daily.  . fenofibrate (TRICOR) 145 MG tablet Take 1 tablet (145 mg total) by mouth daily.  Marland Kitchen labetalol (NORMODYNE) 200 MG tablet TAKE 2 TABLETS (400 MG TOTAL) BY MOUTH 3 (THREE) TIMES DAILY.  Marland Kitchen sertraline (ZOLOFT) 50 MG tablet Take 1 tablet (50 mg total) by mouth at bedtime.  . triamterene-hydrochlorothiazide (DYAZIDE) 37.5-25 MG capsule Take 1 each (1 capsule total) by mouth daily.    Allergies  Allergen Reactions  . Lisinopril     REACTION: depressed mood  . Olmesartan Medoxomil     REACTION: Headaches, dizziness   Patient Active Problem List   Diagnosis Date Noted  . Adjustment reaction with anxiety and depression 05/24/2017  . Lateral  epicondylitis 05/23/2016  . Diabetes ([place]) 05/23/2016  . Snoring 09/07/2015  . Insomnia 06/02/2009  . Obesity 05/19/2008  . Chronic kidney disease 05/19/2008  . Hyperlipidemia 04/17/2008  . COMMON MIGRAINE 04/17/2008  . Essential hypertension 04/17/2008  . ALLERGIC RHINITIS 04/17/2008   Social History   Socioeconomic History  . Marital status: Married                                              Tobacco Use  . Smoking status: Former Smoker    Last attempt to quit: 05/09/2005    Years since quitting: 12.1  . Smokeless tobacco: Never Used  . Tobacco comment: Married, lives with spouse and son. 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 Alcohol use: Yes    Comment: social  . Drug use: No   [name]'s family history includes Diabetes in her maternal grandfather; Hypertension in her mother; Other in her father.      Objective:    Vitals:   06/27/17 0833  BP: 112/68  Pulse: 76    Physical Exam ; developed white female in no acute distress, pleasant blood pressure 112/68 pulse 76, height 5 foot 3, weight 207, BMI 36.7. HEENT; nontraumatic normocephalic EOMI PERRLA sclera anicteric, Cardiovascular; and rhythm with S1-S2 no murmur rub or gallop,  Pulmonary ;clear bilaterally, Abdomen; soft, nontender nondistended bowel sounds are active there is no palpable mass or hepatosplenomegaly, Rectal ;exam, she has a long external hemorrhoidal tag, noninflamed nontender, Extremities; no clubbing cyanosis or edema skin warm and dry, Neuropsych ;mood and affect appropriate       Assessment & Plan:   #82 52 year old white female referred for colon cancer screening, asymptomatic and of average risk. #2 external hemorrhoidal tag/elongated #3 hypertension #4 hyperlipidemia  Plan; patient will be scheduled for colonoscopy with Dr. Carlean Hebert. Procedure was discussed in detail with the patient including indications risks and  benefits and she is agreeable to proceed. We discussed that the external hemorrhoidal tag would not be able to be removed at the time of colonoscopy. Explained that this is not of any medical concern ,and does not have to be removed, however if she would like to have removal we will be happy to refer her to Dr. Elmo Putt Hebert/[facility], after colonoscopy.   Victoria S Esterwood PA-C 06/27/2017   Cc: Victoria Rail, MD  Agree with Ms. [name] assessment and plan. Victoria Mayer, MD, [name]

## 2017-06-27 NOTE — Patient Instructions (Signed)

## 2017-08-04 DIAGNOSIS — J3089 Other allergic rhinitis: Secondary | ICD-10-CM | POA: Diagnosis not present

## 2017-08-30 ENCOUNTER — Encounter: Payer: Self-pay | Admitting: Internal Medicine

## 2017-09-13 ENCOUNTER — Ambulatory Visit (AMBULATORY_SURGERY_CENTER): Payer: BLUE CROSS/BLUE SHIELD | Admitting: Internal Medicine

## 2017-09-13 ENCOUNTER — Other Ambulatory Visit: Payer: Self-pay

## 2017-09-13 ENCOUNTER — Telehealth: Payer: Self-pay

## 2017-09-13 ENCOUNTER — Encounter: Payer: Self-pay | Admitting: Internal Medicine

## 2017-09-13 VITALS — BP 133/88 | HR 69 | Temp 98.6°F | Resp 16 | Ht 63.0 in | Wt 203.0 lb

## 2017-09-13 DIAGNOSIS — D129 Benign neoplasm of anus and anal canal: Secondary | ICD-10-CM

## 2017-09-13 DIAGNOSIS — Z1211 Encounter for screening for malignant neoplasm of colon: Secondary | ICD-10-CM

## 2017-09-13 DIAGNOSIS — K644 Residual hemorrhoidal skin tags: Secondary | ICD-10-CM

## 2017-09-13 DIAGNOSIS — D122 Benign neoplasm of ascending colon: Secondary | ICD-10-CM | POA: Diagnosis not present

## 2017-09-13 DIAGNOSIS — D128 Benign neoplasm of rectum: Secondary | ICD-10-CM

## 2017-09-13 DIAGNOSIS — K621 Rectal polyp: Secondary | ICD-10-CM

## 2017-09-13 HISTORY — DX: Residual hemorrhoidal skin tags: K64.4

## 2017-09-13 MED ORDER — SODIUM CHLORIDE 0.9 % IV SOLN: 500.0000 mL | Freq: Once | INTRAVENOUS | Status: DC

## 2017-09-13 NOTE — Op Note (Signed)
[facility] Patient Name: [name] Procedure Date: 09/13/2017 8:34 AM MRN: [id number] Endoscopist: Gatha Mayer , MD Age: 52 Referring MD:  Date of Birth: [birth year] Gender: Female Account #: [id number] Procedure:                Colonoscopy Indications:              Screening for colorectal malignant neoplasm Medicines:                Propofol per Anesthesia, Monitored Anesthesia Care Procedure:                Pre-Anesthesia Assessment:                           - Prior to the procedure, a History and Physical                            was performed, and patient medications and                            allergies were reviewed. The patient's tolerance of                            previous anesthesia was also reviewed. The risks                            and benefits of the procedure and the sedation                            options and risks were discussed with the patient.                            All questions were answered, and informed consent                            was obtained. Prior Anticoagulants: The patient has                            taken no previous anticoagulant or antiplatelet                            agents. ASA Grade Assessment: II - A patient with                            mild systemic disease. After reviewing the risks                            and benefits, the patient was deemed in                            satisfactory condition to undergo the procedure.                           After obtaining informed consent, the colonoscope  was passed under direct vision. Throughout the                            procedure, the patient's blood pressure, pulse, and                            oxygen saturations were monitored continuously. The                            Colonoscope was introduced through the anus and                            advanced to the the cecum, identified by   appendiceal orifice and ileocecal valve. The                            colonoscopy was performed without difficulty. The                            patient tolerated the procedure well. The quality                            of the bowel preparation was excellent. The                            ileocecal valve, appendiceal orifice, and rectum                            were photographed. The bowel preparation used was                            Miralax. Scope In: 8:47:29 AM Scope Out: 9:06:21 AM Scope Withdrawal Time: 0 hours 13 minutes 30 seconds  Total Procedure Duration: 0 hours 18 minutes 52 seconds  Findings:                 The perianal exam findings include a skin tag.                           The digital rectal exam was normal.                           A 4 mm polyp was found in the ascending colon. The                            polyp was sessile. The polyp was removed with a                            cold snare. Resection and retrieval were complete.                            Verification of patient identification for the                            specimen was  done.                           A 1 to 2 mm polyp was found in the rectum. The                            polyp was sessile. The polyp was removed with a                            cold biopsy forceps. Resection and retrieval were                            complete. Verification of patient identification                            for the specimen was done. Estimated blood loss was                            minimal.                           A few diverticula were found in the sigmoid colon.                           The exam was otherwise without abnormality on                            direct and retroflexion views. Complications:            No immediate complications. Estimated Blood Loss:     Estimated blood loss was minimal. Impression:               - Perianal skin tag found on perianal exam.                            - One 4 mm polyp in the ascending colon, removed                            with a cold snare. Resected and retrieved.                           - One 1 to 2 mm polyp in the rectum, removed with a                            cold biopsy forceps. Resected and retrieved.                           - Diverticulosis in the sigmoid colon.                           - The examination was otherwise normal on direct                            and retroflexion views. Recommendation:           -  Patient has a contact number available for                            emergencies. The signs and symptoms of potential                            delayed complications were discussed with the                            patient. Return to normal activities tomorrow.                            Written discharge instructions were provided to the                            patient.                           - Resume previous diet.                           - Continue present medications.                           - Repeat colonoscopy is recommended. The                            colonoscopy date will be determined after pathology                            results from today's exam become available for                            review.                           - I will ask her about referral to remove very                            large anal skin tag. I think it makes sense - ? if                            can be done under local. Gatha Mayer, MD 09/13/2017 9:18:58 AM This report has been signed electronically.

## 2017-09-13 NOTE — Patient Instructions (Addendum)
I found and removed 2 tiny polyps.  You also have diverticulosis - thickened muscle rings and pouches in the colon wall. Please read the handout about this condition.  I think its very reasonable to have the skin tag removed and am happy to arrange a surgery appointment.  I will let you know pathology results and when to have another routine colonoscopy by mail and/or My Chart.  I appreciate the opportunity to care for you. Gatha Mayer, MD, FACG  YOU HAD AN ENDOSCOPIC PROCEDURE TODAY AT [facility]:   Refer to the procedure report that was given to you for any specific questions about what was found during the examination.  If the procedure report does not answer your questions, please call your gastroenterologist to clarify.  If you requested that your care partner not be given the details of your procedure findings, then the procedure report has been included in a sealed envelope for you to review at your convenience later.  YOU SHOULD EXPECT: Some feelings of bloating in the abdomen. Passage of more gas than usual.  Walking can help get rid of the air that was put into your GI tract during the procedure and reduce the bloating. If you had a lower endoscopy (such as a colonoscopy or flexible sigmoidoscopy) you may notice spotting of blood in your stool or on the toilet paper. If you underwent a bowel prep for your procedure, you may not have a normal bowel movement for a few days.  Please Note:  You might notice some irritation and congestion in your nose or some drainage.  This is from the oxygen used during your procedure.  There is no need for concern and it should clear up in a day or so.  SYMPTOMS TO REPORT IMMEDIATELY:   Following lower endoscopy (colonoscopy or flexible sigmoidoscopy):  Excessive amounts of blood in the stool  Significant tenderness or worsening of abdominal pains  Swelling of the abdomen that is new, acute  Fever of 100F or  higher  For urgent or emergent issues, a gastroenterologist can be reached at any hour by calling [phone].   DIET:  We do recommend a small meal at first, but then you may proceed to your regular diet.  Drink plenty of fluids but you should avoid alcoholic beverages for 24 hours.  ACTIVITY:  You should plan to take it easy for the rest of today and you should NOT DRIVE or use heavy machinery until tomorrow (because of the sedation medicines used during the test).    FOLLOW UP: Our staff will call the number listed on your records the next business day following your procedure to check on you and address any questions or concerns that you may have regarding the information given to you following your procedure. If we do not reach you, we will leave a message.  However, if you are feeling well and you are not experiencing any problems, there is no need to return our call.  We will assume that you have returned to your regular daily activities without incident.  If any biopsies were taken you will be contacted by phone or by letter within the next 1-3 weeks.  Please call us at [phone] if you have not heard about the biopsies in 3 weeks.    SIGNATURES/CONFIDENTIALITY: You and/or your care partner have signed paperwork which will be entered into your electronic medical record.  These signatures attest to the fact that that the information  above on your After Visit Summary has been reviewed and is understood.  Full responsibility of the confidentiality of this discharge information lies with you and/or your care-partner. 

## 2017-09-13 NOTE — Telephone Encounter (Signed)
Referral faxed to [facility] 

## 2017-09-13 NOTE — Progress Notes (Signed)
Called to room to assist during endoscopic procedure.  Patient ID and intended procedure confirmed with present staff. Received instructions for my participation in the procedure from the performing physician.  

## 2017-09-13 NOTE — Progress Notes (Signed)
Report given to PACU, vss 

## 2017-09-13 NOTE — Telephone Encounter (Signed)
-----   Message from Gatha Mayer, MD sent at 09/13/2017  9:28 AM EDT ----- Regarding: [facility] appt Please refer her to Dr. Leighton Ruff re: large anal skin tag

## 2017-09-14 ENCOUNTER — Telehealth: Payer: Self-pay | Admitting: *Deleted

## 2017-09-14 ENCOUNTER — Telehealth: Payer: Self-pay

## 2017-09-14 NOTE — Telephone Encounter (Signed)
Attempted to reach pt. With follow-up call following endoscopic procedure 09/13/2017.  LM on pt. Ans. Machine.  Will try to reach pt. Again later today.

## 2017-09-14 NOTE — Telephone Encounter (Signed)
  Follow up Call-  Call back number 09/13/2017  Post procedure Call Back phone  # [phone]  Permission to leave phone message Yes  Some recent data might be hidden     Patient questions:  Do you have a fever, pain , or abdominal swelling? No. Pain Score  0 *  Have you tolerated food without any problems? Yes.    Have you been able to return to your normal activities? Yes.    Do you have any questions about your discharge instructions: Diet   No. Medications  No. Follow up visit  No.  Do you have questions or concerns about your Care? No.  Actions: * If pain score is 4 or above: No action needed, pain <4.

## 2017-09-16 ENCOUNTER — Other Ambulatory Visit: Payer: Self-pay | Admitting: Internal Medicine

## 2017-09-20 ENCOUNTER — Encounter: Payer: Self-pay | Admitting: Internal Medicine

## 2017-09-20 DIAGNOSIS — Z860100 Personal history of colon polyps, unspecified: Secondary | ICD-10-CM

## 2017-09-20 HISTORY — DX: Personal history of colonic polyps: Z86.010

## 2017-09-20 HISTORY — DX: Personal history of colon polyps, unspecified: Z86.0100

## 2017-09-20 NOTE — Progress Notes (Signed)
1 ssp, 1 distal hyperplastic polyp Recall 2024

## 2017-10-31 ENCOUNTER — Ambulatory Visit: Payer: Self-pay | Admitting: General Surgery

## 2017-10-31 DIAGNOSIS — K644 Residual hemorrhoidal skin tags: Secondary | ICD-10-CM | POA: Diagnosis not present

## 2017-10-31 NOTE — H&P (Signed)
History of Present Illness Victoria Ruff MD; 3/38/2505 10:44 AM) The patient is a 52 year old female who presents with a complaint of anal problems. 52 year old female who presents to the office with complaints of a perianal skin tag. This is been present for many years and has been enlarging over the past few years. She reports chronic irritation and occasional blood on the toilet paper. She recently had a colonoscopy which showed 1 polyp.   Past Surgical History Mammie Lorenzo, LPN; 3/97/6734 19:37 AM) Breast Augmentation Bilateral. Cesarean Section - 1 Colon Polyp Removal - Colonoscopy  Diagnostic Studies History Mammie Lorenzo, LPN; 01/09/4096 35:32 AM) Colonoscopy within last year Mammogram within last year Pap Smear 1-5 years ago  Allergies Mammie Lorenzo, LPN; 9/92/4268 34:19 AM) No Known Allergies [10/31/2017]:  Medication History Mammie Lorenzo, LPN; 10/28/2977 89:21 AM) Atorvastatin Calcium (40MG  Tablet, Oral) Active. Doxycycline Monohydrate (50MG  Tablet, Oral as needed) Active. Fenofibrate (145MG  Tablet, Oral) Active. Labetalol HCl (200MG  Tablet, Oral) Active. Sertraline HCl (50MG  Tablet, Oral) Active. Triamterene-HCTZ (37.5-25MG  Capsule, Oral) Active. Medications Reconciled  Social History Mammie Lorenzo, LPN; 1/94/1740 81:44 AM) Alcohol use Occasional alcohol use. Caffeine use Carbonated beverages, Coffee. No drug use Tobacco use Former smoker.  Family History Mammie Lorenzo, LPN; 12/24/5629 49:70 AM) Alcohol Abuse Brother, Father, Mother. Anesthetic complications Mother. Arthritis Family Members In General. Cerebrovascular Accident Mother. Hypertension Brother, Mother. Ischemic Bowel Disease Mother. Thyroid problems Mother.  Pregnancy / Birth History Mammie Lorenzo, LPN; 2/63/7858 85:02 AM) Age at menarche 22 years. Age of menopause 46-50 Gravida 4 Irregular periods Maternal age 30-40 Para 1  Other Problems Mammie Lorenzo, LPN; 7/74/1287 86:76 AM) Arthritis High blood pressure Hypercholesterolemia Migraine Headache     Review of Systems Claiborne Billings Dockery LPN; 11/25/9468 96:28 AM) General Not Present- Appetite Loss, Chills, Fatigue, Fever, Night Sweats, Weight Gain and Weight Loss. Skin Not Present- Change in Wart/Mole, Dryness, Hives, Jaundice, New Lesions, Non-Healing Wounds, Rash and Ulcer. HEENT Present- Seasonal Allergies. Not Present- Earache, Hearing Loss, Hoarseness, Nose Bleed, Oral Ulcers, Ringing in the Ears, Sinus Pain, Sore Throat, Visual Disturbances, Wears glasses/contact lenses and Yellow Eyes. Respiratory Not Present- Bloody sputum, Chronic Cough, Difficulty Breathing, Snoring and Wheezing. Breast Not Present- Breast Mass, Breast Pain, Nipple Discharge and Skin Changes. Cardiovascular Not Present- Chest Pain, Difficulty Breathing Lying Down, Leg Cramps, Palpitations, Rapid Heart Rate, Shortness of Breath and Swelling of Extremities. Gastrointestinal Not Present- Abdominal Pain, Bloating, Bloody Stool, Change in Bowel Habits, Chronic diarrhea, Constipation, Difficulty Swallowing, Excessive gas, Gets full quickly at meals, Hemorrhoids, Indigestion, Nausea, Rectal Pain and Vomiting. Female Genitourinary Not Present- Frequency, Nocturia, Painful Urination, Pelvic Pain and Urgency. Musculoskeletal Not Present- Back Pain, Joint Pain, Joint Stiffness, Muscle Pain, Muscle Weakness and Swelling of Extremities. Neurological Not Present- Decreased Memory, Fainting, Headaches, Numbness, Seizures, Tingling, Tremor, Trouble walking and Weakness. Psychiatric Not Present- Anxiety, Bipolar, Change in Sleep Pattern, Depression, Fearful and Frequent crying. Endocrine Not Present- Cold Intolerance, Excessive Hunger, Hair Changes, Heat Intolerance, Hot flashes and New Diabetes. Hematology Not Present- Blood Thinners, Easy Bruising, Excessive bleeding, Gland problems, HIV and Persistent Infections.  Vitals  Claiborne Billings Dockery LPN; 3/66/2947 65:46 AM) 10/31/2017 10:41 AM Weight: 206.4 lb Height: 63in Body Surface Area: 1.96 m Body Mass Index: 36.56 kg/m  Temp.: 97.58F(Temporal)  Pulse: 86 (Regular)  BP: 132/88 (Sitting, Left Arm, Standard)      Physical Exam Victoria Ruff MD; 09/09/5463 10:50 AM)  General Mental Status-Alert. General Appearance-Not in acute distress. Build & Nutrition-Well nourished. Posture-Normal posture. Gait-Normal.  Head and Neck Head-normocephalic, atraumatic with no lesions or palpable masses. Trachea-midline.  Chest and Lung Exam Chest and lung exam reveals -on auscultation, normal breath sounds, no adventitious sounds and normal vocal resonance.  Cardiovascular Cardiovascular examination reveals -normal heart sounds, regular rate and rhythm with no murmurs and no digital clubbing, cyanosis, edema, increased warmth or tenderness.  Abdomen Inspection Inspection of the abdomen reveals - No Hernias. Palpation/Percussion Palpation and Percussion of the abdomen reveal - Soft, Non Tender, No Rigidity (guarding), No hepatosplenomegaly and No Palpable abdominal masses.  Rectal Anorectal Exam External - skin tag(anterior, large, non-inflamed).  Neurologic Neurologic evaluation reveals -alert and oriented x 3 with no impairment of recent or remote memory, normal attention span and ability to concentrate, normal sensation and normal coordination.  Musculoskeletal Normal Exam - Bilateral-Upper Extremity Strength Normal and Lower Extremity Strength Normal.    Assessment & Plan Victoria Ruff MD; 2/48/1859 10:51 AM)  ANAL SKIN TAG (K64.4) Impression: 52 year old female who presents to the office with complaints of a anal skin tag. This has enlarged over the past few years. On exam today she has a large area of redundant skin in the anterior perianal region consistent with skin tag. I have recommended excision. We have  discussed this in detail including typical postop recovery as well as recurrence. All questions were answered.

## 2017-11-13 DIAGNOSIS — M545 Low back pain: Secondary | ICD-10-CM | POA: Diagnosis not present

## 2017-11-20 NOTE — Progress Notes (Signed)
Subjective:    Patient ID: [name], female    DOB: 1965/10/08, 52 y.o.   MRN: [id number]  HPI The patient is here for follow up.  Hypertension: She is taking her medication daily. She is compliant with a low sodium diet.  She denies chest pain, palpitations, edema, shortness of breath and regular headaches. She is exercising regularly - walking.     Hyperlipidemia: She is taking her medication daily. She is compliant with a low fat/cholesterol diet. She is exercising regularly. She denies myalgias.   Diabetes: She is taking her medication daily as prescribed. She is compliant with a diabetic diet. She is exercising regularly. She has lost weight.  She is up-to-date with an ophthalmology examination.   Depression, anxiety, perimenopause:  We started her on sertraline at her last visit.  She is taking all of her medication as prescribed.    Medications and allergies reviewed with patient and updated if appropriate.  Patient Active Problem List   Diagnosis Date Noted  . Hx of colonic polyp 09/20/2017  . Anal skin tag 09/13/2017  . Adjustment reaction with anxiety and depression 05/24/2017  . Lateral epicondylitis 05/23/2016  . Diabetes ([place]) 05/23/2016  . Snoring 09/07/2015  . Insomnia 06/02/2009  . Obesity 05/19/2008  . Chronic kidney disease 05/19/2008  . Hyperlipidemia 04/17/2008  . COMMON MIGRAINE 04/17/2008  . Essential hypertension 04/17/2008  . ALLERGIC RHINITIS 04/17/2008    Current Outpatient Medications on File Prior to Visit  Medication Sig Dispense Refill  . atorvastatin (LIPITOR) 40 MG tablet Take 1 tablet (40 mg total) by mouth daily. 90 tablet 3  . DOXYCYCLINE MONOHYDRATE PO Take 50 mg by mouth 2 (two) times daily.    . fenofibrate (TRICOR) 145 MG tablet Take 1 tablet (145 mg total) by mouth daily. 90 tablet 3  . labetalol (NORMODYNE) 200 MG tablet TAKE 2 TABLETS (400 MG TOTAL) BY MOUTH 3 (THREE) TIMES DAILY. 540 tablet 2  . sertraline (ZOLOFT) 50 MG  tablet TAKE 1 TABLET BY MOUTH EVERYDAY AT BEDTIME 90 tablet 0  . triamterene-hydrochlorothiazide (DYAZIDE) 37.5-25 MG capsule Take 1 each (1 capsule total) by mouth daily. 90 capsule 3   Current Facility-Administered Medications on File Prior to Visit  Medication Dose Route Frequency Provider Last Rate Last Dose  . 0.9 %  sodium chloride infusion  500 mL Intravenous Once Gatha Mayer, MD        Past Medical History:  Diagnosis Date  . ALLERGIC RHINITIS   . Anal skin tag 09/13/2017  . COMMON MIGRAINE   . GLUCOSE INTOLERANCE   . Hx of colonic polyp 09/20/2017  . Hypercalcemia   . HYPERLIPIDEMIA   . HYPERTENSION   . INSOMNIA   . OBESITY   . RENAL INSUFFICIENCY     Past Surgical History:  Procedure Laterality Date  . BREAST SURGERY  1998   Implants  . hypertension post partum  09/2006   Not precalmpsia    Social History   Socioeconomic History  . Marital status: Married    Spouse name: Not on file  . Number of children: Not on file  . Years of education: Not on file  . Highest education level: Not on file  Occupational History  . Not on file  Social Needs  . Financial resource strain: Not on file  . Food insecurity:    Worry: Not on file    Inability: Not on file  . Transportation needs:    Medical: Not on  file    Non-medical: Not on file  Tobacco Use  . Smoking status: Former Smoker    Last attempt to quit: 05/09/2005    Years since quitting: 12.5  . Smokeless tobacco: Never Used  . Tobacco comment: Married, lives with spouse and son. 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 Alcohol use: Yes    Comment: social  . Drug use: No  . Sexual activity: Not on file  Lifestyle  . Physical activity:    Days per week: Not on file    Minutes per session: Not on file  . Stress: Not on file  Relationships  . Social connections:    Talks on phone: Not on file    Gets together: Not on file    Attends religious  service: Not on file    Active member of club or organization: Not on file    Attends meetings of clubs or organizations: Not on file    Relationship status: Not on file  Other Topics Concern  . Not on file  Social History Narrative  . Not on file    Family History  Problem Relation Age of Onset  . Hypertension Mother   . Other Father        MVA  . Diabetes Maternal Grandfather   . Colon cancer Neg Hx   . Stomach cancer Neg Hx   . Rectal cancer Neg Hx     Review of Systems  Constitutional: Negative for fever.  Respiratory: Negative for cough, shortness of breath and wheezing.   Cardiovascular: Negative for chest pain, palpitations and leg swelling.  Neurological: Positive for headaches (occ). Negative for light-headedness.       Objective:   Vitals:   11/21/17 1536  BP: 118/82  Pulse: 74  Resp: 16  Temp: 98.5 F (36.9 C)  SpO2: 98%   BP Readings from Last 3 Encounters:  11/21/17 118/82  09/13/17 133/88  06/27/17 112/68   Wt Readings from Last 3 Encounters:  11/21/17 206 lb (93.4 kg)  09/13/17 203 lb (92.1 kg)  06/27/17 207 lb 4 oz (94 kg)   Body mass index is 36.49 kg/m.   Physical Exam    Constitutional: Appears well-developed and well-nourished. No distress.  HENT:  Head: Normocephalic and atraumatic.  Neck: Neck supple. No tracheal deviation present. No thyromegaly present.  No cervical lymphadenopathy Cardiovascular: Normal rate, regular rhythm and normal heart sounds.   No murmur heard. No carotid bruit .  No edema Pulmonary/Chest: Effort normal and breath sounds normal. No respiratory distress. No has no wheezes. No rales.  Skin: Skin is warm and dry. Not diaphoretic.  Psychiatric: Normal mood and affect. Behavior is normal.    Diabetic Foot Exam - Simple   Simple Foot Form Diabetic Foot exam was performed with the following findings:  Yes   Visual Inspection No deformities, no ulcerations, no other skin breakdown bilaterally:   Yes Sensation Testing Intact to touch and monofilament testing bilaterally:  Yes Pulse Check Posterior Tibialis and Dorsalis pulse intact bilaterally:  Yes Comments     Assessment & Plan:    See Problem List for Assessment and Plan of chronic medical problems.

## 2017-11-20 NOTE — Patient Instructions (Addendum)
Schedule your eye exam.   Test(s) ordered today. Your results will be released to [facility] (or called to you) after review, usually within 72hours after test completion. If any changes need to be made, you will be notified at that same time.   Medications reviewed and updated.  No changes recommended at this time.    Please followup in 6 months

## 2017-11-21 ENCOUNTER — Ambulatory Visit: Payer: BLUE CROSS/BLUE SHIELD | Admitting: Internal Medicine

## 2017-11-21 ENCOUNTER — Other Ambulatory Visit (INDEPENDENT_AMBULATORY_CARE_PROVIDER_SITE_OTHER): Payer: BLUE CROSS/BLUE SHIELD

## 2017-11-21 ENCOUNTER — Encounter: Payer: Self-pay | Admitting: Internal Medicine

## 2017-11-21 VITALS — BP 118/82 | HR 74 | Temp 98.5°F | Resp 16 | Wt 206.0 lb

## 2017-11-21 DIAGNOSIS — I10 Essential (primary) hypertension: Secondary | ICD-10-CM | POA: Diagnosis not present

## 2017-11-21 DIAGNOSIS — E119 Type 2 diabetes mellitus without complications: Secondary | ICD-10-CM

## 2017-11-21 DIAGNOSIS — E7849 Other hyperlipidemia: Secondary | ICD-10-CM | POA: Diagnosis not present

## 2017-11-21 DIAGNOSIS — F4323 Adjustment disorder with mixed anxiety and depressed mood: Secondary | ICD-10-CM | POA: Diagnosis not present

## 2017-11-21 LAB — HEMOGLOBIN A1C: HEMOGLOBIN A1C: 6 % (ref 4.6–6.5)

## 2017-11-21 LAB — LIPID PANEL
CHOLESTEROL: 203 mg/dL — AB (ref 0–200)
HDL: 36.4 mg/dL — AB (ref >39.00)
Total CHOL/HDL Ratio: 6

## 2017-11-21 LAB — COMPREHENSIVE METABOLIC PANEL
ALBUMIN: 4.7 g/dL (ref 3.5–5.2)
ALT: 39 U/L — ABNORMAL HIGH (ref 0–35)
AST: 26 U/L (ref 0–37)
Alkaline Phosphatase: 48 U/L (ref 39–117)
BUN: 19 mg/dL (ref 6–23)
CO2: 33 mEq/L — ABNORMAL HIGH (ref 19–32)
Calcium: 9.8 mg/dL (ref 8.4–10.5)
Chloride: 100 mEq/L (ref 96–112)
Creatinine, Ser: 1.2 mg/dL (ref 0.40–1.20)
GFR: 50.34 mL/min — AB (ref >60.00)
GLUCOSE: 125 mg/dL — AB (ref 70–99)
POTASSIUM: 3.3 meq/L — AB (ref 3.5–5.1)
SODIUM: 141 meq/L (ref 135–145)
Total Bilirubin: 0.5 mg/dL (ref 0.2–1.2)
Total Protein: 7.6 g/dL (ref 6.0–8.3)

## 2017-11-21 LAB — MICROALBUMIN / CREATININE URINE RATIO
Creatinine,U: 33.2 mg/dL
MICROALB/CREAT RATIO: 2.1 mg/g (ref 0.0–30.0)

## 2017-11-21 LAB — LDL CHOLESTEROL, DIRECT: LDL DIRECT: 131 mg/dL

## 2017-11-21 NOTE — Assessment & Plan Note (Signed)
Check a1c Low sugar / carb diet Stressed regular exercise, continue weight loss efforts

## 2017-11-21 NOTE — Assessment & Plan Note (Signed)
Check lipid panel  Continue daily fibrate Regular exercise and healthy diet encouraged

## 2017-11-21 NOTE — Assessment & Plan Note (Signed)
Controlled, stable Continue current dose of medication  

## 2017-11-21 NOTE — Assessment & Plan Note (Signed)
BP well controlled Current regimen effective and well tolerated Continue current medications at current doses cmp  

## 2017-11-24 ENCOUNTER — Telehealth: Payer: Self-pay

## 2017-11-24 NOTE — Telephone Encounter (Signed)
-----   Message from Binnie Rail, MD sent at 11/24/2017  8:58 AM EDT ----- Cholesterol ok but triglycerides high - continuing to work on lifestyle will help.  kidney function is decreased - slightly worse.  Potassium level is a little low.  Start klor con 20 meq daily.  Urine is normal - no protein.  a1c 6.0 so sugars are well controlled.

## 2017-11-24 NOTE — Telephone Encounter (Signed)
Called pt, [name].   CRM created.  

## 2017-12-01 DIAGNOSIS — J3089 Other allergic rhinitis: Secondary | ICD-10-CM | POA: Diagnosis not present

## 2017-12-17 ENCOUNTER — Other Ambulatory Visit: Payer: Self-pay | Admitting: Internal Medicine

## 2018-02-15 ENCOUNTER — Other Ambulatory Visit: Payer: Self-pay | Admitting: Internal Medicine

## 2018-03-16 DIAGNOSIS — J3089 Other allergic rhinitis: Secondary | ICD-10-CM | POA: Diagnosis not present

## 2018-05-07 ENCOUNTER — Other Ambulatory Visit: Payer: Self-pay | Admitting: Internal Medicine

## 2018-05-28 NOTE — Progress Notes (Signed)
Subjective:    Patient ID: [name], female    DOB: 10/17/1965, 53 y.o.   MRN: [id number]  HPI She is here for a physical exam.   She denies any chagnes in her health.   Right lateral hip pain.  She has pain at times in her upper leg.  She feels it mostly when she first gets up or sometimes laying in bed on that side.  Medications and allergies reviewed with patient and updated if appropriate.  Patient Active Problem List   Diagnosis Date Noted  . Hx of colonic polyp 09/20/2017  . Anal skin tag 09/13/2017  . Adjustment reaction with anxiety and depression 05/24/2017  . Lateral epicondylitis 05/23/2016  . Diabetes ([place]) 05/23/2016  . Snoring 09/07/2015  . Insomnia 06/02/2009  . Obesity 05/19/2008  . Chronic kidney disease 05/19/2008  . Hyperlipidemia 04/17/2008  . COMMON MIGRAINE 04/17/2008  . Essential hypertension 04/17/2008  . ALLERGIC RHINITIS 04/17/2008    Current Outpatient Medications on File Prior to Visit  Medication Sig Dispense Refill  . atorvastatin (LIPITOR) 40 MG tablet TAKE 1 TABLET BY MOUTH DAILY 90 tablet 3  . DOXYCYCLINE MONOHYDRATE PO Take 50 mg by mouth 2 (two) times daily.    . fenofibrate (TRICOR) 145 MG tablet Take 1 tablet (145 mg total) by mouth daily. 90 tablet 3  . labetalol (NORMODYNE) 200 MG tablet TAKE TWO TABLETS BY MOUTH THREE TIMES DAILY GENERIC EQUIVALENT FOR NORMODYNE 540 tablet 2  . sertraline (ZOLOFT) 50 MG tablet TAKE 1 TABLET BY MOUTH EVERYDAY AT BEDTIME 90 tablet 1  . triamterene-hydrochlorothiazide (DYAZIDE) 37.5-25 MG capsule TAKE 1 CAPSULE BY MOUTH DAILY 90 capsule 3   No current facility-administered medications on file prior to visit.     Past Medical History:  Diagnosis Date  . ALLERGIC RHINITIS   . Anal skin tag 09/13/2017  . COMMON MIGRAINE   . GLUCOSE INTOLERANCE   . Hx of colonic polyp 09/20/2017  . Hypercalcemia   . HYPERLIPIDEMIA   . HYPERTENSION   . INSOMNIA   . OBESITY   . RENAL INSUFFICIENCY      Past Surgical History:  Procedure Laterality Date  . BREAST SURGERY  1998   Implants  . hypertension post partum  09/2006   Not precalmpsia    Social History   Socioeconomic History  . Marital status: Married    Spouse name: Not on file  . Number of children: Not on file  . Years of education: Not on file  . Highest education level: Not on file  Occupational History  . Not on file  Social Needs  . Financial resource strain: Not on file  . Food insecurity:    Worry: Not on file    Inability: Not on file  . Transportation needs:    Medical: Not on file    Non-medical: Not on file  Tobacco Use  . Smoking status: Former Smoker    Last attempt to quit: 05/09/2005    Years since quitting: 13.0  . Smokeless tobacco: Never Used  . Tobacco comment: Married, lives with spouse and son. 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 Alcohol use: Yes    Comment: social  . Drug use: No  . Sexual activity: Not on file  Lifestyle  . Physical activity:    Days per week: Not on file    Minutes per session: Not on file  . Stress: Not on  file  Relationships  . Social connections:    Talks on phone: Not on file    Gets together: Not on file    Attends religious service: Not on file    Active member of club or organization: Not on file    Attends meetings of clubs or organizations: Not on file    Relationship status: Not on file  Other Topics Concern  . Not on file  Social History Narrative  . Not on file    Family History  Problem Relation Age of Onset  . Hypertension Mother   . Other Father        MVA  . Diabetes Maternal Grandfather   . Colon cancer Neg Hx   . Stomach cancer Neg Hx   . Rectal cancer Neg Hx     Review of Systems  Constitutional: Negative for chills and fever.  Eyes: Negative for visual disturbance.  Respiratory: Negative for cough, shortness of breath and wheezing.   Cardiovascular: Negative  for chest pain, palpitations and leg swelling (occ if stands prolonged periods of time).  Gastrointestinal: Negative for abdominal pain, blood in stool, constipation, diarrhea and nausea.       No GERD  Genitourinary: Negative for dysuria and hematuria.  Musculoskeletal: Positive for arthralgias (mild arthritis - hands, right hip). Negative for back pain.  Skin: Negative for rash.       Ganglion cyst left wrist  Neurological: Positive for headaches (occ - stress and sinus). Negative for light-headedness.  Psychiatric/Behavioral: Positive for dysphoric mood (controlled). The patient is nervous/anxious (controlled).        Objective:   Vitals:   05/29/18 1451  BP: 122/80  Pulse: 70  Resp: 16  Temp: 98.6 F (37 C)  SpO2: 99%   Filed Weights   05/29/18 1451  Weight: 195 lb (88.5 kg)   Body mass index is 34.54 kg/m.  BP Readings from Last 3 Encounters:  05/29/18 122/80  11/21/17 118/82  09/13/17 133/88    Wt Readings from Last 3 Encounters:  05/29/18 195 lb (88.5 kg)  11/21/17 206 lb (93.4 kg)  09/13/17 203 lb (92.1 kg)     Physical Exam Constitutional: She appears well-developed and well-nourished. No distress.  HENT:  Head: Normocephalic and atraumatic.  Right Ear: External ear normal. Normal ear canal and TM Left Ear: External ear normal.  Normal ear canal and TM Mouth/Throat: Oropharynx is clear and moist.  Eyes: Conjunctivae and EOM are normal.  Neck: Neck supple. No tracheal deviation present. No thyromegaly present.  No carotid bruit  Cardiovascular: Normal rate, regular rhythm and normal heart sounds.   No murmur heard.  No edema. Pulmonary/Chest: Effort normal and breath sounds normal. No respiratory distress. She has no wheezes. She has no rales.  Breast: deferred to Gyn Abdominal: Soft. She exhibits no distension. There is no tenderness.  Lymphadenopathy: She has no cervical adenopathy.  Skin: Skin is warm and dry. She is not diaphoretic.  Psychiatric:  She has a normal mood and affect. Her behavior is normal.        Assessment & Plan:   Physical exam: Screening blood work  ordered Immunizations   Flu vaccine declined,  tdap today, shingrix #1 today Colonoscopy    Up to date  Mammogram   Up to date  Gyn    Up to date  - Dr Marvel Plan Eye exams  Due - will schedule EKG  Done 2014 Exercise  regular Weight  Working on weight loss - has  lost weight Skin  No concerns Substance abuse   none  See Problem List for Assessment and Plan of chronic medical problems.  Follow-up in 6 months

## 2018-05-28 NOTE — Patient Instructions (Addendum)
Tests ordered today. Your results will be released to [place] (or called to you) after review, usually within 72hours after test completion. If any changes need to be made, you will be notified at that same time.  All other Health Maintenance issues reviewed.   All recommended immunizations and age-appropriate screenings are up-to-date or discussed.  Shingles # 1 and tetanus immunizations administered today.   Medications reviewed and updated.  Changes include :  none   Try tumeric for your arthritis.  Avoid aleve or advil.     Please followup in 6 months     Health Maintenance, Female Adopting a healthy lifestyle and getting preventive care can go a long way to promote health and wellness. Talk with your health care provider about what schedule of regular examinations is right for you. This is a good chance for you to check in with your provider about disease prevention and staying healthy. In between checkups, there are plenty of things you can do on your own. Experts have done a lot of research about which lifestyle changes and preventive measures are most likely to keep you healthy. Ask your health care provider for more information. Weight and diet Eat a healthy diet  Be sure to include plenty of vegetables, fruits, low-fat dairy products, and lean protein.  Do not eat a lot of foods high in solid fats, added sugars, or salt.  Get regular exercise. This is one of the most important things you can do for your health. ? Most adults should exercise for at least 150 minutes each week. The exercise should increase your heart rate and make you sweat (moderate-intensity exercise). ? Most adults should also do strengthening exercises at least twice a week. This is in addition to the moderate-intensity exercise. Maintain a healthy weight  Body mass index (BMI) is a measurement that can be used to identify possible weight problems. It estimates body fat based on height and weight. Your  health care provider can help determine your BMI and help you achieve or maintain a healthy weight.  For females 17 years of age and older: ? A BMI below 18.5 is considered underweight. ? A BMI of 18.5 to 24.9 is normal. ? A BMI of 25 to 29.9 is considered overweight. ? A BMI of 30 and above is considered obese. Watch levels of cholesterol and blood lipids  You should start having your blood tested for lipids and cholesterol at 53 years of age, then have this test every 5 years.  You may need to have your cholesterol levels checked more often if: ? Your lipid or cholesterol levels are high. ? You are older than 52 years of age. ? You are at high risk for heart disease. Cancer screening Lung Cancer  Lung cancer screening is recommended for adults 33-59 years old who are at high risk for lung cancer because of a history of smoking.  A yearly low-dose CT scan of the lungs is recommended for people who: ? Currently smoke. ? Have quit within the past 15 years. ? Have at least a 30-pack-year history of smoking. A pack year is smoking an average of one pack of cigarettes a day for 1 year.  Yearly screening should continue until it has been 15 years since you quit.  Yearly screening should stop if you develop a health problem that would prevent you from having lung cancer treatment. Breast Cancer  Practice breast self-awareness. This means understanding how your breasts normally appear and feel.  It  also means doing regular breast self-exams. Let your health care provider know about any changes, no matter how small.  If you are in your 20s or 30s, you should have a clinical breast exam (CBE) by a health care provider every 1-3 years as part of a regular health exam.  If you are 32 or older, have a CBE every year. Also consider having a breast X-ray (mammogram) every year.  If you have a family history of breast cancer, talk to your health care provider about genetic screening.  If you  are at high risk for breast cancer, talk to your health care provider about having an MRI and a mammogram every year.  Breast cancer gene (BRCA) assessment is recommended for women who have family members with BRCA-related cancers. BRCA-related cancers include: ? Breast. ? Ovarian. ? Tubal. ? Peritoneal cancers.  Results of the assessment will determine the need for genetic counseling and BRCA1 and BRCA2 testing. Cervical Cancer Your health care provider may recommend that you be screened regularly for cancer of the pelvic organs (ovaries, uterus, and vagina). This screening involves a pelvic examination, including checking for microscopic changes to the surface of your cervix (Pap test). You may be encouraged to have this screening done every 3 years, beginning at age 77.  For women ages 84-65, health care providers may recommend pelvic exams and Pap testing every 3 years, or they may recommend the Pap and pelvic exam, combined with testing for human papilloma virus (HPV), every 5 years. Some types of HPV increase your risk of cervical cancer. Testing for HPV may also be done on women of any age with unclear Pap test results.  Other health care providers may not recommend any screening for nonpregnant women who are considered low risk for pelvic cancer and who do not have symptoms. Ask your health care provider if a screening pelvic exam is right for you.  If you have had past treatment for cervical cancer or a condition that could lead to cancer, you need Pap tests and screening for cancer for at least 20 years after your treatment. If Pap tests have been discontinued, your risk factors (such as having a new sexual partner) need to be reassessed to determine if screening should resume. Some women have medical problems that increase the chance of getting cervical cancer. In these cases, your health care provider may recommend more frequent screening and Pap tests. Colorectal Cancer  This type of  cancer can be detected and often prevented.  Routine colorectal cancer screening usually begins at 52 years of age and continues through 52 years of age.  Your health care provider may recommend screening at an earlier age if you have risk factors for colon cancer.  Your health care provider may also recommend using home test kits to check for hidden blood in the stool.  A small camera at the end of a tube can be used to examine your colon directly (sigmoidoscopy or colonoscopy). This is done to check for the earliest forms of colorectal cancer.  Routine screening usually begins at age 31.  Direct examination of the colon should be repeated every 5-10 years through 52 years of age. However, you may need to be screened more often if early forms of precancerous polyps or small growths are found. Skin Cancer  Check your skin from head to toe regularly.  Tell your health care provider about any new moles or changes in moles, especially if there is a change in a mole's  shape or color.  Also tell your health care provider if you have a mole that is larger than the size of a pencil eraser.  Always use sunscreen. Apply sunscreen liberally and repeatedly throughout the day.  Protect yourself by wearing long sleeves, pants, a wide-brimmed hat, and sunglasses whenever you are outside. Heart disease, diabetes, and high blood pressure  High blood pressure causes heart disease and increases the risk of stroke. High blood pressure is more likely to develop in: ? People who have blood pressure in the high end of the normal range (130-139/85-89 mm Hg). ? People who are overweight or obese. ? People who are African American.  If you are 47-24 years of age, have your blood pressure checked every 3-5 years. If you are 74 years of age or older, have your blood pressure checked every year. You should have your blood pressure measured twice-once when you are at a hospital or clinic, and once when you are not  at a hospital or clinic. Record the average of the two measurements. To check your blood pressure when you are not at a hospital or clinic, you can use: ? An automated blood pressure machine at a pharmacy. ? A home blood pressure monitor.  If you are between 69 years and 20 years old, ask your health care provider if you should take aspirin to prevent strokes.  Have regular diabetes screenings. This involves taking a blood sample to check your fasting blood sugar level. ? If you are at a normal weight and have a low risk for diabetes, have this test once every three years after 52 years of age. ? If you are overweight and have a high risk for diabetes, consider being tested at a younger age or more often. Preventing infection Hepatitis B  If you have a higher risk for hepatitis B, you should be screened for this virus. You are considered at high risk for hepatitis B if: ? You were born in a country where hepatitis B is common. Ask your health care provider which countries are considered high risk. ? Your parents were born in a high-risk country, and you have not been immunized against hepatitis B (hepatitis B vaccine). ? You have HIV or AIDS. ? You use needles to inject street drugs. ? You live with someone who has hepatitis B. ? You have had sex with someone who has hepatitis B. ? You get hemodialysis treatment. ? You take certain medicines for conditions, including cancer, organ transplantation, and autoimmune conditions. Hepatitis C  Blood testing is recommended for: ? Everyone born from [birth year] through 1964. ? Anyone with known risk factors for hepatitis C. Sexually transmitted infections (STIs)  You should be screened for sexually transmitted infections (STIs) including gonorrhea and chlamydia if: ? You are sexually active and are younger than 52 years of age. ? You are older than 52 years of age and your health care provider tells you that you are at risk for this type of  infection. ? Your sexual activity has changed since you were last screened and you are at an increased risk for chlamydia or gonorrhea. Ask your health care provider if you are at risk.  If you do not have HIV, but are at risk, it may be recommended that you take a prescription medicine daily to prevent HIV infection. This is called pre-exposure prophylaxis (PrEP). You are considered at risk if: ? You are sexually active and do not regularly use condoms or know the HIV status  of your partner(s). ? You take drugs by injection. ? You are sexually active with a partner who has HIV. Talk with your health care provider about whether you are at high risk of being infected with HIV. If you choose to begin PrEP, you should first be tested for HIV. You should then be tested every 3 months for as long as you are taking PrEP. Pregnancy  If you are premenopausal and you may become pregnant, ask your health care provider about preconception counseling.  If you may become pregnant, take 400 to 800 micrograms (mcg) of folic acid every day.  If you want to prevent pregnancy, talk to your health care provider about birth control (contraception). Osteoporosis and menopause  Osteoporosis is a disease in which the bones lose minerals and strength with aging. This can result in serious bone fractures. Your risk for osteoporosis can be identified using a bone density scan.  If you are 3 years of age or older, or if you are at risk for osteoporosis and fractures, ask your health care provider if you should be screened.  Ask your health care provider whether you should take a calcium or vitamin D supplement to lower your risk for osteoporosis.  Menopause may have certain physical symptoms and risks.  Hormone replacement therapy may reduce some of these symptoms and risks. Talk to your health care provider about whether hormone replacement therapy is right for you. Follow these instructions at home:  Schedule  regular health, dental, and eye exams.  Stay current with your immunizations.  Do not use any tobacco products including cigarettes, chewing tobacco, or electronic cigarettes.  If you are pregnant, do not drink alcohol.  If you are breastfeeding, limit how much and how often you drink alcohol.  Limit alcohol intake to no more than 1 drink per day for nonpregnant women. One drink equals 12 ounces of beer, 5 ounces of wine, or 1 ounces of hard liquor.  Do not use street drugs.  Do not share needles.  Ask your health care provider for help if you need support or information about quitting drugs.  Tell your health care provider if you often feel depressed.  Tell your health care provider if you have ever been abused or do not feel safe at home. This information is not intended to replace advice given to you by your health care provider. Make sure you discuss any questions you have with your health care provider. Document Released: 11/08/2010 Document Revised: 10/01/2015 Document Reviewed: 01/27/2015 Elsevier Interactive Patient Education  2019 [employer].

## 2018-05-29 ENCOUNTER — Encounter: Payer: Self-pay | Admitting: Internal Medicine

## 2018-05-29 ENCOUNTER — Ambulatory Visit (INDEPENDENT_AMBULATORY_CARE_PROVIDER_SITE_OTHER): Payer: BLUE CROSS/BLUE SHIELD | Admitting: Internal Medicine

## 2018-05-29 ENCOUNTER — Other Ambulatory Visit (INDEPENDENT_AMBULATORY_CARE_PROVIDER_SITE_OTHER): Payer: BLUE CROSS/BLUE SHIELD

## 2018-05-29 VITALS — BP 122/80 | HR 70 | Temp 98.6°F | Resp 16 | Ht 63.0 in | Wt 195.0 lb

## 2018-05-29 DIAGNOSIS — E119 Type 2 diabetes mellitus without complications: Secondary | ICD-10-CM

## 2018-05-29 DIAGNOSIS — N189 Chronic kidney disease, unspecified: Secondary | ICD-10-CM

## 2018-05-29 DIAGNOSIS — F4323 Adjustment disorder with mixed anxiety and depressed mood: Secondary | ICD-10-CM

## 2018-05-29 DIAGNOSIS — Z6841 Body Mass Index (BMI) 40.0 and over, adult: Secondary | ICD-10-CM

## 2018-05-29 DIAGNOSIS — I10 Essential (primary) hypertension: Secondary | ICD-10-CM

## 2018-05-29 DIAGNOSIS — E7849 Other hyperlipidemia: Secondary | ICD-10-CM

## 2018-05-29 DIAGNOSIS — M67432 Ganglion, left wrist: Secondary | ICD-10-CM | POA: Insufficient documentation

## 2018-05-29 DIAGNOSIS — Z23 Encounter for immunization: Secondary | ICD-10-CM | POA: Diagnosis not present

## 2018-05-29 DIAGNOSIS — Z0000 Encounter for general adult medical examination without abnormal findings: Secondary | ICD-10-CM

## 2018-05-29 LAB — COMPREHENSIVE METABOLIC PANEL
ALT: 33 U/L (ref 0–35)
AST: 22 U/L (ref 0–37)
Albumin: 4.5 g/dL (ref 3.5–5.2)
Alkaline Phosphatase: 45 U/L (ref 39–117)
BILIRUBIN TOTAL: 0.6 mg/dL (ref 0.2–1.2)
BUN: 17 mg/dL (ref 6–23)
CALCIUM: 9.6 mg/dL (ref 8.4–10.5)
CO2: 29 mEq/L (ref 19–32)
Chloride: 101 mEq/L (ref 96–112)
Creatinine, Ser: 1.18 mg/dL (ref 0.40–1.20)
GFR: 48.19 mL/min — ABNORMAL LOW (ref >60.00)
Glucose, Bld: 103 mg/dL — ABNORMAL HIGH (ref 70–99)
Potassium: 3.6 mEq/L (ref 3.5–5.1)
Sodium: 140 mEq/L (ref 135–145)
Total Protein: 7.2 g/dL (ref 6.0–8.3)

## 2018-05-29 LAB — CBC WITH DIFFERENTIAL/PLATELET
Basophils Absolute: 0.1 10*3/uL (ref 0.0–0.1)
Basophils Relative: 0.9 % (ref 0.0–3.0)
Eosinophils Absolute: 0.1 10*3/uL (ref 0.0–0.7)
Eosinophils Relative: 2.2 % (ref 0.0–5.0)
HCT: 42.1 % (ref 36.0–46.0)
Hemoglobin: 14.1 g/dL (ref 12.0–15.0)
Lymphocytes Relative: 39.3 % (ref 12.0–46.0)
Lymphs Abs: 2.5 10*3/uL (ref 0.7–4.0)
MCHC: 33.4 g/dL (ref 30.0–36.0)
MCV: 89.7 fl (ref 78.0–100.0)
Monocytes Absolute: 0.5 10*3/uL (ref 0.1–1.0)
Monocytes Relative: 8.7 % (ref 3.0–12.0)
NEUTROS PCT: 48.9 % (ref 43.0–77.0)
Neutro Abs: 3.1 10*3/uL (ref 1.4–7.7)
Platelets: 254 10*3/uL (ref 150.0–400.0)
RBC: 4.69 Mil/uL (ref 3.87–5.11)
RDW: 12.9 % (ref 11.5–15.5)
WBC: 6.3 10*3/uL (ref 4.0–10.5)

## 2018-05-29 LAB — LIPID PANEL
CHOL/HDL RATIO: 4
Cholesterol: 156 mg/dL (ref 0–200)
HDL: 35.2 mg/dL — ABNORMAL LOW (ref >39.00)
NonHDL: 120.8
Triglycerides: 253 mg/dL — ABNORMAL HIGH (ref 0.0–149.0)
VLDL: 50.6 mg/dL — ABNORMAL HIGH (ref 0.0–40.0)

## 2018-05-29 LAB — LDL CHOLESTEROL, DIRECT: Direct LDL: 96 mg/dL

## 2018-05-29 LAB — TSH: TSH: 1.35 u[IU]/mL (ref 0.35–4.50)

## 2018-05-29 LAB — HEMOGLOBIN A1C: Hgb A1c MFr Bld: 5.9 % (ref 4.6–6.5)

## 2018-05-29 NOTE — Assessment & Plan Note (Signed)
Check a1c Low sugar / carb diet Stressed regular exercise Continue weight loss efforts F/u in 6 months

## 2018-05-29 NOTE — Assessment & Plan Note (Signed)
BP well controlled Current regimen effective and well tolerated Continue current medications at current doses cmp  

## 2018-05-29 NOTE — Assessment & Plan Note (Addendum)
With hypertension, hyperlipidemia and diabetes Working on weight loss and has lost weight Continue regular exercise and healthy diet Follow-up in 6 months

## 2018-05-29 NOTE — Assessment & Plan Note (Signed)
cmp

## 2018-05-29 NOTE — Assessment & Plan Note (Signed)
Large ganglion cyst left anterior-lateral wrist Causes some discomfort at times Deferred referral to hand orthopedics at this time-she will let me know when she wants referral

## 2018-05-29 NOTE — Assessment & Plan Note (Addendum)
Check lipid panel, CBC, TSH Continue daily statin Regular exercise and healthy diet encouraged

## 2018-05-29 NOTE — Assessment & Plan Note (Signed)
Controlled, stable Continue current dose of medication  

## 2018-06-04 ENCOUNTER — Ambulatory Visit: Payer: BLUE CROSS/BLUE SHIELD | Admitting: Internal Medicine

## 2018-06-04 ENCOUNTER — Encounter: Payer: Self-pay | Admitting: Internal Medicine

## 2018-06-04 ENCOUNTER — Other Ambulatory Visit: Payer: BLUE CROSS/BLUE SHIELD

## 2018-06-04 VITALS — BP 142/88 | HR 74 | Temp 98.3°F | Resp 16 | Ht 63.0 in | Wt 195.0 lb

## 2018-06-04 DIAGNOSIS — R319 Hematuria, unspecified: Secondary | ICD-10-CM | POA: Diagnosis not present

## 2018-06-04 DIAGNOSIS — N3001 Acute cystitis with hematuria: Secondary | ICD-10-CM | POA: Insufficient documentation

## 2018-06-04 LAB — POCT URINALYSIS DIPSTICK
Bilirubin, UA: NEGATIVE
GLUCOSE UA: NEGATIVE
Ketones, UA: NEGATIVE
Nitrite, UA: NEGATIVE
Protein, UA: POSITIVE — AB
Spec Grav, UA: 1.015 (ref 1.010–1.025)
Urobilinogen, UA: NEGATIVE E.U./dL — AB
pH, UA: 7.5 (ref 5.0–8.0)

## 2018-06-04 MED ORDER — NITROFURANTOIN MONOHYD MACRO 100 MG PO CAPS: 100.0000 mg | ORAL_CAPSULE | Freq: Two times a day (BID) | ORAL | 0 refills | Status: DC

## 2018-06-04 MED ORDER — FLUCONAZOLE 150 MG PO TABS: 150.0000 mg | ORAL_TABLET | Freq: Once | ORAL | 0 refills | Status: AC

## 2018-06-04 NOTE — Assessment & Plan Note (Signed)
Urine dip consistent with UTI Will send urine for culture Take the antibiotic as prescribed.   Take tylenol if needed.   Increase your water intake.  Call if no improvement   

## 2018-06-04 NOTE — Progress Notes (Signed)
Subjective:    Patient ID: [name], female    DOB: 07-24-1965, 53 y.o.   MRN: [id number]  HPI The patient is here for an acute visit.   ? UTI; her symptoms started 2 days ago.  She states chills, hematuria, lower pelvic pressure and pain, urinary urgency and frequency and lower back pain.  She denies any fever, abdominal pain, nausea, dysuria.  She has tried Azo and Aleve for the pain.  The Aleve has helped.  Medications and allergies reviewed with patient and updated if appropriate.  Patient Active Problem List   Diagnosis Date Noted  . Ganglion cyst of wrist, left 05/29/2018  . Hx of colonic polyp 09/20/2017  . Anal skin tag 09/13/2017  . Adjustment reaction with anxiety and depression 05/24/2017  . Lateral epicondylitis 05/23/2016  . Diabetes ([place]) 05/23/2016  . Snoring 09/07/2015  . Insomnia 06/02/2009  . Obesity 05/19/2008  . Chronic kidney disease 05/19/2008  . Hyperlipidemia 04/17/2008  . COMMON MIGRAINE 04/17/2008  . Essential hypertension 04/17/2008  . ALLERGIC RHINITIS 04/17/2008    Current Outpatient Medications on File Prior to Visit  Medication Sig Dispense Refill  . atorvastatin (LIPITOR) 40 MG tablet TAKE 1 TABLET BY MOUTH DAILY 90 tablet 3  . DOXYCYCLINE MONOHYDRATE PO Take 50 mg by mouth 2 (two) times daily.    . fenofibrate (TRICOR) 145 MG tablet Take 1 tablet (145 mg total) by mouth daily. 90 tablet 3  . labetalol (NORMODYNE) 200 MG tablet TAKE TWO TABLETS BY MOUTH THREE TIMES DAILY GENERIC EQUIVALENT FOR NORMODYNE 540 tablet 2  . sertraline (ZOLOFT) 50 MG tablet TAKE 1 TABLET BY MOUTH EVERYDAY AT BEDTIME 90 tablet 1  . triamterene-hydrochlorothiazide (DYAZIDE) 37.5-25 MG capsule TAKE 1 CAPSULE BY MOUTH DAILY 90 capsule 3   No current facility-administered medications on file prior to visit.     Past Medical History:  Diagnosis Date  . ALLERGIC RHINITIS   . Anal skin tag 09/13/2017  . COMMON MIGRAINE   . GLUCOSE INTOLERANCE   . Hx of  colonic polyp 09/20/2017  . Hypercalcemia   . HYPERLIPIDEMIA   . HYPERTENSION   . INSOMNIA   . OBESITY   . RENAL INSUFFICIENCY     Past Surgical History:  Procedure Laterality Date  . BREAST SURGERY  1998   Implants  . hypertension post partum  09/2006   Not precalmpsia    Social History   Socioeconomic History  . Marital status: Married    Spouse name: Not on file  . Number of children: Not on file  . Years of education: Not on file  . Highest education level: Not on file  Occupational History  . Not on file  Social Needs  . Financial resource strain: Not on file  . Food insecurity:    Worry: Not on file    Inability: Not on file  . Transportation needs:    Medical: Not on file    Non-medical: Not on file  Tobacco Use  . Smoking status: Former Smoker    Last attempt to quit: 05/09/2005    Years since quitting: 13.0  . Smokeless tobacco: Never Used  . Tobacco comment: Married, lives with spouse and son. 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 Alcohol use: Yes    Comment: social  . Drug use: No  . Sexual activity: Not on file  Lifestyle  . Physical activity:    Days per  week: Not on file    Minutes per session: Not on file  . Stress: Not on file  Relationships  . Social connections:    Talks on phone: Not on file    Gets together: Not on file    Attends religious service: Not on file    Active member of club or organization: Not on file    Attends meetings of clubs or organizations: Not on file    Relationship status: Not on file  Other Topics Concern  . Not on file  Social History Narrative  . Not on file    Family History  Problem Relation Age of Onset  . Hypertension Mother   . Other Father        MVA  . Diabetes Maternal Grandfather   . Colon cancer Neg Hx   . Stomach cancer Neg Hx   . Rectal cancer Neg Hx     Review of Systems  Constitutional: Positive for chills. Negative for  fever.  Gastrointestinal: Negative for abdominal pain and nausea.  Genitourinary: Positive for hematuria, pelvic pain (pressure) and urgency. Negative for dysuria and frequency.  Musculoskeletal: Positive for back pain (lower back).       Objective:   Vitals:   06/04/18 1318  BP: (!) 142/88  Pulse: 74  Resp: 16  Temp: 98.3 F (36.8 C)  SpO2: 97%   BP Readings from Last 3 Encounters:  06/04/18 (!) 142/88  05/29/18 122/80  11/21/17 118/82   Wt Readings from Last 3 Encounters:  06/04/18 195 lb (88.5 kg)  05/29/18 195 lb (88.5 kg)  11/21/17 206 lb (93.4 kg)   Body mass index is 34.54 kg/m.   Physical Exam Constitutional:      General: She is not in acute distress.    Appearance: Normal appearance. She is not ill-appearing or diaphoretic.  Abdominal:     General: There is no distension.     Palpations: Abdomen is soft.     Tenderness: There is abdominal tenderness (Mild tenderness suprapubic region). There is no right CVA tenderness, left CVA tenderness, guarding or rebound.  Skin:    General: Skin is warm and dry.  Neurological:     Mental Status: She is alert.            Assessment & Plan:    See Problem List for Assessment and Plan of chronic medical problems.

## 2018-06-04 NOTE — Patient Instructions (Signed)
Take the antibiotic as prescribed.  Take tylenol if needed.     Increase your water intake.   Call if no improvement     Urinary Tract Infection, Adult A urinary tract infection (UTI) is an infection of any part of the urinary tract, which includes the kidneys, ureters, bladder, and urethra. These organs make, store, and get rid of urine in the body. UTI can be a bladder infection (cystitis) or kidney infection (pyelonephritis). What are the causes? This infection may be caused by fungi, viruses, or bacteria. Bacteria are the most common cause of UTIs. This condition can also be caused by repeated incomplete emptying of the bladder during urination. What increases the risk? This condition is more likely to develop if:  You ignore your need to urinate or hold urine for long periods of time.  You do not empty your bladder completely during urination.  You wipe back to front after urinating or having a bowel movement, if you are female.  You are uncircumcised, if you are female.  You are constipated.  You have a urinary catheter that stays in place (indwelling).  You have a weak defense (immune) system.  You have a medical condition that affects your bowels, kidneys, or bladder.  You have diabetes.  You take antibiotic medicines frequently or for long periods of time, and the antibiotics no longer work well against certain types of infections (antibiotic resistance).  You take medicines that irritate your urinary tract.  You are exposed to chemicals that irritate your urinary tract.  You are female.  What are the signs or symptoms? Symptoms of this condition include:  Fever.  Frequent urination or passing small amounts of urine frequently.  Needing to urinate urgently.  Pain or burning with urination.  Urine that smells bad or unusual.  Cloudy urine.  Pain in the lower abdomen or back.  Trouble urinating.  Blood in the urine.  Vomiting or being less hungry than  normal.  Diarrhea or abdominal pain.  Vaginal discharge, if you are female.  How is this diagnosed? This condition is diagnosed with a medical history and physical exam. You will also need to provide a urine sample to test your urine. Other tests may be done, including:  Blood tests.  Sexually transmitted disease (STD) testing.  If you have had more than one UTI, a cystoscopy or imaging studies may be done to determine the cause of the infections. How is this treated? Treatment for this condition often includes a combination of two or more of the following:  Antibiotic medicine.  Other medicines to treat less common causes of UTI.  Over-the-counter medicines to treat pain.  Drinking enough water to stay hydrated.  Follow these instructions at home:  Take over-the-counter and prescription medicines only as told by your health care provider.  If you were prescribed an antibiotic, take it as told by your health care provider. Do not stop taking the antibiotic even if you start to feel better.  Avoid alcohol, caffeine, tea, and carbonated beverages. They can irritate your bladder.  Drink enough fluid to keep your urine clear or pale yellow.  Keep all follow-up visits as told by your health care provider. This is important.  Make sure to: ? Empty your bladder often and completely. Do not hold urine for long periods of time. ? Empty your bladder before and after sex. ? Wipe from front to back after a bowel movement if you are female. Use each tissue one time when you   wipe. Contact a health care provider if:  You have back pain.  You have a fever.  You feel nauseous or vomit.  Your symptoms do not get better after 3 days.  Your symptoms go away and then return. Get help right away if:  You have severe back pain or lower abdominal pain.  You are vomiting and cannot keep down any medicines or water. This information is not intended to replace advice given to you by  your health care provider. Make sure you discuss any questions you have with your health care provider. Document Released: 02/02/2005 Document Revised: 10/07/2015 Document Reviewed: 03/16/2015 Elsevier Interactive Patient Education  2018 [employer]   

## 2018-06-06 LAB — URINE CULTURE
MICRO NUMBER:: 108736
SPECIMEN QUALITY:: ADEQUATE

## 2018-06-11 DIAGNOSIS — R05 Cough: Secondary | ICD-10-CM | POA: Diagnosis not present

## 2018-06-11 DIAGNOSIS — B349 Viral infection, unspecified: Secondary | ICD-10-CM | POA: Diagnosis not present

## 2018-06-13 ENCOUNTER — Encounter: Payer: Self-pay | Admitting: Internal Medicine

## 2018-06-13 ENCOUNTER — Ambulatory Visit: Payer: BLUE CROSS/BLUE SHIELD | Admitting: Internal Medicine

## 2018-06-13 VITALS — BP 124/82 | HR 93 | Temp 99.3°F | Resp 16 | Ht 63.0 in | Wt 195.0 lb

## 2018-06-13 DIAGNOSIS — J101 Influenza due to other identified influenza virus with other respiratory manifestations: Secondary | ICD-10-CM

## 2018-06-13 LAB — POCT INFLUENZA A/B: Influenza A, POC: POSITIVE — AB

## 2018-06-13 MED ORDER — OSELTAMIVIR PHOSPHATE 75 MG PO CAPS: 75.0000 mg | ORAL_CAPSULE | Freq: Two times a day (BID) | ORAL | 0 refills | Status: DC

## 2018-06-13 MED ORDER — HYDROCODONE-HOMATROPINE 5-1.5 MG/5ML PO SYRP: 5.0000 mL | ORAL_SOLUTION | Freq: Three times a day (TID) | ORAL | 0 refills | Status: DC | PRN

## 2018-06-13 NOTE — Progress Notes (Signed)
Subjective:    Patient ID: [name], female    DOB: [birth year], 53 y.o.   MRN: [id number]  HPI She is here for an acute visit for cold symptoms.  Her symptoms started 4 days ago  She is experiencing chest heaviness, coughing and it is productive of yellow sputum, back pain, body aches, headaches, lightheadedness, fever, chills, decreased appetite, sinus pain, nasal congestion and sore throat.  She denies SOB and wheeze.   She has taken tylenol, advil, benadryl at night.  She is sleeping.     Medications and allergies reviewed with patient and updated if appropriate.  Patient Active Problem List   Diagnosis Date Noted  . Acute cystitis with hematuria 06/04/2018  . Ganglion cyst of wrist, left 05/29/2018  . Hx of colonic polyp 09/20/2017  . Anal skin tag 09/13/2017  . Adjustment reaction with anxiety and depression 05/24/2017  . Lateral epicondylitis 05/23/2016  . Diabetes ([place]) 05/23/2016  . Snoring 09/07/2015  . Insomnia 06/02/2009  . Obesity 05/19/2008  . Chronic kidney disease 05/19/2008  . Hyperlipidemia 04/17/2008  . COMMON MIGRAINE 04/17/2008  . Essential hypertension 04/17/2008  . ALLERGIC RHINITIS 04/17/2008    Current Outpatient Medications on File Prior to Visit  Medication Sig Dispense Refill  . atorvastatin (LIPITOR) 40 MG tablet TAKE 1 TABLET BY MOUTH DAILY 90 tablet 3  . DOXYCYCLINE MONOHYDRATE PO Take 50 mg by mouth 2 (two) times daily.    . fenofibrate (TRICOR) 145 MG tablet Take 1 tablet (145 mg total) by mouth daily. 90 tablet 3  . labetalol (NORMODYNE) 200 MG tablet TAKE TWO TABLETS BY MOUTH THREE TIMES DAILY GENERIC EQUIVALENT FOR NORMODYNE 540 tablet 2  . sertraline (ZOLOFT) 50 MG tablet TAKE 1 TABLET BY MOUTH EVERYDAY AT BEDTIME 90 tablet 1  . triamterene-hydrochlorothiazide (DYAZIDE) 37.5-25 MG capsule TAKE 1 CAPSULE BY MOUTH DAILY 90 capsule 3   No current facility-administered medications on file prior to visit.     Past Medical  History:  Diagnosis Date  . ALLERGIC RHINITIS   . Anal skin tag 09/13/2017  . COMMON MIGRAINE   . GLUCOSE INTOLERANCE   . Hx of colonic polyp 09/20/2017  . Hypercalcemia   . HYPERLIPIDEMIA   . HYPERTENSION   . INSOMNIA   . OBESITY   . RENAL INSUFFICIENCY     Past Surgical History:  Procedure Laterality Date  . BREAST SURGERY  1998   Implants  . hypertension post partum  09/2006   Not precalmpsia    Social History   Socioeconomic History  . Marital status: Married    Spouse name: Not on file  . Number of children: Not on file  . Years of education: Not on file  . Highest education level: Not on file  Occupational History  . Not on file  Social Needs  . Financial resource strain: Not on file  . Food insecurity:    Worry: Not on file    Inability: Not on file  . Transportation needs:    Medical: Not on file    Non-medical: Not on file  Tobacco Use  . Smoking status: Former Smoker    Last attempt to quit: 05/09/2005    Years since quitting: 13.1  . Smokeless tobacco: Never Used  . Tobacco comment: Married, lives with spouse and son. 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 Alcohol use: Yes    Comment: social  . Drug use: No  .  Sexual activity: Not on file  Lifestyle  . Physical activity:    Days per week: Not on file    Minutes per session: Not on file  . Stress: Not on file  Relationships  . Social connections:    Talks on phone: Not on file    Gets together: Not on file    Attends religious service: Not on file    Active member of club or organization: Not on file    Attends meetings of clubs or organizations: Not on file    Relationship status: Not on file  Other Topics Concern  . Not on file  Social History Narrative  . Not on file    Family History  Problem Relation Age of Onset  . Hypertension Mother   . Other Father        MVA  . Diabetes Maternal Grandfather   . Colon cancer Neg Hx    . Stomach cancer Neg Hx   . Rectal cancer Neg Hx     Review of Systems  Constitutional: Positive for appetite change, chills and fever.  HENT: Positive for congestion, sinus pain and sore throat (from coughing). Negative for ear pain (ears itch).   Respiratory: Positive for cough and chest tightness. Negative for shortness of breath and wheezing.   Gastrointestinal: Positive for nausea (at times). Negative for diarrhea.  Musculoskeletal: Positive for back pain and myalgias.  Neurological: Positive for light-headedness and headaches.       Objective:   Vitals:   06/13/18 1307  BP: 124/82  Pulse: 93  Resp: 16  Temp: 99.3 F (37.4 C)  SpO2: 98%   Filed Weights   06/13/18 1307  Weight: 195 lb (88.5 kg)   Body mass index is 34.54 kg/m.  Wt Readings from Last 3 Encounters:  06/13/18 195 lb (88.5 kg)  06/04/18 195 lb (88.5 kg)  05/29/18 195 lb (88.5 kg)     Physical Exam GENERAL APPEARANCE: mildly ill appearing, NAD EYES: conjunctiva clear, no icterus HEENT: bilateral tympanic membranes and ear canals normal, oropharynx with no erythema, no thyromegaly, trachea midline, no cervical or supraclavicular lymphadenopathy LUNGS: Clear to auscultation without wheeze or crackles, unlabored breathing, good air entry bilaterally CARDIOVASCULAR: Normal S1,S2 without murmurs, no edema SKIN: warm, dry        Assessment & Plan:   See Problem List for Assessment and Plan of chronic medical problems.

## 2018-06-13 NOTE — Patient Instructions (Signed)
Your flu test is positive.   tamiflu and a cough syrup was sent to your pharmacy.  Take these.  Increase your rest, fluids and continue the over the counter cold medications.     Call if no improvement       Influenza, Adult Influenza, more commonly known as "the flu," is a viral infection that mainly affects the respiratory tract. The respiratory tract includes organs that help you breathe, such as the lungs, nose, and throat. The flu causes many symptoms similar to the common cold along with high fever and body aches. The flu spreads easily from person to person (is contagious). Getting a flu shot (influenza vaccination) every year is the best way to prevent the flu. What are the causes? This condition is caused by the influenza virus. You can get the virus by:  Breathing in droplets that are in the air from an infected person's cough or sneeze.  Touching something that has been exposed to the virus (has been contaminated) and then touching your mouth, nose, or eyes. What increases the risk? The following factors may make you more likely to get the flu:  Not washing or sanitizing your hands often.  Having close contact with many people during cold and flu season.  Touching your mouth, eyes, or nose without first washing or sanitizing your hands.  Not getting a yearly (annual) flu shot. You may have a higher risk for the flu, including serious problems such as a lung infection (pneumonia), if you:  Are older than 65.  Are pregnant.  Have a weakened disease-fighting system (immune system). You may have a weakened immune system if you: ? Have HIV or AIDS. ? Are undergoing chemotherapy. ? Are taking medicines that reduce (suppress) the activity of your immune system.  Have a long-term (chronic) illness, such as heart disease, kidney disease, diabetes, or lung disease.  Have a liver disorder.  Are severely overweight (morbidly obese).  Have anemia. This is a condition that  affects your red blood cells.  Have asthma. What are the signs or symptoms? Symptoms of this condition usually begin suddenly and last 4-14 days. They may include:  Fever and chills.  Headaches, body aches, or muscle aches.  Sore throat.  Cough.  Runny or stuffy (congested) nose.  Chest discomfort.  Poor appetite.  Weakness or fatigue.  Dizziness.  Nausea or vomiting. How is this diagnosed? This condition may be diagnosed based on:  Your symptoms and medical history.  A physical exam.  Swabbing your nose or throat and testing the fluid for the influenza virus. How is this treated? If the flu is diagnosed early, you can be treated with medicine that can help reduce how severe the illness is and how long it lasts (antiviral medicine). This may be given by mouth (orally) or through an IV. Taking care of yourself at home can help relieve symptoms. Your health care provider may recommend:  Taking over-the-counter medicines.  Drinking plenty of fluids. In many cases, the flu goes away on its own. If you have severe symptoms or complications, you may be treated in a hospital. Follow these instructions at home: Activity  Rest as needed and get plenty of sleep.  Stay home from work or school as told by your health care provider. Unless you are visiting your health care provider, avoid leaving home until your fever has been gone for 24 hours without taking medicine. Eating and drinking  Take an oral rehydration solution (ORS). This is a drink  that is sold at pharmacies and retail stores.  Drink enough fluid to keep your urine pale yellow.  Drink clear fluids in small amounts as you are able. Clear fluids include water, ice chips, diluted fruit juice, and low-calorie sports drinks.  Eat bland, easy-to-digest foods in small amounts as you are able. These foods include bananas, applesauce, rice, lean meats, toast, and crackers.  Avoid drinking fluids that contain a lot of  sugar or caffeine, such as energy drinks, regular sports drinks, and soda.  Avoid alcohol.  Avoid spicy or fatty foods. General instructions      Take over-the-counter and prescription medicines only as told by your health care provider.  Use a cool mist humidifier to add humidity to the air in your home. This can make it easier to breathe.  Cover your mouth and nose when you cough or sneeze.  Wash your hands with soap and water often, especially after you cough or sneeze. If soap and water are not available, use alcohol-based hand sanitizer.  Keep all follow-up visits as told by your health care provider. This is important. How is this prevented?   Get an annual flu shot. You may get the flu shot in late summer, fall, or winter. Ask your health care provider when you should get your flu shot.  Avoid contact with people who are sick during cold and flu season. This is generally fall and winter. Contact a health care provider if:  You develop new symptoms.  You have: ? Chest pain. ? Diarrhea. ? A fever.  Your cough gets worse.  You produce more mucus.  You feel nauseous or you vomit. Get help right away if:  You develop shortness of breath or difficulty breathing.  Your skin or nails turn a bluish color.  You have severe pain or stiffness in your neck.  You develop a sudden headache or sudden pain in your face or ear.  You cannot eat or drink without vomiting. Summary  Influenza, more commonly known as "the flu," is a viral infection that primarily affects your respiratory tract.  Symptoms of the flu usually begin suddenly and last 4-14 days.  Getting an annual flu shot is the best way to prevent getting the flu.  Stay home from work or school as told by your health care provider. Unless you are visiting your health care provider, avoid leaving home until your fever has been gone for 24 hours without taking medicine.  Keep all follow-up visits as told by  your health care provider. This is important. This information is not intended to replace advice given to you by your health care provider. Make sure you discuss any questions you have with your health care provider. Document Released: 04/22/2000 Document Revised: 10/11/2017 Document Reviewed: 10/11/2017 Elsevier Interactive Patient Education  2019 [employer].

## 2018-06-13 NOTE — Assessment & Plan Note (Signed)
POCT flu A positive tamiflu bid x 5 days Hycodan cough syrup otc cold meds Rest, fluids  Call if no improvement

## 2018-06-15 ENCOUNTER — Other Ambulatory Visit: Payer: Self-pay | Admitting: Internal Medicine

## 2018-07-01 ENCOUNTER — Other Ambulatory Visit: Payer: Self-pay | Admitting: Internal Medicine

## 2018-10-17 ENCOUNTER — Other Ambulatory Visit: Payer: Self-pay | Admitting: Internal Medicine

## 2018-11-28 ENCOUNTER — Other Ambulatory Visit (INDEPENDENT_AMBULATORY_CARE_PROVIDER_SITE_OTHER): Payer: BC Managed Care – PPO

## 2018-11-28 ENCOUNTER — Other Ambulatory Visit: Payer: Self-pay

## 2018-11-28 ENCOUNTER — Ambulatory Visit (INDEPENDENT_AMBULATORY_CARE_PROVIDER_SITE_OTHER): Payer: BC Managed Care – PPO | Admitting: Internal Medicine

## 2018-11-28 ENCOUNTER — Encounter: Payer: Self-pay | Admitting: Internal Medicine

## 2018-11-28 VITALS — BP 134/88 | HR 85 | Temp 98.6°F | Resp 16 | Ht 63.0 in | Wt 210.0 lb

## 2018-11-28 DIAGNOSIS — E119 Type 2 diabetes mellitus without complications: Secondary | ICD-10-CM

## 2018-11-28 DIAGNOSIS — I10 Essential (primary) hypertension: Secondary | ICD-10-CM

## 2018-11-28 DIAGNOSIS — Z23 Encounter for immunization: Secondary | ICD-10-CM | POA: Diagnosis not present

## 2018-11-28 DIAGNOSIS — E7849 Other hyperlipidemia: Secondary | ICD-10-CM

## 2018-11-28 DIAGNOSIS — F4323 Adjustment disorder with mixed anxiety and depressed mood: Secondary | ICD-10-CM

## 2018-11-28 DIAGNOSIS — Z6841 Body Mass Index (BMI) 40.0 and over, adult: Secondary | ICD-10-CM

## 2018-11-28 LAB — LDL CHOLESTEROL, DIRECT: Direct LDL: 103 mg/dL

## 2018-11-28 LAB — COMPREHENSIVE METABOLIC PANEL
ALT: 34 U/L (ref 0–35)
AST: 21 U/L (ref 0–37)
Albumin: 4.7 g/dL (ref 3.5–5.2)
Alkaline Phosphatase: 51 U/L (ref 39–117)
BUN: 16 mg/dL (ref 6–23)
CO2: 29 mEq/L (ref 19–32)
Calcium: 9.9 mg/dL (ref 8.4–10.5)
Chloride: 100 mEq/L (ref 96–112)
Creatinine, Ser: 0.96 mg/dL (ref 0.40–1.20)
GFR: 61.02 mL/min (ref >60.00)
Glucose, Bld: 123 mg/dL — ABNORMAL HIGH (ref 70–99)
Potassium: 3.6 mEq/L (ref 3.5–5.1)
Sodium: 138 mEq/L (ref 135–145)
Total Bilirubin: 0.5 mg/dL (ref 0.2–1.2)
Total Protein: 7.4 g/dL (ref 6.0–8.3)

## 2018-11-28 LAB — LIPID PANEL
Cholesterol: 161 mg/dL (ref 0–200)
HDL: 36.4 mg/dL — ABNORMAL LOW (ref >39.00)
NonHDL: 124.77
Total CHOL/HDL Ratio: 4
Triglycerides: 272 mg/dL — ABNORMAL HIGH (ref 0.0–149.0)
VLDL: 54.4 mg/dL — ABNORMAL HIGH (ref 0.0–40.0)

## 2018-11-28 LAB — MICROALBUMIN / CREATININE URINE RATIO
Creatinine,U: 61.7 mg/dL
Microalb Creat Ratio: 1.1 mg/g (ref 0.0–30.0)
Microalb, Ur: <0.7 mg/dL (ref 0.0–1.9)

## 2018-11-28 LAB — HEMOGLOBIN A1C: Hgb A1c MFr Bld: 6.1 % (ref 4.6–6.5)

## 2018-11-28 NOTE — Progress Notes (Signed)
Subjective:    Patient ID: [name], female    DOB: [birth year], 53 y.o.   MRN: [id number]  HPI The patient is here for follow up.  She is not exercising regularly.   She plans on starting to walk again.  Diabetes: She is taking her medication daily as prescribed. She is compliant with a diabetic diet, but two months she was not very careful.   She denies numbness/tingling in her feet and foot lesions. She is not up-to-date with an ophthalmology examination.   Hypertension: She is taking her medication daily. She is compliant with a low sodium diet.  She denies chest pain, palpitations, edema, shortness of breath and regular headaches. She does not monitor her blood pressure at home.    Hyperlipidemia: She is taking her medication daily. She is compliant with a low fat/cholesterol diet. She denies myalgias.   Depression, Anxiety: She is taking her medication daily as prescribed. She denies any side effects from the medication. She feels her anxiety and depression are well controlled and she is happy with her current dose of medication.   Obesity: For 2 months she was in [place] and she is not eating well with exercising.  She knows she needs to get back into those good habits and plans on starting.     Medications and allergies reviewed with patient and updated if appropriate.  Patient Active Problem List   Diagnosis Date Noted  . Influenza A 06/13/2018  . Acute cystitis with hematuria 06/04/2018  . Ganglion cyst of wrist, left 05/29/2018  . Hx of colonic polyp 09/20/2017  . Anal skin tag 09/13/2017  . Adjustment reaction with anxiety and depression 05/24/2017  . Lateral epicondylitis 05/23/2016  . Diabetes ([place]) 05/23/2016  . Snoring 09/07/2015  . Insomnia 06/02/2009  . Obesity 05/19/2008  . Chronic kidney disease 05/19/2008  . Hyperlipidemia 04/17/2008  . COMMON MIGRAINE 04/17/2008  . Essential hypertension 04/17/2008  . ALLERGIC RHINITIS 04/17/2008     Current Outpatient Medications on File Prior to Visit  Medication Sig Dispense Refill  . atorvastatin (LIPITOR) 40 MG tablet TAKE 1 TABLET BY MOUTH DAILY 90 tablet 3  . DOXYCYCLINE MONOHYDRATE PO Take 50 mg by mouth 2 (two) times daily.    . fenofibrate (TRICOR) 145 MG tablet TAKE 1 TABLET BY MOUTH DAILY 90 tablet 3  . labetalol (NORMODYNE) 200 MG tablet TAKE TWO TABLETS BY MOUTH THREE TIMES DAILY GENERIC EQUIVALENT FOR NORMODYNE 540 tablet 0  . sertraline (ZOLOFT) 50 MG tablet TAKE 1 TABLET BY MOUTH EVERYDAY AT BEDTIME 90 tablet 1  . triamterene-hydrochlorothiazide (DYAZIDE) 37.5-25 MG capsule TAKE 1 CAPSULE BY MOUTH DAILY 90 capsule 3   No current facility-administered medications on file prior to visit.     Past Medical History:  Diagnosis Date  . ALLERGIC RHINITIS   . Anal skin tag 09/13/2017  . COMMON MIGRAINE   . GLUCOSE INTOLERANCE   . Hx of colonic polyp 09/20/2017  . Hypercalcemia   . HYPERLIPIDEMIA   . HYPERTENSION   . INSOMNIA   . OBESITY   . RENAL INSUFFICIENCY     Past Surgical History:  Procedure Laterality Date  . BREAST SURGERY  1998   Implants  . hypertension post partum  09/2006   Not precalmpsia    Social History   Socioeconomic History  . Marital status: Married    Spouse name: Not on file  . Number of children: Not on file  . Years of education: Not on file  .  Highest education level: Not on file  Occupational History  . Not on file  Social Needs  . Financial resource strain: Not on file  . Food insecurity    Worry: Not on file    Inability: Not on file  . Transportation needs    Medical: Not on file    Non-medical: Not on file  Tobacco Use  . Smoking status: Former Smoker    Quit date: 05/09/2005    Years since quitting: 13.5  . Smokeless tobacco: Never Used  . Tobacco comment: Married, lives with spouse and son. 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 Alcohol use: Yes     Comment: social  . Drug use: No  . Sexual activity: Not on file  Lifestyle  . Physical activity    Days per week: Not on file    Minutes per session: Not on file  . Stress: Not on file  Relationships  . Social Herbalist on phone: Not on file    Gets together: Not on file    Attends religious service: Not on file    Active member of club or organization: Not on file    Attends meetings of clubs or organizations: Not on file    Relationship status: Not on file  Other Topics Concern  . Not on file  Social History Narrative  . Not on file    Family History  Problem Relation Age of Onset  . Hypertension Mother   . Other Father        MVA  . Diabetes Maternal Grandfather   . Colon cancer Neg Hx   . Stomach cancer Neg Hx   . Rectal cancer Neg Hx     Review of Systems  Constitutional: Negative for chills and fever.  Respiratory: Negative for cough, shortness of breath and wheezing.   Cardiovascular: Negative for chest pain, palpitations and leg swelling.  Musculoskeletal: Negative for myalgias.  Neurological: Positive for headaches (occ, stress related). Negative for light-headedness and numbness.       Objective:   Vitals:   11/28/18 1453  BP: 134/88  Pulse: 85  Resp: 16  Temp: 98.6 F (37 C)  SpO2: 98%   BP Readings from Last 3 Encounters:  11/28/18 134/88  06/13/18 124/82  06/04/18 (!) 142/88   Wt Readings from Last 3 Encounters:  11/28/18 210 lb (95.3 kg)  06/13/18 195 lb (88.5 kg)  06/04/18 195 lb (88.5 kg)   Body mass index is 37.2 kg/m.   Physical Exam    Constitutional: Appears well-developed and well-nourished. No distress.  HENT:  Head: Normocephalic and atraumatic.  Neck: Neck supple. No tracheal deviation present. No thyromegaly present.  No cervical lymphadenopathy Cardiovascular: Normal rate, regular rhythm and normal heart sounds.   No murmur heard. No carotid bruit .  No edema Pulmonary/Chest: Effort normal and breath sounds  normal. No respiratory distress. No has no wheezes. No rales.  Skin: Skin is warm and dry. Not diaphoretic.  Psychiatric: Normal mood and affect. Behavior is normal.    Diabetic Foot Exam - Simple   Simple Foot Form Diabetic Foot exam was performed with the following findings: Yes 11/28/2018  3:39 PM  Visual Inspection No deformities, no ulcerations, no other skin breakdown bilaterally: Yes Sensation Testing Intact to touch and monofilament testing bilaterally: Yes Pulse Check Posterior Tibialis and Dorsalis pulse intact bilaterally: Yes Comments      Assessment &  Plan:    See Problem List for Assessment and Plan of chronic medical problems.

## 2018-11-28 NOTE — Patient Instructions (Addendum)
  Tests ordered today. Your results will be released to [place] (or called to you) after review.  If any changes need to be made, you will be notified at that same time.  Your received your second shingles vaccine.   Medications reviewed and updated.  Changes include :   none  Your prescription(s) have been submitted to your pharmacy. Please take as directed and contact our office if you believe you are having problem(s) with the medication(s).   Please followup in 6 months

## 2018-11-28 NOTE — Assessment & Plan Note (Signed)
Check lipid panel  Continue daily statin Regular exercise and healthy diet encouraged  

## 2018-11-28 NOTE — Assessment & Plan Note (Signed)
Controlled, stable Continue current dose of medication  

## 2018-11-28 NOTE — Assessment & Plan Note (Signed)
With diabetes, high blood pressure and high cholesterol Has not been exercising, plans on starting to walk Has not been compliant with a diabetic diet, but has Artie started to make changes Will be working on weight loss Follow-up in 6 weeks

## 2018-11-28 NOTE — Assessment & Plan Note (Signed)
Blood pressure borderline high today She will start exercising regularly and is working on improving her diet There has been some increased stress since her mother has moved in with her No change in medications CMP

## 2018-12-22 ENCOUNTER — Other Ambulatory Visit: Payer: Self-pay | Admitting: Internal Medicine

## 2019-01-02 ENCOUNTER — Other Ambulatory Visit: Payer: Self-pay | Admitting: Internal Medicine

## 2019-01-07 ENCOUNTER — Other Ambulatory Visit: Payer: Self-pay | Admitting: Internal Medicine

## 2019-02-13 ENCOUNTER — Telehealth: Payer: Self-pay

## 2019-02-13 NOTE — Telephone Encounter (Signed)
Copied from [place] [id number]. Topic: General - Call Back - No Documentation >> Feb 13, 2019  9:39 AM Sheran Luz wrote: Patient returning call to [facility]. No documentation found.

## 2019-02-13 NOTE — Telephone Encounter (Signed)
Spoke with pt in regards her mother. Documented in mothers chart.

## 2019-03-26 ENCOUNTER — Other Ambulatory Visit: Payer: Self-pay

## 2019-03-26 MED ORDER — TRIAMTERENE-HCTZ 37.5-25 MG PO CAPS: 1.0000 | ORAL_CAPSULE | Freq: Every day | ORAL | 3 refills | Status: DC

## 2019-05-06 ENCOUNTER — Other Ambulatory Visit: Payer: Self-pay | Admitting: Internal Medicine

## 2019-05-06 ENCOUNTER — Ambulatory Visit (INDEPENDENT_AMBULATORY_CARE_PROVIDER_SITE_OTHER): Payer: BC Managed Care – PPO | Admitting: Internal Medicine

## 2019-05-06 ENCOUNTER — Ambulatory Visit (INDEPENDENT_AMBULATORY_CARE_PROVIDER_SITE_OTHER)
Admission: RE | Admit: 2019-05-06 | Discharge: 2019-05-06 | Disposition: A | Discharge status: Home / Self Care | Payer: BC Managed Care – PPO | Source: Ambulatory Visit | Attending: Internal Medicine | Admitting: Internal Medicine

## 2019-05-06 ENCOUNTER — Encounter: Payer: Self-pay | Admitting: Internal Medicine

## 2019-05-06 ENCOUNTER — Other Ambulatory Visit: Payer: Self-pay

## 2019-05-06 VITALS — BP 150/98 | HR 90 | Temp 99.4°F | Ht 63.0 in | Wt 214.0 lb

## 2019-05-06 DIAGNOSIS — G44209 Tension-type headache, unspecified, not intractable: Secondary | ICD-10-CM

## 2019-05-06 DIAGNOSIS — G4452 New daily persistent headache (NDPH): Secondary | ICD-10-CM

## 2019-05-06 DIAGNOSIS — R519 Headache, unspecified: Secondary | ICD-10-CM | POA: Diagnosis not present

## 2019-05-06 DIAGNOSIS — E119 Type 2 diabetes mellitus without complications: Secondary | ICD-10-CM | POA: Diagnosis not present

## 2019-05-06 DIAGNOSIS — I10 Essential (primary) hypertension: Secondary | ICD-10-CM

## 2019-05-06 LAB — URINALYSIS, ROUTINE W REFLEX MICROSCOPIC
Bilirubin Urine: NEGATIVE
Hgb urine dipstick: NEGATIVE
Ketones, ur: NEGATIVE
Leukocytes,Ua: NEGATIVE
Nitrite: NEGATIVE
RBC / HPF: NONE SEEN (ref 0–?)
Specific Gravity, Urine: 1.02 (ref 1.000–1.030)
Total Protein, Urine: NEGATIVE
Urine Glucose: NEGATIVE
Urobilinogen, UA: 0.2 (ref 0.0–1.0)
WBC, UA: NONE SEEN (ref 0–?)
pH: 7.5 (ref 5.0–8.0)

## 2019-05-06 LAB — CBC WITH DIFFERENTIAL/PLATELET
Basophils Absolute: 0.1 10*3/uL (ref 0.0–0.1)
Basophils Relative: 1.1 % (ref 0.0–3.0)
Eosinophils Absolute: 0.3 10*3/uL (ref 0.0–0.7)
Eosinophils Relative: 4.9 % (ref 0.0–5.0)
HCT: 43.5 % (ref 36.0–46.0)
Hemoglobin: 14.6 g/dL (ref 12.0–15.0)
Lymphocytes Relative: 35.9 % (ref 12.0–46.0)
Lymphs Abs: 2.3 10*3/uL (ref 0.7–4.0)
MCHC: 33.5 g/dL (ref 30.0–36.0)
MCV: 91.4 fl (ref 78.0–100.0)
Monocytes Absolute: 0.5 10*3/uL (ref 0.1–1.0)
Monocytes Relative: 8.5 % (ref 3.0–12.0)
Neutro Abs: 3.2 10*3/uL (ref 1.4–7.7)
Neutrophils Relative %: 49.6 % (ref 43.0–77.0)
Platelets: 242 10*3/uL (ref 150.0–400.0)
RBC: 4.76 Mil/uL (ref 3.87–5.11)
RDW: 13.3 % (ref 11.5–15.5)
WBC: 6.3 10*3/uL (ref 4.0–10.5)

## 2019-05-06 LAB — BASIC METABOLIC PANEL
BUN: 20 mg/dL (ref 6–23)
CO2: 30 mEq/L (ref 19–32)
Calcium: 9.8 mg/dL (ref 8.4–10.5)
Chloride: 101 mEq/L (ref 96–112)
Creatinine, Ser: 0.98 mg/dL (ref 0.40–1.20)
GFR: 59.49 mL/min — ABNORMAL LOW (ref >60.00)
Glucose, Bld: 166 mg/dL — ABNORMAL HIGH (ref 70–99)
Potassium: 4.1 mEq/L (ref 3.5–5.1)
Sodium: 139 mEq/L (ref 135–145)

## 2019-05-06 LAB — SEDIMENTATION RATE: Sed Rate: 5 mm/hr (ref 0–30)

## 2019-05-06 LAB — TSH: TSH: 1.3 u[IU]/mL (ref 0.35–4.50)

## 2019-05-06 LAB — HEMOGLOBIN A1C: Hgb A1c MFr Bld: 6.7 % — ABNORMAL HIGH (ref 4.6–6.5)

## 2019-05-06 MED ORDER — AMLODIPINE BESYLATE 5 MG PO TABS: 5.0000 mg | ORAL_TABLET | Freq: Every day | ORAL | 0 refills | Status: DC

## 2019-05-06 MED ORDER — BUTALBITAL-APAP-CAFFEINE 50-300-40 MG PO CAPS: 1.0000 | ORAL_CAPSULE | Freq: Three times a day (TID) | ORAL | 0 refills | Status: DC | PRN

## 2019-05-06 NOTE — Progress Notes (Addendum)
Subjective:  Patient ID: [name], female    DOB: 06-Mar-1966  Age: 53 y.o. MRN: [id number]  CC: Hypertension and Headache  NEW TO ME  This visit occurred during the SARS-CoV-2 public health emergency.  Safety protocols were in place, including screening questions prior to the visit, additional usage of staff PPE, and extensive cleaning of exam room while observing appropriate contact time as indicated for disinfecting solutions.    HPI [facility] presents for a 3-week history of left-sided constant headache that is mostly located in the left parietal region but also radiates to her left eye.  She has had headaches before but nothing this persistent and severe.  She has not gotten much symptom relief with ibuprofen or Excedrin.  The headache keeps her awake at night.  She also has mild discomfort in her neck that she describes as soreness.  She denies nausea, vomiting, photophobia, photophobia, paresthesias, changes in her vision or hearing, or slurred speech, or ataxia.  Outpatient Medications Prior to Visit  Medication Sig Dispense Refill  . atorvastatin (LIPITOR) 40 MG tablet TAKE 1 TABLET BY MOUTH DAILY 90 tablet 3  . azelastine (ASTELIN) 0.1 % nasal spray azelastine 137 mcg (0.1 %) nasal spray aerosol    . Diclofenac Sodium (PENNSAID) 2 % SOLN Pennsaid 20 mg/gram/actuation (2 %) topical soln in metered-dose pump  APPLY ONE PUMP TOPICALLY TO THE AFFECTED AREA TWICE DAILY AS NEEDED    . fenofibrate (TRICOR) 145 MG tablet TAKE 1 TABLET BY MOUTH DAILY 90 tablet 1  . labetalol (NORMODYNE) 200 MG tablet TAKE 2 TABLETS BY MOUTH 3 TIMES DAILY GENERIC EQUIVALENT FOR NORMODYNE 540 tablet 1  . RESTASIS 0.05 % ophthalmic emulsion INSTILL 1 DROP INTO BOTH EYES TWICE A DAY INSTILL 1 DROP INTO BOTH EYES TWICE DAILY    . sertraline (ZOLOFT) 50 MG tablet TAKE 1 TABLET BY MOUTH EVERYDAY AT BEDTIME 90 tablet 1  . triamterene-hydrochlorothiazide (DYAZIDE) 37.5-25 MG capsule Take 1 each (1  capsule total) by mouth daily. 90 capsule 3  . DOXYCYCLINE MONOHYDRATE PO Take 50 mg by mouth 2 (two) times daily.    . Olopatadine HCl (PAZEO) 0.7 % SOLN Pazeo 0.7 % eye drops  INSTILL 1 DROP INTO BOTH EYES ONCE A DAY AS NEEDED     No facility-administered medications prior to visit.    ROS Review of Systems  Constitutional: Negative.  Negative for chills, diaphoresis, fatigue and fever.  HENT: Negative for sore throat and trouble swallowing.   Eyes: Negative.   Respiratory: Negative for cough, chest tightness, shortness of breath and wheezing.   Cardiovascular: Negative for chest pain, palpitations and leg swelling.  Gastrointestinal: Negative for abdominal pain, diarrhea, nausea and vomiting.  Endocrine: Negative.   Genitourinary: Negative.  Negative for difficulty urinating.  Musculoskeletal: Positive for neck pain. Negative for back pain and neck stiffness.  Skin: Negative for color change.  Neurological: Positive for headaches. Negative for dizziness, seizures, syncope, facial asymmetry, weakness, light-headedness and numbness.  Hematological: Negative for adenopathy. Does not bruise/bleed easily.  Psychiatric/Behavioral: Negative.     Objective:  BP (!) 150/98 (BP Location: Left Arm, Patient Position: Sitting, Cuff Size: Large)   Pulse 90   Temp 99.4 F (37.4 C) (Oral)   Ht 5\' 3"  (1.6 m)   Wt 214 lb (97.1 kg)   SpO2 97%   BMI 37.91 kg/m   BP Readings from Last 3 Encounters:  05/06/19 (!) 150/98  11/28/18 134/88  06/13/18 124/82  Wt Readings from Last 3 Encounters:  05/06/19 214 lb (97.1 kg)  11/28/18 210 lb (95.3 kg)  06/13/18 195 lb (88.5 kg)    Physical Exam Vitals reviewed.  Constitutional:      General: She is not in acute distress.    Appearance: She is well-developed. She is not ill-appearing, toxic-appearing or diaphoretic.  Eyes:     General: No scleral icterus.    Extraocular Movements: Extraocular movements intact.     Right eye: Normal  extraocular motion and no nystagmus.     Left eye: Normal extraocular motion and no nystagmus.     Pupils: Pupils are equal, round, and reactive to light. Pupils are equal.     Right eye: Pupil is round and reactive.     Left eye: Pupil is round and reactive.  Neck:     Meningeal: Brudzinski's sign absent.  Cardiovascular:     Rate and Rhythm: Normal rate and regular rhythm.     Heart sounds: No murmur.  Pulmonary:     Effort: Pulmonary effort is normal.     Breath sounds: No stridor. No wheezing, rhonchi or rales.  Abdominal:     General: Abdomen is protuberant. Bowel sounds are normal. There is no distension.     Palpations: Abdomen is soft. There is no hepatomegaly, splenomegaly or mass.     Tenderness: There is no abdominal tenderness. There is no guarding.  Musculoskeletal:        General: Normal range of motion.     Cervical back: Normal range of motion and neck supple. No rigidity.     Right lower leg: No edema.     Left lower leg: No edema.  Lymphadenopathy:     Cervical: No cervical adenopathy.  Skin:    General: Skin is warm and dry.     Findings: No rash.  Neurological:     Mental Status: She is alert.     Cranial Nerves: Cranial nerves are intact.     Sensory: Sensation is intact.     Motor: No weakness or tremor.     Coordination: Romberg sign negative. Coordination normal.     Deep Tendon Reflexes: Reflexes abnormal. Babinski sign absent on the right side. Babinski sign absent on the left side.     Reflex Scores:      Tricep reflexes are 1+ on the right side and 1+ on the left side.      Bicep reflexes are 1+ on the right side and 1+ on the left side.      Brachioradialis reflexes are 1+ on the right side and 1+ on the left side.      Patellar reflexes are 1+ on the right side and 2+ on the left side.      Achilles reflexes are 1+ on the right side and 1+ on the left side.    Lab Results  Component Value Date   WBC 6.3 05/29/2018   HGB 14.1 05/29/2018    HCT 42.1 05/29/2018   PLT 254.0 05/29/2018   GLUCOSE 123 (H) 11/28/2018   CHOL 161 11/28/2018   TRIG 272.0 (H) 11/28/2018   HDL 36.40 (L) 11/28/2018   LDLDIRECT 103.0 11/28/2018   LDLCALC 84 10/23/2013   ALT 34 11/28/2018   AST 21 11/28/2018   NA 138 11/28/2018   K 3.6 11/28/2018   CL 100 11/28/2018   CREATININE 0.96 11/28/2018   BUN 16 11/28/2018   CO2 29 11/28/2018   TSH 1.35 05/29/2018  HGBA1C 6.1 11/28/2018   MICROALBUR <0.7 11/28/2018    MM Digital Screening W/ Implants  Result Date: 04/23/2013 CLINICAL DATA:  Screening. EXAM: DIGITAL SCREENING BILATERAL MAMMOGRAM WITH IMPLANTS AND CAD The patient has retroglandular saline implants. Standard and implant displaced views were performed. The right implant is collapsed. COMPARISON:  None ACR Breast Density Category c: The breasts are heterogeneously dense, which may obscure small masses FINDINGS: There are no findings suspicious for malignancy. Images were processed with CAD. IMPRESSION: No mammographic evidence of malignancy. A result letter of this screening mammogram will be mailed directly to the patient. RECOMMENDATION: Screening mammogram in one year. (Code:SM-B-01Y) BI-RADS CATEGORY  1:  Negative Electronically Signed   By: Ulyess Blossom M.D.   On: 04/23/2013 14:53    Assessment & Plan:   Johnsie was seen today for hypertension and headache.  Diagnoses and all orders for this visit:  Type 2 diabetes mellitus without complication, without long-term current use of insulin (HCC) -     Basic metabolic panel -     Hemoglobin A1c  Essential hypertension- Her blood pressure is not adequately well controlled and she is symptomatic with a headache.  I have asked her to add amlodipine to her current regimen. -     Basic metabolic panel -     TSH -     Urinalysis, Routine w reflex microscopic -     amLODipine (NORVASC) 5 MG tablet; Take 1 tablet (5 mg total) by mouth daily.  New daily persistent headache- She has a new  onset left-sided headache with alarming features that include nocturnal component and an abnormal DTR in her R patella.  I will check a sed rate to screen for temporal arteritis and vasculitis.  Will also monitor for signs of infection and other complications with a CBC and basic metabolic panel.  I recommended she undergo a CT scan of the brain stat to screen for mass, bleeding, NPH, CVA, SDH, etc. Will treat for tension headache. -     CT Head Wo Contrast; Future -     CBC with Differential -     Sedimentation rate   I am having Elainna J. Gracie start on amLODipine and Butalbital-APAP-Caffeine. I am also having her maintain her DOXYCYCLINE MONOHYDRATE PO, atorvastatin, sertraline, fenofibrate, labetalol, triamterene-hydrochlorothiazide, azelastine, Restasis, Pennsaid, and Pazeo.  Meds ordered this encounter  Medications  . amLODipine (NORVASC) 5 MG tablet    Sig: Take 1 tablet (5 mg total) by mouth daily.    Dispense:  90 tablet    Refill:  0  . Butalbital-APAP-Caffeine 50-300-40 MG CAPS    Sig: Take 1 capsule by mouth every 8 (eight) hours as needed.    Dispense:  35 capsule    Refill:  0     Follow-up: Return in about 3 weeks (around 05/27/2019).  Scarlette Calico, MD

## 2019-05-06 NOTE — Addendum Note (Signed)
Addended by: Janith Lima on: 05/06/2019 02:50 PM   Modules accepted: Orders

## 2019-05-06 NOTE — Patient Instructions (Signed)
General Headache Without Cause °A headache is pain or discomfort that is felt around the head or neck area. There are many causes and types of headaches. In some cases, the cause may not be found. °Follow these instructions at home: °Watch your condition for any changes. Let your doctor know about them. Take these steps to help with your condition: °Managing pain ° °  ° °· Take over-the-counter and prescription medicines only as told by your doctor. °· Lie down in a dark, quiet room when you have a headache. °· If told, put ice on your head and neck area: °? Put ice in a plastic bag. °? Place a towel between your skin and the bag. °? Leave the ice on for 20 minutes, 2-3 times per day. °· If told, put heat on the affected area. Use the heat source that your doctor recommends, such as a moist heat pack or a heating pad. °? Place a towel between your skin and the heat source. °? Leave the heat on for 20-30 minutes. °? Remove the heat if your skin turns bright red. This is very important if you are unable to feel pain, heat, or cold. You may have a greater risk of getting burned. °· Keep lights dim if bright lights bother you or make your headaches worse. °Eating and drinking °· Eat meals on a regular schedule. °· If you drink alcohol: °? Limit how much you use to: °§ 0-1 drink a day for women. °§ 0-2 drinks a day for men. °? Be aware of how much alcohol is in your drink. In the [place] one drink equals one 12 oz bottle of beer (355 mL), one 5 oz glass of wine (148 mL), or one 1½ oz glass of hard liquor (44 mL). °· Stop drinking caffeine, or reduce how much caffeine you drink. °General instructions ° °· Keep a journal to find out if certain things bring on headaches. For example, write down: °? What you eat and drink. °? How much sleep you get. °? Any change to your diet or medicines. °· Get a massage or try other ways to relax. °· Limit stress. °· Sit up straight. Do not tighten (tense) your muscles. °· Do not use any  products that contain nicotine or tobacco. This includes cigarettes, e-cigarettes, and chewing tobacco. If you need help quitting, ask your doctor. °· Exercise regularly as told by your doctor. °· Get enough sleep. This often means 7-9 hours of sleep each night. °· Keep all follow-up visits as told by your doctor. This is important. °Contact a doctor if: °· Your symptoms are not helped by medicine. °· You have a headache that feels different than the other headaches. °· You feel sick to your stomach (nauseous) or you throw up (vomit). °· You have a fever. °Get help right away if: °· Your headache gets very bad quickly. °· Your headache gets worse after a lot of physical activity. °· You keep throwing up. °· You have a stiff neck. °· You have trouble seeing. °· You have trouble speaking. °· You have pain in the eye or ear. °· Your muscles are weak or you lose muscle control. °· You lose your balance or have trouble walking. °· You feel like you will pass out (faint) or you pass out. °· You are mixed up (confused). °· You have a seizure. °Summary °· A headache is pain or discomfort that is felt around the head or neck area. °· There are many causes and   types of headaches. In some cases, the cause may not be found. °· Keep a journal to help find out what causes your headaches. Watch your condition for any changes. Let your doctor know about them. °· Contact a doctor if you have a headache that is different from usual, or if your headache is not helped by medicine. °· Get help right away if your headache gets very bad, you throw up, you have trouble seeing, you lose your balance, or you have a seizure. °This information is not intended to replace advice given to you by your health care provider. Make sure you discuss any questions you have with your health care provider. °Document Released: 02/02/2008 Document Revised: 11/13/2017 Document Reviewed: 11/13/2017 °Elsevier Patient Education © 2020 [employer] ° °

## 2019-05-07 ENCOUNTER — Other Ambulatory Visit: Payer: Self-pay | Admitting: Internal Medicine

## 2019-05-07 ENCOUNTER — Telehealth: Payer: Self-pay

## 2019-05-07 DIAGNOSIS — F411 Generalized anxiety disorder: Secondary | ICD-10-CM | POA: Insufficient documentation

## 2019-05-07 DIAGNOSIS — F419 Anxiety disorder, unspecified: Secondary | ICD-10-CM | POA: Insufficient documentation

## 2019-05-07 MED ORDER — ALPRAZOLAM 0.5 MG PO TABS: 0.5000 mg | ORAL_TABLET | Freq: Two times a day (BID) | ORAL | 0 refills | Status: DC | PRN

## 2019-05-07 NOTE — Telephone Encounter (Signed)
Is there any place that can do the MRI sooner?

## 2019-05-07 NOTE — Telephone Encounter (Signed)
Pt's appt rescheduled elsewhere - pt is aware

## 2019-05-07 NOTE — Telephone Encounter (Signed)
Called patient to see what questions she had. States that she would like to know if there is anything that [facility] could do to get this appointment sooner. States that with the headache she is having, there is no way she can wait until 05/25/2019 to get this done. Please advise.   Copied from [place] [id number]. Topic: General - Other >> May 07, 2019 10:48 AM Greggory Keen D wrote: Reason for CRM: Pt called asking to speak with Colletta [place] in regards to scheduling with her MRI.  CB#  [phone]

## 2019-05-08 ENCOUNTER — Telehealth: Payer: Self-pay

## 2019-05-08 NOTE — Telephone Encounter (Signed)
Copied from [place] [id number]. Topic: General - Other >> May 08, 2019  3:38 PM Mcneil, Ja-Kwan wrote: Reason for CRM: Pt called to request that either Lovena Le or [place] return her call to discuss another possible medication that she can take for the headaches. Cb# [phone]

## 2019-05-08 NOTE — Telephone Encounter (Signed)
Called pt back and she stated that the butalbital is not taking the pounding headache away. Pt wanted to know if there another alternative rx that can be sent in.

## 2019-05-13 ENCOUNTER — Encounter: Payer: Self-pay | Admitting: Internal Medicine

## 2019-05-13 DIAGNOSIS — R519 Headache, unspecified: Secondary | ICD-10-CM | POA: Diagnosis not present

## 2019-05-14 ENCOUNTER — Telehealth: Payer: Self-pay | Admitting: Internal Medicine

## 2019-05-14 NOTE — Telephone Encounter (Signed)
Patient informed of Dr Quay Burow message. States understanding and does not have any further questions.

## 2019-05-14 NOTE — Telephone Encounter (Signed)
Can you do me a favor and let [name] know Dr. Quay Burow response. Thank you so much!

## 2019-05-14 NOTE — Telephone Encounter (Signed)
She has an appointment in 3 days-we can discuss them.

## 2019-05-14 NOTE — Telephone Encounter (Signed)
Copied from [place] [id number]. Topic: General - Other >> May 14, 2019 11:10 AM Celene Kras wrote: Reason for CRM: Pt called stating that the medication for her headaches, Butalbital, is not helping a whole lot and is requesting to have something stronger sent in. Please advise.    [id number] [place] [id number] [place] [id number] Phone: [phone] Fax: [phone] Open 24 hours

## 2019-05-14 NOTE — Telephone Encounter (Signed)
Needs appointment

## 2019-05-16 NOTE — Patient Instructions (Addendum)
   Medications reviewed and updated.  Changes include :   Start the medrol dose pak and take as directed.  Stop the fioricet (headache medication you are taking every 8 hrs).  Start the gabapentin at night.  Take 100 mg the first night and you can increase to 300 mg at night if tolerated.    Your prescription(s) have been submitted to your pharmacy. Please take as directed and contact our office if you believe you are having problem(s) with the medication(s).   Monitor your BP at home.   Update me with your BP readings and headaches.    Please followup in 6 months

## 2019-05-16 NOTE — Progress Notes (Signed)
Subjective:    Patient ID: [name], female    DOB: 10/25/1965, 54 y.o.   MRN: [id number]  HPI The patient is here for follow up of their chronic medical problems, including diabetes, hypertension, hyperlipidemia, depression, anxiety and obesity.  She also wants to review imaging results.  She was seen for headaches.  Her pain is in the left side of her temple and side of head, occasionally on top on the left side.   Today it feels like the pain radiates down to her chest. It is a throbbing pain.  It is constant.  It is not as bad as when she was seen here.  She has had sleepness nights.  She denies burning, numbness/tingling.  She had a massage and saw a Restaurant manager, fast food.  The fioricet, ibuprofen and tension headache medication has not helped.      05/13/2019:  MRI of head: Brain parenchyma with few scattered foci of high T2 flair signal abnormality within the periventricular white matter which are nonspecific but commonly seen with small vessel ischemic changes.  No acute infarct.  No mass or hemorrhage.  Ventricles are normal without dilation.  Mastoids and paranasal sinuses show a left maxillary sinus retention cyst.  Mastoid air cells clear.  Orbits normal.  05/06/2019:  CT of head w/o contrast:  Borderline prominence of the lateral ventricles is stable compared to the 2008 study. Significance of this finding uncertain. Other ventricles and sulci appear normal and stable. The brain parenchyma appears unremarkable. No mass or hemorrhage.  Foci of paranasal sinus disease noted.  There is incomplete visualization of a retention cyst in the inferior left maxillary antrum.   09/29/2006:  No evidence of sinovenous occlusive disease  09/27/2006:   Prominent lateral ventricular size for the patient's age. This may imply increased intracranial pressure. The 3rd and 4th ventricles are not dilated.    She is not exercising regularly.     She is taking all of her medications as prescribed.      Medications and allergies reviewed with patient and updated if appropriate.  Patient Active Problem List   Diagnosis Date Noted  . 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 New daily persistent headache 05/06/2019  . Tension headache 05/06/2019  . Influenza A 06/13/2018  . Acute cystitis with hematuria 06/04/2018  . Ganglion cyst of wrist, left 05/29/2018  . Hx of colonic polyp 09/20/2017  . Anal skin tag 09/13/2017  . Adjustment reaction with anxiety and depression 05/24/2017  . Lateral epicondylitis 05/23/2016  . Diabetes ([place]) 05/23/2016  . Snoring 09/07/2015  . Insomnia 06/02/2009  . Obesity 05/19/2008  . Chronic kidney disease 05/19/2008  . Hyperlipidemia 04/17/2008  . COMMON MIGRAINE 04/17/2008  . Essential hypertension 04/17/2008  . ALLERGIC RHINITIS 04/17/2008    Current Outpatient Medications on File Prior to Visit  Medication Sig Dispense Refill  . ALPRAZolam (XANAX) 0.5 MG tablet Take 1 tablet (0.5 mg total) by mouth 2 (two) times daily as needed for anxiety. 10 tablet 0  . amLODipine (NORVASC) 5 MG tablet Take 1 tablet (5 mg total) by mouth daily. 90 tablet 0  . atorvastatin (LIPITOR) 40 MG tablet TAKE 1 TABLET BY MOUTH DAILY 90 tablet 3  . azelastine (ASTELIN) 0.1 % nasal spray azelastine 137 mcg (0.1 %) nasal spray aerosol    . Butalbital-APAP-Caffeine 50-300-40 MG CAPS Take 1 capsule by mouth every 8 (eight) hours as needed. 35 capsule 0  . Diclofenac Sodium (PENNSAID) 2 % SOLN Pennsaid  20 mg/gram/actuation (2 %) topical soln in metered-dose pump  APPLY ONE PUMP TOPICALLY TO THE AFFECTED AREA TWICE DAILY AS NEEDED    . DOXYCYCLINE MONOHYDRATE PO Take 50 mg by mouth 2 (two) times daily.    . fenofibrate (TRICOR) 145 MG tablet TAKE 1 TABLET BY MOUTH DAILY 90 tablet 1  . labetalol (NORMODYNE) 200 MG tablet TAKE 2 TABLETS BY MOUTH 3 TIMES DAILY GENERIC EQUIVALENT FOR NORMODYNE 540 tablet 1  . Olopatadine HCl (PAZEO) 0.7 % SOLN Pazeo 0.7 % eye drops   INSTILL 1 DROP INTO BOTH EYES ONCE A DAY AS NEEDED    . RESTASIS 0.05 % ophthalmic emulsion INSTILL 1 DROP INTO BOTH EYES TWICE A DAY INSTILL 1 DROP INTO BOTH EYES TWICE DAILY    . sertraline (ZOLOFT) 50 MG tablet TAKE 1 TABLET BY MOUTH EVERYDAY AT BEDTIME 90 tablet 1  . triamterene-hydrochlorothiazide (DYAZIDE) 37.5-25 MG capsule Take 1 each (1 capsule total) by mouth daily. 90 capsule 3   No current facility-administered medications on file prior to visit.    Past Medical History:  Diagnosis Date  . ALLERGIC RHINITIS   . Anal skin tag 09/13/2017  . COMMON MIGRAINE   . GLUCOSE INTOLERANCE   . Hx of colonic polyp 09/20/2017  . Hypercalcemia   . HYPERLIPIDEMIA   . HYPERTENSION   . INSOMNIA   . OBESITY   . RENAL INSUFFICIENCY     Past Surgical History:  Procedure Laterality Date  . BREAST SURGERY  1998   Implants  . hypertension post partum  09/2006   Not precalmpsia    Social History   Socioeconomic History  . Marital status: Married    Spouse name: Not on file  . Number of children: Not on file  . Years of education: Not on file  . Highest education level: Not on file  Occupational History  . Not on file  Tobacco Use  . Smoking status: Former Smoker    Quit date: 05/09/2005    Years since quitting: 14.0  . Smokeless tobacco: Never Used  . Tobacco comment: Married, lives with spouse and son. 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 Alcohol use: Yes    Comment: social  . Drug use: No  . Sexual activity: Not on file  Other Topics Concern  . Not on file  Social History Narrative  . Not on file   Social Determinants of Health   Financial Resource Strain:   . Difficulty of Paying Living Expenses: Not on file  Food Insecurity:   . Worried About Charity fundraiser in the Last Year: Not on file  . Ran Out of Food in the Last Year: Not on file  Transportation Needs:   . Lack of Transportation (Medical): Not  on file  . Lack of Transportation (Non-Medical): Not on file  Physical Activity:   . Days of Exercise per Week: Not on file  . Minutes of Exercise per Session: Not on file  Stress:   . Feeling of Stress : Not on file  Social Connections:   . Frequency of Communication with Friends and Family: Not on file  . Frequency of Social Gatherings with Friends and Family: Not on file  . Attends Religious Services: Not on file  . Active Member of Clubs or Organizations: Not on file  . Attends Archivist Meetings: Not on file  . Marital Status: Not on file  Family History  Problem Relation Age of Onset  . Hypertension Mother   . Other Father        MVA  . Diabetes Maternal Grandfather   . Colon cancer Neg Hx   . Stomach cancer Neg Hx   . Rectal cancer Neg Hx     Review of Systems  Constitutional: Negative for chills and fever.  Respiratory: Negative for cough, shortness of breath and wheezing.   Cardiovascular: Negative for chest pain, palpitations and leg swelling.  Neurological: Positive for headaches. Negative for dizziness, weakness, light-headedness and numbness.       Objective:   Vitals:   05/17/19 0933  BP: (!) 146/106  Pulse: 81  Resp: 16  Temp: 98.3 F (36.8 C)  SpO2: 97%   BP Readings from Last 3 Encounters:  05/17/19 (!) 146/106  05/06/19 (!) 150/98  11/28/18 134/88   Wt Readings from Last 3 Encounters:  05/06/19 214 lb (97.1 kg)  11/28/18 210 lb (95.3 kg)  06/13/18 195 lb (88.5 kg)   Body mass index is 37.91 kg/m.   Physical Exam    Constitutional: Appears well-developed and well-nourished. No distress.  HENT:  Head: Normocephalic and atraumatic.  Neck: Neck supple. No tracheal deviation present. No thyromegaly present.  No cervical lymphadenopathy Cardiovascular: Normal rate, regular rhythm and normal heart sounds.   No murmur heard. No carotid bruit .  No edema Pulmonary/Chest: Effort normal and breath sounds normal. No respiratory  distress. No has no wheezes. No rales. Musculoskeletal: No muscular tightness in neck, slight tenderness along base of skull on left side and she did note some pain in the left side of her head when this was done Skin: Skin is warm and dry. Not diaphoretic.  Psychiatric: Normal mood and affect. Behavior is normal.      Assessment & Plan:    See Problem List for Assessment and Plan of chronic medical problems.    This visit occurred during the SARS-CoV-2 public health emergency.  Safety protocols were in place, including screening questions prior to the visit, additional usage of staff PPE, and extensive cleaning of exam room while observing appropriate contact time as indicated for disinfecting solutions.

## 2019-05-17 ENCOUNTER — Ambulatory Visit (INDEPENDENT_AMBULATORY_CARE_PROVIDER_SITE_OTHER): Payer: BC Managed Care – PPO | Admitting: Internal Medicine

## 2019-05-17 ENCOUNTER — Other Ambulatory Visit: Payer: Self-pay

## 2019-05-17 ENCOUNTER — Encounter: Payer: Self-pay | Admitting: Internal Medicine

## 2019-05-17 VITALS — BP 146/106 | HR 81 | Temp 98.3°F | Resp 16 | Ht 63.0 in

## 2019-05-17 DIAGNOSIS — F4323 Adjustment disorder with mixed anxiety and depressed mood: Secondary | ICD-10-CM

## 2019-05-17 DIAGNOSIS — E119 Type 2 diabetes mellitus without complications: Secondary | ICD-10-CM | POA: Diagnosis not present

## 2019-05-17 DIAGNOSIS — E7849 Other hyperlipidemia: Secondary | ICD-10-CM | POA: Diagnosis not present

## 2019-05-17 DIAGNOSIS — G4486 Cervicogenic headache: Secondary | ICD-10-CM

## 2019-05-17 DIAGNOSIS — I10 Essential (primary) hypertension: Secondary | ICD-10-CM

## 2019-05-17 DIAGNOSIS — R519 Headache, unspecified: Secondary | ICD-10-CM

## 2019-05-17 MED ORDER — METHYLPREDNISOLONE 4 MG PO TBPK: ORAL_TABLET | ORAL | 0 refills | Status: DC

## 2019-05-17 MED ORDER — GABAPENTIN 100 MG PO CAPS: 100.0000 mg | ORAL_CAPSULE | Freq: Every day | ORAL | 3 refills | Status: DC

## 2019-05-17 NOTE — Assessment & Plan Note (Signed)
Chronic-?  Controlled Blood pressure slightly elevated here today, which could be related to head pain She was started on amlodipine recently-continue Continue labetalol at current dose and Dyazide at current dose She will monitor blood pressure at home and let me know what her readings are No changes today

## 2019-05-17 NOTE — Assessment & Plan Note (Signed)
Chronic, stable Continue atorvastatin 40 mg daily She did have some possible evidence of small vessel disease on MRI of head-need to keep LDL well controlled Follow-up in 6 months

## 2019-05-17 NOTE — Assessment & Plan Note (Addendum)
Lab Results  Component Value Date   HGBA1C 6.7 (H) 05/06/2019   Sugars controlled, but are higher than usual When she is able she will get back into regular exercise Stressed diabetic diet Currently controlling diabetes with lifestyle Follow-up in 6 months

## 2019-05-17 NOTE — Assessment & Plan Note (Signed)
Subacute, not currently controlled Reviewed imaging that she had-MRI and CT-nothing concerning seen Headaches likely cervicogenic in nature No relief with Fioricet, ibuprofen-advised to discontinue Start Medrol Dosepak Gabapentin at night-100 mg - 300 mg as tolerated She will update me next week and how her symptoms are

## 2019-05-17 NOTE — Assessment & Plan Note (Signed)
Chronic Controlled, stable Continue current dose of medication-sertraline 50 mg daily  

## 2019-05-18 ENCOUNTER — Encounter: Payer: Self-pay | Admitting: Internal Medicine

## 2019-05-19 ENCOUNTER — Encounter: Payer: Self-pay | Admitting: Internal Medicine

## 2019-05-19 DIAGNOSIS — G4452 New daily persistent headache (NDPH): Secondary | ICD-10-CM

## 2019-05-19 DIAGNOSIS — J341 Cyst and mucocele of nose and nasal sinus: Secondary | ICD-10-CM

## 2019-05-23 NOTE — Addendum Note (Signed)
Addended by: Binnie Rail on: 05/23/2019 02:54 PM   Modules accepted: Orders

## 2019-05-25 ENCOUNTER — Other Ambulatory Visit: Payer: BC Managed Care – PPO

## 2019-05-28 ENCOUNTER — Encounter: Payer: Self-pay | Admitting: Internal Medicine

## 2019-05-28 ENCOUNTER — Encounter: Payer: Self-pay | Admitting: Neurology

## 2019-05-28 ENCOUNTER — Ambulatory Visit: Payer: BC Managed Care – PPO | Admitting: Neurology

## 2019-05-28 ENCOUNTER — Other Ambulatory Visit: Payer: Self-pay

## 2019-05-28 VITALS — BP 119/87 | HR 80 | Temp 96.8°F | Ht 64.0 in | Wt 219.0 lb

## 2019-05-28 DIAGNOSIS — G43011 Migraine without aura, intractable, with status migrainosus: Secondary | ICD-10-CM | POA: Diagnosis not present

## 2019-05-28 DIAGNOSIS — R519 Headache, unspecified: Secondary | ICD-10-CM | POA: Diagnosis not present

## 2019-05-28 DIAGNOSIS — G43001 Migraine without aura, not intractable, with status migrainosus: Secondary | ICD-10-CM | POA: Diagnosis not present

## 2019-05-28 NOTE — Progress Notes (Signed)
Nerve block w/o steroid: Pt signed consent Physician prepared  0.5% Bupivocaine 4.5 mL LOT: [id number] EXP: 03/2020 NDC: 55150-50-50  2% Lidocaine 4.5 mL LOT: [id number] EXP: 11/07/2019 NDC: [id number]  Infusion orders written, signed by MD and given to Intrafusion nurse: Depacon 1 gram IV x 1 Toradol 30 mg IV x 1  Solumedrol 250 mg IV x 1  //BCrn

## 2019-05-28 NOTE — Patient Instructions (Signed)

## 2019-05-28 NOTE — Progress Notes (Signed)
[id number]    Provider:  Dr Jaynee Eagles Requesting Provider: Binnie Rail, MD Primary Care Provider:  Binnie Rail, MD  CC:  Intractable headache  HPI:  [name] is a 54 y.o. female here as requested by Binnie Rail, MD for headaches. PMHx remote hx of migraines, diabetes, HTN, anxiety with depression.  I reviewed Dr. Quay Burow notes, diagnosis cervicogenic headache, subacute, not controlled, she has had MRI and CT with nothing concerning seen, headaches likely cervicogenic in nature, no relief with Fioricet, ibuprofen, advised to discontinue, Medrol Dosepak was given, also gabapentin at night 100 mg to 300 mg as tolerated.  She was last seen May 17, 2019 and referral was placed the 14th so I assume that patient not feeling better with this plan and referred to neurology. Headache started December 7th, she remembers when it started when she got back from [place], no known inciting events, she thought it was tension she noticed pain on the left side of the head and radiates into the neck just on the left, pounding, sound sensitivity, no nausea, she had a massage, she tried excedrin migraine, ibuprofen, tried everything, say her pcp and tried fioricet, gabapentin, she went to the chiropractor and neck adjustment. She feels the main center of pain is in the left side of the head and not in the Walker but it radiates to the neck. She was started on amlodipine, CT of the head was negative. She does have some sinus presure but the pain is left temple. No jaw pain, no vision changes, steroids did not help. She had an MRI reportedly unremarkable (I do not have the images or CD she had them done at [facility]). She is having severe pain, hurts to bend down, it is there constantly 24x7, She is in pain, it is continuous, pain is 10/10, computer use makes it worse.   Reviewed notes, labs and imaging from outside physicians, which showed:  CT head: personally reviewed images and agree with  the following: IMPRESSION: Borderline prominence of the lateral ventricles is stable compared to the 2008 study. Significance of this finding uncertain. Other ventricles and sulci appear normal and stable. The brain parenchyma appears unremarkable. No mass or hemorrhage.  Foci of paranasal sinus disease noted.personally reviewed imaging  Review of Systems: Patient complains of symptoms per HPI as well as the following symptoms: headache. Pertinent negatives and positives per HPI. All others negative.   Social History   Socioeconomic History  . Marital status: Married    Spouse name: Johnathon  . Number of children: 1  . Years of education: 48  . Highest education level: Not on file  Occupational History  . Occupation: [employer]  Tobacco Use  . Smoking status: Former Smoker    Quit date: 05/09/2005    Years since quitting: 14.0  . Smokeless tobacco: Never Used  . Tobacco comment: Married, lives with spouse and son. 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 Alcohol use: Yes    Comment: social  . Drug use: No  . Sexual activity: Not on file  Other Topics Concern  . Not on file  Social History Narrative   Right handed    Coffee daily   Lives with husband and son. She also takes care of her mother who has Alzheimers   Social Determinants of Health   Financial Resource Strain:   . Difficulty of Paying Living Expenses: Not on file  Food Insecurity:   . Worried About Charity fundraiser in the Last Year: Not on file  . Ran Out of Food in the Last Year: Not on file  Transportation Needs:   . Lack of Transportation (Medical): Not on file  . Lack of Transportation (Non-Medical): Not on file  Physical Activity:   . Days of Exercise per Week: Not on file  . Minutes of Exercise per Session: Not on file  Stress:   . Feeling of Stress : Not on file  Social Connections:   . Frequency of Communication with Friends and  Family: Not on file  . Frequency of Social Gatherings with Friends and Family: Not on file  . Attends Religious Services: Not on file  . Active Member of Clubs or Organizations: Not on file  . Attends Archivist Meetings: Not on file  . Marital Status: Not on file  Intimate Partner Violence:   . Fear of Current or Ex-Partner: Not on file  . Emotionally Abused: Not on file  . Physically Abused: Not on file  . Sexually Abused: Not on file    Family History  Problem Relation Age of Onset  . Hypertension Mother   . Other Father        MVA  . Diabetes Maternal Grandfather   . Colon cancer Neg Hx   . Stomach cancer Neg Hx   . Rectal cancer Neg Hx     Past Medical History:  Diagnosis Date  . ALLERGIC RHINITIS   . Anal skin tag 09/13/2017  . COMMON MIGRAINE   . GLUCOSE INTOLERANCE   . Hx of colonic polyp 09/20/2017  . Hypercalcemia   . HYPERLIPIDEMIA   . HYPERTENSION   . INSOMNIA   . OBESITY   . RENAL INSUFFICIENCY     Patient Active Problem List   Diagnosis Date Noted  . Cervicogenic headache 05/17/2019  . 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 New daily persistent headache 05/06/2019  . Tension headache 05/06/2019  . Acute cystitis with hematuria 06/04/2018  . Ganglion cyst of wrist, left 05/29/2018  . Hx of colonic polyp 09/20/2017  . Anal skin tag 09/13/2017  . Adjustment reaction with anxiety and depression 05/24/2017  . Lateral epicondylitis 05/23/2016  . Diabetes ([place]) 05/23/2016  . Snoring 09/07/2015  . Insomnia 06/02/2009  . Obesity 05/19/2008  . Chronic kidney disease 05/19/2008  . Hyperlipidemia 04/17/2008  . Intractable migraine without aura and with status migrainosus 04/17/2008  . Essential hypertension 04/17/2008  . ALLERGIC RHINITIS 04/17/2008    Past Surgical History:  Procedure Laterality Date  . BREAST SURGERY  1998   Implants  . hypertension post partum  09/2006   Not precalmpsia    Current Outpatient Medications    Medication Sig Dispense Refill  . amLODipine (NORVASC) 5 MG tablet Take 1 tablet (5 mg total) by mouth daily. 90 tablet 0  . atorvastatin (LIPITOR) 40 MG tablet TAKE 1 TABLET BY MOUTH DAILY 90 tablet 3  . azelastine (ASTELIN) 0.1 % nasal spray azelastine 137 mcg (0.1 %) nasal spray aerosol    . Diclofenac Sodium (PENNSAID) 2 % SOLN Pennsaid 20 mg/gram/actuation (2 %) topical soln in metered-dose pump  APPLY ONE PUMP TOPICALLY TO THE AFFECTED AREA TWICE DAILY AS NEEDED    . DOXYCYCLINE MONOHYDRATE PO Take 50 mg by mouth 2 (two) times daily.    . fenofibrate (TRICOR) 145 MG tablet TAKE 1 TABLET BY MOUTH DAILY 90 tablet 1  . labetalol (NORMODYNE)  200 MG tablet TAKE 2 TABLETS BY MOUTH 3 TIMES DAILY GENERIC EQUIVALENT FOR NORMODYNE 540 tablet 1  . Olopatadine HCl (PAZEO) 0.7 % SOLN Pazeo 0.7 % eye drops  INSTILL 1 DROP INTO BOTH EYES ONCE A DAY AS NEEDED    . RESTASIS 0.05 % ophthalmic emulsion INSTILL 1 DROP INTO BOTH EYES TWICE A DAY INSTILL 1 DROP INTO BOTH EYES TWICE DAILY    . sertraline (ZOLOFT) 50 MG tablet TAKE 1 TABLET BY MOUTH EVERYDAY AT BEDTIME 90 tablet 1  . triamterene-hydrochlorothiazide (DYAZIDE) 37.5-25 MG capsule Take 1 each (1 capsule total) by mouth daily. 90 capsule 3   No current facility-administered medications for this visit.    Allergies as of 05/28/2019 - Review Complete 05/28/2019  Allergen Reaction Noted  . Lisinopril  09/02/2008  . Olmesartan medoxomil  09/16/2008    Vitals: BP 119/87 (BP Location: Left Arm, Patient Position: Sitting, Cuff Size: Normal)   Pulse 80   Temp (!) 96.8 F (36 C)   Ht 5\' 4"  (1.626 m)   Wt 219 lb (99.3 kg)   BMI 37.59 kg/m  Last Weight:  Wt Readings from Last 1 Encounters:  05/28/19 219 lb (99.3 kg)   Last Height:   Ht Readings from Last 1 Encounters:  05/28/19 5\' 4"  (1.626 m)     Physical exam: Exam: Gen: NAD, conversant, well nourised, obese, well groomed                     CV: RRR, no MRG. No Carotid Bruits. No  peripheral edema, warm, nontender Eyes: Conjunctivae clear without exudates or hemorrhage  Neuro: Detailed Neurologic Exam  Speech:    Speech is normal; fluent and spontaneous with normal comprehension.  Cognition:    The patient is oriented to person, place, and time;     recent and remote memory intact;     language fluent;     normal attention, concentration,     fund of knowledge Cranial Nerves:    The pupils are equal, round, and reactive to light. The fundi are normal and spontaneous venous pulsations are present. Visual fields are full to finger confrontation. Extraocular movements are intact. Trigeminal sensation is intact and the muscles of mastication are normal. The face is symmetric. The palate elevates in the midline. Hearing intact. Voice is normal. Shoulder shrug is normal. The tongue has normal motion without fasciculations.   Coordination:    Normal finger to nose and heel to shin. Normal rapid alternating movements.   Gait:    Heel-toe and tandem gait are normal.   Motor Observation:    No asymmetry, no atrophy, and no involuntary movements noted. Tone:    Normal muscle tone.    Posture:    Posture is normal. normal erect    Strength:    Strength is V/V in the upper and lower limbs.      Sensation: intact to LT     Reflex Exam:  DTR's:    Deep tendon reflexes in the upper and lower extremities are normal bilaterally.   Toes:    The toes are downgoing bilaterally.   Clonus:    Clonus is absent.    Assessment/Plan:  Patient with new onset intractable headache. She has a remote history of migraines and now going through menopause which could be triggering migraines again. Pain left temporal, pounding, light and sound sensitivity, radiating to the left cervical muscles. Migraine cocktail and nerve blocks today with great improvement  Performed by Dr. Jaynee Eagles M.D. 30-gauge needle was used. All procedures a documented blood were medically necessary,  reasonable and appropriate based on the patient's history, medical diagnosis and physician opinion. Verbal informed consent was obtained from the patient, patient was informed of potential risk of procedure, including bruising, bleeding, hematoma formation, infection, muscle weakness, muscle pain, numbness, transient hypertension, transient hyperglycemia and transient insomnia among others. All areas injected were topically clean with isopropyl rubbing alcohol. Nonsterile nonlatex gloves were worn during the procedure.  1. Greater occipital nerve block [phone]). The greater occipital nerve site was identified at the nuchal line medial to the occipital artery. Medication was injected into the left occipital nerve areas and suboccipital areas. Patient's condition is associated with inflammation of the greater occipital nerve and associated multiple groups. Injection was deemed medically necessary, reasonable and appropriate. Injection represents a separate and unique surgical service.  2. Supraorbital nerve block ([id number]): Supraorbital nerve site was identified along the incision of the frontal bone on the orbital/supraorbital ridge. Medication was injected into the left supraorbital nerve areas. Patient's condition is associated with inflammation of the supraorbital and associated muscle groups. Injection was deemed medically necessary, reasonable and appropriate. Injection represents a separate and unique surgical service.     Orders Placed This Encounter  Procedures  . Sedimentation rate  . C-reactive protein   A total of 80  minutes was spent on this patient's care, reviewing imaging, past records, recent hospitalization notes and results. Over half this time was spent on counseling patient on the  1. Intractable headache, unspecified chronicity pattern, unspecified headache type   2. Left temporal headache   3. Intractable migraine without aura and with status migrainosus    diagnosis and  different diagnostic and therapeutic options, counseling and coordination of care, risks and benefitsof management, compliance, or risk factor reduction and education.  This does not include time spent on nerve nlocks.    Cc: Binnie Rail, MD,    Sarina Ill, MD  [facility] [address] [place], [place] [address]  Phone [phone] Fax [phone]

## 2019-05-29 LAB — SEDIMENTATION RATE: Sed Rate: 6 mm/hr (ref 0–40)

## 2019-05-29 LAB — C-REACTIVE PROTEIN: CRP: 2 mg/L (ref 0–10)

## 2019-05-30 ENCOUNTER — Encounter: Payer: Self-pay | Admitting: Internal Medicine

## 2019-05-30 MED ORDER — HYDROXYZINE HCL 25 MG PO TABS: 25.0000 mg | ORAL_TABLET | Freq: Every evening | ORAL | 0 refills | Status: DC | PRN

## 2019-05-31 ENCOUNTER — Encounter: Payer: BC Managed Care – PPO | Admitting: Internal Medicine

## 2019-06-06 ENCOUNTER — Other Ambulatory Visit: Payer: Self-pay | Admitting: Internal Medicine

## 2019-06-11 ENCOUNTER — Other Ambulatory Visit: Payer: Self-pay | Admitting: Internal Medicine

## 2019-06-11 ENCOUNTER — Other Ambulatory Visit: Payer: Self-pay | Admitting: Otolaryngology

## 2019-06-11 ENCOUNTER — Ambulatory Visit
Admission: RE | Admit: 2019-06-11 | Discharge: 2019-06-11 | Disposition: A | Discharge status: Home / Self Care | Payer: BC Managed Care – PPO | Source: Ambulatory Visit | Attending: Otolaryngology | Admitting: Otolaryngology

## 2019-06-11 DIAGNOSIS — J310 Chronic rhinitis: Secondary | ICD-10-CM | POA: Diagnosis not present

## 2019-06-11 DIAGNOSIS — E041 Nontoxic single thyroid nodule: Secondary | ICD-10-CM

## 2019-06-11 DIAGNOSIS — J342 Deviated nasal septum: Secondary | ICD-10-CM | POA: Insufficient documentation

## 2019-06-11 DIAGNOSIS — J341 Cyst and mucocele of nose and nasal sinus: Secondary | ICD-10-CM | POA: Diagnosis not present

## 2019-06-11 DIAGNOSIS — E042 Nontoxic multinodular goiter: Secondary | ICD-10-CM | POA: Diagnosis not present

## 2019-06-15 ENCOUNTER — Other Ambulatory Visit: Payer: Self-pay | Admitting: Internal Medicine

## 2019-06-18 ENCOUNTER — Other Ambulatory Visit: Payer: Self-pay | Admitting: Otolaryngology

## 2019-06-18 DIAGNOSIS — E041 Nontoxic single thyroid nodule: Secondary | ICD-10-CM

## 2019-06-19 ENCOUNTER — Other Ambulatory Visit: Payer: Self-pay | Admitting: Otolaryngology

## 2019-06-19 DIAGNOSIS — E041 Nontoxic single thyroid nodule: Secondary | ICD-10-CM

## 2019-06-20 ENCOUNTER — Other Ambulatory Visit: Payer: Self-pay | Admitting: *Deleted

## 2019-06-20 ENCOUNTER — Encounter: Payer: Self-pay | Admitting: *Deleted

## 2019-06-20 ENCOUNTER — Telehealth: Payer: Self-pay | Admitting: Neurology

## 2019-06-20 ENCOUNTER — Telehealth: Payer: Self-pay | Admitting: *Deleted

## 2019-06-20 MED ORDER — NURTEC 75 MG PO TBDP: 75.0000 mg | ORAL_TABLET | Freq: Every day | ORAL | 10 refills | Status: DC | PRN

## 2019-06-20 MED ORDER — AJOVY 225 MG/1.5ML ~~LOC~~ SOAJ: 225.0000 mg | SUBCUTANEOUS | 11 refills | Status: DC

## 2019-06-20 NOTE — Telephone Encounter (Signed)
Patient was seen on 05/28/2019 she states her headaches havent improved she is taking about 6-8 tablets of ibuprofen in the morning and at night. She wants to know what can be done to help her. She states the headaches are unbearable, she went to the ENT about the cyst that showed up on her MRI and she stated they advised her it wasn't related to her headaches

## 2019-06-20 NOTE — Telephone Encounter (Signed)
I spoke with the patient. She would like to try the Nurtec and the Ajovy. I discussed the instructions for each (Ajovy injection into the skin every 30 days and Nurtec max of 1 tablet in a day for abortive therapy) and I advised the Ajovy would include detailed visual instructions with each pen. Pt aware to refrigerate the pen until use, then let it sit out at room temp for 30 minutes prior to injection. We also discussed injection techniques and site prep/hand hygiene prior to injection. I let her know about the savings cards and that I would send her information through mychart about both the medications and the savings programs. Pt understands the most common s/e of Ajovy is an injection site reaction.   Per Dr. Cathren Laine v.o., I sent Ajovy 225 mg & Nurtec 75 mg prescriptions to pt's pharmacy. Drug information for both sent to pt in mychart along with website links for savings cards.

## 2019-06-20 NOTE — Telephone Encounter (Signed)
Completed Nurtec PA on CMM. Key: [id number]. Awaiting [employer] determination. I do not think this will get approved d/t requirements of tried/failed triptans. However the patient should still e able to use the savings card.

## 2019-06-20 NOTE — Telephone Encounter (Signed)
Yes, lets try Ajovy if she is willing, let me know if sh ehas any questions, I did give her literature and spoke to her about it. That is the very fastest medication we have. We can also give her nurtec that she cn take daily to try and get her through. Looks like she has Pharmacist, community.

## 2019-06-20 NOTE — Telephone Encounter (Signed)
Completed Ajovy PA on CMM. Key: [id number]. Awaiting determination from [place] [facility] [place]. Regardless of outcome, pt should be able to use the savings card to get this for $5/month.

## 2019-06-27 NOTE — Telephone Encounter (Signed)
Per Cover My Meds, Ajovy denied. I called [facility] and was told the pt had not brought the savings card by yet. I called the pt and LVM (ok per DPR) letting her know Ajovy was denied. I encouraged her to take the savings card to the pharmacy and let her know she can download the card from [email or web address]

## 2019-06-27 NOTE — Telephone Encounter (Signed)
Decision received through covermymeds. Medication denied.

## 2019-07-02 NOTE — Telephone Encounter (Signed)
Received denial letter from [facility]. Ajovy denied for not meeting requirements. Emgality and Aimovig required first. Prevention of chronic and episodic migraine. Tried/failed medications: beta blockers, antidepressants, anticonvulsants.   If we should choose to appeal, fax within 180 days to [phone]. See below, called pt and told her to use savings card.

## 2019-07-23 ENCOUNTER — Other Ambulatory Visit: Payer: Self-pay | Admitting: Internal Medicine

## 2019-07-23 DIAGNOSIS — I10 Essential (primary) hypertension: Secondary | ICD-10-CM

## 2019-07-23 MED ORDER — AMLODIPINE BESYLATE 5 MG PO TABS: 5.0000 mg | ORAL_TABLET | Freq: Every day | ORAL | 0 refills | Status: DC

## 2019-07-25 DIAGNOSIS — Z0279 Encounter for issue of other medical certificate: Secondary | ICD-10-CM

## 2019-08-06 ENCOUNTER — Telehealth: Payer: Self-pay | Admitting: Internal Medicine

## 2019-08-06 MED ORDER — FENOFIBRATE 145 MG PO TABS: 145.0000 mg | ORAL_TABLET | Freq: Every day | ORAL | 1 refills | Status: DC

## 2019-08-06 NOTE — Telephone Encounter (Signed)
Rx sent 

## 2019-08-06 NOTE — Telephone Encounter (Signed)
Anna with [facility] calling and states that the refill is needed. Advised that a message is already out to the provider for approval. States that the rx can either be faxed or called in. Information below. Phone#: [phone] Fax#: [phone]

## 2019-08-06 NOTE — Telephone Encounter (Signed)
    Patient has no medication remaining   1.Medication Requested:fenofibrate (TRICOR) 145 MG tablet  2. Pharmacy (Name, Street, [place]): [facility] [place] [place]  3. On Med List: yes  4. Last Visit with PCP: 05/17/19  5. Next visit date with PCP: 11/14/19   Agent: Please be advised that RX refills may take up to 3 business days. We ask that you follow-up with your pharmacy.

## 2019-09-14 NOTE — Progress Notes (Signed)
Subjective:    Patient ID: [name], female    DOB: 21-May-1965, 54 y.o.   MRN: [id number]  HPI The patient is here for an acute visit.   Skin rash:  She has a rash around her ankles.  She noticed it around February she thinks.  She was in [place] when she noticed it.  It is itchy.  She tried steroid cream from a friend.  ? Desonide.  It has spread a little.     Medications and allergies reviewed with patient and updated if appropriate.  Patient Active Problem List   Diagnosis Date Noted  . Cervicogenic headache 05/17/2019  . 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 New daily persistent headache 05/06/2019  . Tension headache 05/06/2019  . Acute cystitis with hematuria 06/04/2018  . Ganglion cyst of wrist, left 05/29/2018  . Hx of colonic polyp 09/20/2017  . Anal skin tag 09/13/2017  . Adjustment reaction with anxiety and depression 05/24/2017  . Lateral epicondylitis 05/23/2016  . Diabetes ([place]) 05/23/2016  . Snoring 09/07/2015  . Insomnia 06/02/2009  . Obesity 05/19/2008  . Chronic kidney disease 05/19/2008  . Hyperlipidemia 04/17/2008  . Intractable migraine without aura and with status migrainosus 04/17/2008  . Essential hypertension 04/17/2008  . ALLERGIC RHINITIS 04/17/2008    Current Outpatient Medications on File Prior to Visit  Medication Sig Dispense Refill  . amLODipine (NORVASC) 5 MG tablet Take 1 tablet (5 mg total) by mouth daily. 90 tablet 0  . atorvastatin (LIPITOR) 40 MG tablet TAKE 1 TABLET BY MOUTH DAILY 90 tablet 3  . azelastine (ASTELIN) 0.1 % nasal spray azelastine 137 mcg (0.1 %) nasal spray aerosol    . Diclofenac Sodium (PENNSAID) 2 % SOLN Pennsaid 20 mg/gram/actuation (2 %) topical soln in metered-dose pump  APPLY ONE PUMP TOPICALLY TO THE AFFECTED AREA TWICE DAILY AS NEEDED    . DOXYCYCLINE MONOHYDRATE PO Take 50 mg by mouth 2 (two) times daily.    . fenofibrate (TRICOR) 145 MG tablet Take 1 tablet (145 mg total) by mouth  daily. 90 tablet 1  . Fremanezumab-vfrm (AJOVY) 225 MG/1.5ML SOAJ Inject 225 mg into the skin every 30 (thirty) days. 1.5 mL 11  . hydrOXYzine (ATARAX/VISTARIL) 25 MG tablet TAKE 1-2 TABLETS (25-50 MG TOTAL) BY MOUTH AT BEDTIME AS NEEDED (FOR SLEEP). 180 tablet 1  . labetalol (NORMODYNE) 200 MG tablet TAKE 2 TABLETS BY MOUTH 3 TIMES DAILY GENERIC EQUIVALENT FOR NORMODYNE 540 tablet 1  . Olopatadine HCl (PAZEO) 0.7 % SOLN Pazeo 0.7 % eye drops  INSTILL 1 DROP INTO BOTH EYES ONCE A DAY AS NEEDED    . RESTASIS 0.05 % ophthalmic emulsion INSTILL 1 DROP INTO BOTH EYES TWICE A DAY INSTILL 1 DROP INTO BOTH EYES TWICE DAILY    . Rimegepant Sulfate (NURTEC) 75 MG TBDP Take 75 mg by mouth daily as needed (MIGRAINE). Max of 1 tablet per day. 10 tablet 10  . sertraline (ZOLOFT) 50 MG tablet TAKE 1 TABLET BY MOUTH EVERYDAY AT BEDTIME 90 tablet 1  . triamterene-hydrochlorothiazide (DYAZIDE) 37.5-25 MG capsule Take 1 each (1 capsule total) by mouth daily. 90 capsule 3   No current facility-administered medications on file prior to visit.    Past Medical History:  Diagnosis Date  . ALLERGIC RHINITIS   . Anal skin tag 09/13/2017  . COMMON MIGRAINE   . GLUCOSE INTOLERANCE   . Hx of colonic polyp 09/20/2017  . Hypercalcemia   . HYPERLIPIDEMIA   .  HYPERTENSION   . INSOMNIA   . OBESITY   . RENAL INSUFFICIENCY     Past Surgical History:  Procedure Laterality Date  . BREAST SURGERY  1998   Implants  . hypertension post partum  09/2006   Not precalmpsia    Social History   Socioeconomic History  . Marital status: Married    Spouse name: Johnathon  . Number of children: 1  . Years of education: 15  . Highest education level: Not on file  Occupational History  . Occupation: [employer]  Tobacco Use  . Smoking status: Former Smoker    Quit date: 05/09/2005    Years since quitting: 14.3  . Smokeless tobacco: Never Used  . Tobacco comment: Married, lives with spouse and son. 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 Alcohol use: Yes    Comment: social  . Drug use: No  . Sexual activity: Not on file  Other Topics Concern  . Not on file  Social History Narrative   Right handed    Coffee daily   Lives with husband and son. She also takes care of her mother who has Alzheimers   Social Determinants of Health   Financial Resource Strain:   . Difficulty of Paying Living Expenses:   Food Insecurity:   . Worried About Charity fundraiser in the Last Year:   . Arboriculturist in the Last Year:   Transportation Needs:   . Film/video editor (Medical):   Marland Kitchen Lack of Transportation (Non-Medical):   Physical Activity:   . Days of Exercise per Week:   . Minutes of Exercise per Session:   Stress:   . Feeling of Stress :   Social Connections:   . Frequency of Communication with Friends and Family:   . Frequency of Social Gatherings with Friends and Family:   . Attends Religious Services:   . Active Member of Clubs or Organizations:   . Attends Archivist Meetings:   Marland Kitchen Marital Status:     Family History  Problem Relation Age of Onset  . Hypertension Mother   . Other Father        MVA  . Diabetes Maternal Grandfather   . Colon cancer Neg Hx   . Stomach cancer Neg Hx   . Rectal cancer Neg Hx     Review of Systems     Objective:   Vitals:   09/16/19 0802  BP: 118/76  Pulse: 90  Resp: 16  SpO2: 96%   BP Readings from Last 3 Encounters:  09/16/19 118/76  05/28/19 119/87  05/17/19 (!) 146/106   Wt Readings from Last 3 Encounters:  05/28/19 219 lb (99.3 kg)  05/06/19 214 lb (97.1 kg)  11/28/18 210 lb (95.3 kg)   Body mass index is 37.59 kg/m.   Physical Exam Constitutional:      General: She is not in acute distress.    Appearance: Normal appearance. She is not ill-appearing.  Skin:    General: Skin is warm and dry.     Findings: Rash (right posterior ankle - erythematous rash with irregular  rash the size of a tangerine darker edges, papules in center with patchy erythema.  similar rash on on dorsal right foot and left foot) present.  Neurological:     Mental Status: She is alert.            Assessment & Plan:  See Problem List for Assessment and Plan of chronic medical problems.    This visit occurred during the SARS-CoV-2 public health emergency.  Safety protocols were in place, including screening questions prior to the visit, additional usage of staff PPE, and extensive cleaning of exam room while observing appropriate contact time as indicated for disinfecting solutions.

## 2019-09-16 ENCOUNTER — Telehealth: Payer: Self-pay | Admitting: *Deleted

## 2019-09-16 ENCOUNTER — Ambulatory Visit: Payer: BC Managed Care – PPO | Admitting: Neurology

## 2019-09-16 ENCOUNTER — Ambulatory Visit: Payer: BC Managed Care – PPO | Admitting: Internal Medicine

## 2019-09-16 ENCOUNTER — Encounter: Payer: Self-pay | Admitting: Internal Medicine

## 2019-09-16 ENCOUNTER — Other Ambulatory Visit: Payer: Self-pay

## 2019-09-16 DIAGNOSIS — B354 Tinea corporis: Secondary | ICD-10-CM | POA: Diagnosis not present

## 2019-09-16 MED ORDER — CICLOPIROX OLAMINE 0.77 % EX CREA: TOPICAL_CREAM | Freq: Two times a day (BID) | CUTANEOUS | 1 refills | Status: DC

## 2019-09-16 NOTE — Patient Instructions (Signed)
Use the cream twice a day for 4 weeks.    Body Ringworm Body ringworm is an infection of the skin that often causes a ring-shaped rash. Body ringworm is also called tinea corporis. Body ringworm can affect any part of your skin. This condition is easily spread from person to person (is very contagious). What are the causes? This condition is caused by fungi called dermatophytes. The condition develops when these fungi grow out of control on the skin. You can get this condition if you touch a person or animal that has it. You can also get it if you share any items with an infected person or pet. These include:  Clothing, bedding, and towels.  Brushes or combs.  Gym equipment.  Any other object that has the fungus on it. What increases the risk? You are more likely to develop this condition if you:  Play sports that involve close physical contact, such as wrestling.  Sweat a lot.  Live in areas that are hot and humid.  Use public showers.  Have a weakened immune system. What are the signs or symptoms? Symptoms of this condition include:  Itchy, raised red spots and bumps.  Red scaly patches.  A ring-shaped rash. The rash may have: ? A clear center. ? Scales or red bumps at its center. ? Redness near its borders. ? Dry and scaly skin on or around it. How is this diagnosed? This condition can usually be diagnosed with a skin exam. A skin scraping may be taken from the affected area and examined under a microscope to see if the fungus is present. How is this treated? This condition may be treated with:  An antifungal cream or ointment.  An antifungal shampoo.  Antifungal medicines. These may be prescribed if your ringworm: ? Is severe. ? Keeps coming back. ? Lasts a long time. Follow these instructions at home:  Take over-the-counter and prescription medicines only as told by your health care provider.  If you were given an antifungal cream or ointment: ? Use it  as told by your health care provider. ? Wash the infected area and dry it completely before applying the cream or ointment.  If you were given an antifungal shampoo: ? Use it as told by your health care provider. ? Leave the shampoo on your body for 3-5 minutes before rinsing.  While you have a rash: ? Wear loose clothing to stop clothes from rubbing and irritating it. ? Wash or change your bed sheets every night. ? Disinfect or throw out items that may be infected. ? Wash clothes and bed sheets in hot water. ? Wash your hands often with soap and water. If soap and water are not available, use hand sanitizer.  If your pet has the same infection, take your pet to see a veterinarian for treatment. How is this prevented?  Take a bath or shower every day and after every time you work out or play sports.  Dry your skin completely after bathing.  Wear sandals or shoes in public places and showers.  Change your clothes every day.  Wash athletic clothes after each use.  Do not share personal items with others.  Avoid touching red patches of skin on other people.  Avoid touching pets that have bald spots.  If you touch an animal that has a bald spot, wash your hands. Contact a health care provider if:  Your rash continues to spread after 7 days of treatment.  Your rash is not gone  in 4 weeks.  The area around your rash gets red, warm, tender, and swollen. Summary  Body ringworm is an infection of the skin that often causes a ring-shaped rash.  This condition is easily spread from person to person (is very contagious).  This condition may be treated with antifungal cream or ointment, antifungal shampoo, or antifungal medicines.  Take over-the-counter and prescription medicines only as told by your health care provider. This information is not intended to replace advice given to you by your health care provider. Make sure you discuss any questions you have with your health care  provider. Document Revised: 12/22/2017 Document Reviewed: 12/22/2017 Elsevier Patient Education  [place].

## 2019-09-16 NOTE — Telephone Encounter (Signed)
Spoke with pharmacy staff to see if I could help with the Nurtec savings card. I was told the card pt had on file only said "allowable usage 1". Curious if patient received a 2020 card that let her use it once this year. I will see if pt can download a new offer for 2021.

## 2019-09-16 NOTE — Assessment & Plan Note (Signed)
Acute Rash is likely tinea  Start ciclopirox BID x 4 weeks Call if no improvement

## 2019-09-16 NOTE — Telephone Encounter (Signed)
I called the pt & LVM advising Dr. Jaynee Eagles out of the office unexpectedly. I let the pt know we would be calling back asap to reschedule her appointment. Left office number in message if needed.

## 2019-09-16 NOTE — Telephone Encounter (Signed)
Spoke with pt. R/s her for Thurs 5/27 @ 730 AM arrival 700. Pt stated she feels the Ajovy may be helping a little bit only. She still gets headaches and feels they are stemming from her neck or shoulder. She takes Ibuprofen prn. She said the Nurtec helps but doesn't take the headache away. She wasn't able to get it this time because of insurance. I let pt know the savings card should still work. She verbalized appreciation.

## 2019-10-02 NOTE — Progress Notes (Deleted)
[id number]    Provider:  Dr Jaynee Eagles Requesting Provider: Binnie Rail, MD Primary Care Provider:  Binnie Rail, MD  CC:  Intractable headache  HPI:  [name] is a 54 y.o. female here as requested by Binnie Rail, MD for headaches. PMHx remote hx of migraines, diabetes, HTN, anxiety with depression.  I reviewed Dr. Quay Burow notes, diagnosis cervicogenic headache, subacute, not controlled, she has had MRI and CT with nothing concerning seen, headaches likely cervicogenic in nature, no relief with Fioricet, ibuprofen, advised to discontinue, Medrol Dosepak was given, also gabapentin at night 100 mg to 300 mg as tolerated.  She was last seen May 17, 2019 and referral was placed the 14th so I assume that patient not feeling better with this plan and referred to neurology. Headache started December 7th, she remembers when it started when she got back from [place], no known inciting events, she thought it was tension she noticed pain on the left side of the head and radiates into the neck just on the left, pounding, sound sensitivity, no nausea, she had a massage, she tried excedrin migraine, ibuprofen, tried everything, say her pcp and tried fioricet, gabapentin, she went to the chiropractor and neck adjustment. She feels the main center of pain is in the left side of the head and not in the [place] but it radiates to the neck. She was started on amlodipine, CT of the head was negative. She does have some sinus presure but the pain is left temple. No jaw pain, no vision changes, steroids did not help. She had an MRI reportedly unremarkable (I do not have the images or CD she had them done at [facility]). She is having severe pain, hurts to bend down, it is there constantly 24x7, She is in pain, it is continuous, pain is 10/10, computer use makes it worse.   Reviewed notes, labs and imaging from outside physicians, which showed:  CT head: personally reviewed images and agree with  the following: IMPRESSION: Borderline prominence of the lateral ventricles is stable compared to the 2008 study. Significance of this finding uncertain. Other ventricles and sulci appear normal and stable. The brain parenchyma appears unremarkable. No mass or hemorrhage.  Foci of paranasal sinus disease noted.personally reviewed imaging  Review of Systems: Patient complains of symptoms per HPI as well as the following symptoms: headache. Pertinent negatives and positives per HPI. All others negative.   Social History   Socioeconomic History  . Marital status: Married    Spouse name: Johnathon  . Number of children: 1  . Years of education: 48  . Highest education level: Not on file  Occupational History  . Occupation: [employer]  Tobacco Use  . Smoking status: Former Smoker    Quit date: 05/09/2005    Years since quitting: 14.4  . Smokeless tobacco: Never Used  . Tobacco comment: Married, lives with spouse and son. 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 Alcohol use: Yes    Comment: social  . Drug use: No  . Sexual activity: Not on file  Other Topics Concern  . Not on file  Social History Narrative   Right handed    Coffee daily   Lives with husband and son. She also takes care of her mother who has Alzheimers   Social Determinants of Health   Financial Resource Strain:   . Difficulty of Paying Living Expenses:   Food  Insecurity:   . Worried About Charity fundraiser in the Last Year:   . Arboriculturist in the Last Year:   Transportation Needs:   . Film/video editor (Medical):   Marland Kitchen Lack of Transportation (Non-Medical):   Physical Activity:   . Days of Exercise per Week:   . Minutes of Exercise per Session:   Stress:   . Feeling of Stress :   Social Connections:   . Frequency of Communication with Friends and Family:   . Frequency of Social Gatherings with Friends and Family:   . Attends Religious  Services:   . Active Member of Clubs or Organizations:   . Attends Archivist Meetings:   Marland Kitchen Marital Status:   Intimate Partner Violence:   . Fear of Current or Ex-Partner:   . Emotionally Abused:   Marland Kitchen Physically Abused:   . Sexually Abused:     Family History  Problem Relation Age of Onset  . Hypertension Mother   . Other Father        MVA  . Diabetes Maternal Grandfather   . Colon cancer Neg Hx   . Stomach cancer Neg Hx   . Rectal cancer Neg Hx     Past Medical History:  Diagnosis Date  . ALLERGIC RHINITIS   . Anal skin tag 09/13/2017  . COMMON MIGRAINE   . GLUCOSE INTOLERANCE   . Hx of colonic polyp 09/20/2017  . Hypercalcemia   . HYPERLIPIDEMIA   . HYPERTENSION   . INSOMNIA   . OBESITY   . RENAL INSUFFICIENCY     Patient Active Problem List   Diagnosis Date Noted  . Tinea corporis 09/16/2019  . Cervicogenic headache 05/17/2019  . 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 New daily persistent headache 05/06/2019  . Tension headache 05/06/2019  . Acute cystitis with hematuria 06/04/2018  . Ganglion cyst of wrist, left 05/29/2018  . Hx of colonic polyp 09/20/2017  . Anal skin tag 09/13/2017  . Adjustment reaction with anxiety and depression 05/24/2017  . Lateral epicondylitis 05/23/2016  . Diabetes ([place]) 05/23/2016  . Snoring 09/07/2015  . Insomnia 06/02/2009  . Obesity 05/19/2008  . Chronic kidney disease 05/19/2008  . Hyperlipidemia 04/17/2008  . Intractable migraine without aura and with status migrainosus 04/17/2008  . Essential hypertension 04/17/2008  . ALLERGIC RHINITIS 04/17/2008    Past Surgical History:  Procedure Laterality Date  . BREAST SURGERY  1998   Implants  . hypertension post partum  09/2006   Not precalmpsia    Current Outpatient Medications  Medication Sig Dispense Refill  . amLODipine (NORVASC) 5 MG tablet Take 1 tablet (5 mg total) by mouth daily. 90 tablet 0  . atorvastatin (LIPITOR) 40 MG tablet TAKE 1 TABLET  BY MOUTH DAILY 90 tablet 3  . azelastine (ASTELIN) 0.1 % nasal spray azelastine 137 mcg (0.1 %) nasal spray aerosol    . ciclopirox (LOPROX) 0.77 % cream Apply topically 2 (two) times daily. 30 g 1  . Diclofenac Sodium (PENNSAID) 2 % SOLN Pennsaid 20 mg/gram/actuation (2 %) topical soln in metered-dose pump  APPLY ONE PUMP TOPICALLY TO THE AFFECTED AREA TWICE DAILY AS NEEDED    . DOXYCYCLINE MONOHYDRATE PO Take 50 mg by mouth 2 (two) times daily.    . fenofibrate (TRICOR) 145 MG tablet Take 1 tablet (145 mg total) by mouth daily. 90 tablet 1  . Fremanezumab-vfrm (AJOVY) 225 MG/1.5ML SOAJ Inject 225 mg into the skin every 30 (thirty)  days. 1.5 mL 11  . hydrOXYzine (ATARAX/VISTARIL) 25 MG tablet TAKE 1-2 TABLETS (25-50 MG TOTAL) BY MOUTH AT BEDTIME AS NEEDED (FOR SLEEP). 180 tablet 1  . labetalol (NORMODYNE) 200 MG tablet TAKE 2 TABLETS BY MOUTH 3 TIMES DAILY GENERIC EQUIVALENT FOR NORMODYNE 540 tablet 1  . Olopatadine HCl (PAZEO) 0.7 % SOLN Pazeo 0.7 % eye drops  INSTILL 1 DROP INTO BOTH EYES ONCE A DAY AS NEEDED    . RESTASIS 0.05 % ophthalmic emulsion INSTILL 1 DROP INTO BOTH EYES TWICE A DAY INSTILL 1 DROP INTO BOTH EYES TWICE DAILY    . Rimegepant Sulfate (NURTEC) 75 MG TBDP Take 75 mg by mouth daily as needed (MIGRAINE). Max of 1 tablet per day. 10 tablet 10  . sertraline (ZOLOFT) 50 MG tablet TAKE 1 TABLET BY MOUTH EVERYDAY AT BEDTIME 90 tablet 1  . triamterene-hydrochlorothiazide (DYAZIDE) 37.5-25 MG capsule Take 1 each (1 capsule total) by mouth daily. 90 capsule 3   No current facility-administered medications for this visit.    Allergies as of 10/03/2019 - Review Complete 09/16/2019  Allergen Reaction Noted  . Lisinopril  09/02/2008  . Olmesartan medoxomil  09/16/2008    Vitals: There were no vitals taken for this visit. Last Weight:  Wt Readings from Last 1 Encounters:  05/28/19 219 lb (99.3 kg)   Last Height:   Ht Readings from Last 1 Encounters:  09/16/19 5\' 4"  (1.626 m)      Physical exam: Exam: Gen: NAD, conversant, well nourised, obese, well groomed                     CV: RRR, no MRG. No Carotid Bruits. No peripheral edema, warm, nontender Eyes: Conjunctivae clear without exudates or hemorrhage  Neuro: Detailed Neurologic Exam  Speech:    Speech is normal; fluent and spontaneous with normal comprehension.  Cognition:    The patient is oriented to person, place, and time;     recent and remote memory intact;     language fluent;     normal attention, concentration,     fund of knowledge Cranial Nerves:    The pupils are equal, round, and reactive to light. The fundi are normal and spontaneous venous pulsations are present. Visual fields are full to finger confrontation. Extraocular movements are intact. Trigeminal sensation is intact and the muscles of mastication are normal. The face is symmetric. The palate elevates in the midline. Hearing intact. Voice is normal. Shoulder shrug is normal. The tongue has normal motion without fasciculations.   Coordination:    Normal finger to nose and heel to shin. Normal rapid alternating movements.   Gait:    Heel-toe and tandem gait are normal.   Motor Observation:    No asymmetry, no atrophy, and no involuntary movements noted. Tone:    Normal muscle tone.    Posture:    Posture is normal. normal erect    Strength:    Strength is V/V in the upper and lower limbs.      Sensation: intact to LT     Reflex Exam:  DTR's:    Deep tendon reflexes in the upper and lower extremities are normal bilaterally.   Toes:    The toes are downgoing bilaterally.   Clonus:    Clonus is absent.    Assessment/Plan:  Patient with new onset intractable headache. She has a remote history of migraines and now going through menopause which could be triggering migraines again. Pain left temporal,  pounding, light and sound sensitivity, radiating to the left cervical muscles. Migraine cocktail and nerve blocks today  with great improvement   Performed by Dr. Jaynee Eagles M.D. 30-gauge needle was used. All procedures a documented blood were medically necessary, reasonable and appropriate based on the patient's history, medical diagnosis and physician opinion. Verbal informed consent was obtained from the patient, patient was informed of potential risk of procedure, including bruising, bleeding, hematoma formation, infection, muscle weakness, muscle pain, numbness, transient hypertension, transient hyperglycemia and transient insomnia among others. All areas injected were topically clean with isopropyl rubbing alcohol. Nonsterile nonlatex gloves were worn during the procedure.  1. Greater occipital nerve block [id number]). The greater occipital nerve site was identified at the nuchal line medial to the occipital artery. Medication was injected into the left occipital nerve areas and suboccipital areas. Patient's condition is associated with inflammation of the greater occipital nerve and associated multiple groups. Injection was deemed medically necessary, reasonable and appropriate. Injection represents a separate and unique surgical service.  2. Supraorbital nerve block (64400): Supraorbital nerve site was identified along the incision of the frontal bone on the orbital/supraorbital ridge. Medication was injected into the left supraorbital nerve areas. Patient's condition is associated with inflammation of the supraorbital and associated muscle groups. Injection was deemed medically necessary, reasonable and appropriate. Injection represents a separate and unique surgical service.     No orders of the defined types were placed in this encounter.  A total of 80  minutes was spent on this patient's care, reviewing imaging, past records, recent hospitalization notes and results. Over half this time was spent on counseling patient on the  No diagnosis found. diagnosis and different diagnostic and therapeutic options, counseling  and coordination of care, risks and benefitsof management, compliance, or risk factor reduction and education.  This does not include time spent on nerve nlocks.    Cc: Binnie Rail, MD,    Sarina Ill, MD  [facility] [address] [place], [place] [address]  Phone [phone] Fax [phone]

## 2019-10-03 ENCOUNTER — Telehealth: Payer: Self-pay | Admitting: *Deleted

## 2019-10-03 ENCOUNTER — Encounter: Payer: Self-pay | Admitting: Neurology

## 2019-10-03 ENCOUNTER — Ambulatory Visit: Payer: Self-pay | Admitting: Neurology

## 2019-10-03 NOTE — Telephone Encounter (Signed)
Patient no showed follow-up this morning.

## 2019-10-15 ENCOUNTER — Other Ambulatory Visit: Payer: Self-pay | Admitting: Internal Medicine

## 2019-10-15 DIAGNOSIS — I10 Essential (primary) hypertension: Secondary | ICD-10-CM

## 2019-11-11 ENCOUNTER — Other Ambulatory Visit: Payer: Self-pay | Admitting: Internal Medicine

## 2019-11-13 NOTE — Progress Notes (Signed)
Subjective:    Patient ID: [name], female    DOB: December 27, 1965, 54 y.o.   MRN: [id number]  HPI The patient is here for follow up of their chronic medical problems, including dm, htn, hyperlipidemia, depression, anxiety, obesity.   Around 4:30 pm she gets very drowsy and has to lay down for an hour.  She does not always sleep well at night.  She is currently taking an herbal supplement and it helps a little.  She does not want to take any prescription medication.  She does drink caffeine in the morning and occasionally in the afternoon.  She is back on her keto diet.  She has lost weight.  She is walking some.  She does a B12 injection monthly.    Takes advil as needed for headaches. Intermittent abdominal pain- usually in evening.  It is in the upper abdomen.  It can last 10 minutes-much longer.  She has not noticed any specific trigger.  She denies GERD.  She occasionally has nausea.  Medications and allergies reviewed with patient and updated if appropriate.  Patient Active Problem List   Diagnosis Date Noted  . Tinea corporis 09/16/2019  . Cervicogenic headache 05/17/2019  . New daily persistent headache 05/06/2019  . Tension headache 05/06/2019  . Acute cystitis with hematuria 06/04/2018  . Ganglion cyst of wrist, left 05/29/2018  . Hx of colonic polyp 09/20/2017  . Anal skin tag 09/13/2017  . Adjustment reaction with anxiety and depression 05/24/2017  . Lateral epicondylitis 05/23/2016  . Diabetes ([place]) 05/23/2016  . Snoring 09/07/2015  . Insomnia 06/02/2009  . Obesity 05/19/2008  . Chronic kidney disease 05/19/2008  . Hyperlipidemia 04/17/2008  . Intractable migraine without aura and with status migrainosus 04/17/2008  . Essential hypertension 04/17/2008  . ALLERGIC RHINITIS 04/17/2008    Current Outpatient Medications on File Prior to Visit  Medication Sig Dispense Refill  . atorvastatin (LIPITOR) 40 MG tablet TAKE 1 TABLET BY MOUTH DAILY 90 tablet 3  .  azelastine (ASTELIN) 0.1 % nasal spray azelastine 137 mcg (0.1 %) nasal spray aerosol    . ciclopirox (LOPROX) 0.77 % cream Apply topically 2 (two) times daily. 30 g 1  . Diclofenac Sodium (PENNSAID) 2 % SOLN Pennsaid 20 mg/gram/actuation (2 %) topical soln in metered-dose pump  APPLY ONE PUMP TOPICALLY TO THE AFFECTED AREA TWICE DAILY AS NEEDED    . DOXYCYCLINE MONOHYDRATE PO Take 50 mg by mouth 2 (two) times daily.    . fenofibrate (TRICOR) 145 MG tablet Take 1 tablet (145 mg total) by mouth daily. 90 tablet 1  . Fremanezumab-vfrm (AJOVY) 225 MG/1.5ML SOAJ Inject 225 mg into the skin every 30 (thirty) days. 1.5 mL 11  . hydrOXYzine (ATARAX/VISTARIL) 25 MG tablet TAKE 1-2 TABLETS (25-50 MG TOTAL) BY MOUTH AT BEDTIME AS NEEDED (FOR SLEEP). 180 tablet 1  . labetalol (NORMODYNE) 200 MG tablet TAKE 2 TABLETS BY MOUTH 3 TIMES DAILY GENERIC EQUIVALENT FOR NORMODYNE 540 tablet 1  . Olopatadine HCl (PAZEO) 0.7 % SOLN Pazeo 0.7 % eye drops  INSTILL 1 DROP INTO BOTH EYES ONCE A DAY AS NEEDED    . RESTASIS 0.05 % ophthalmic emulsion INSTILL 1 DROP INTO BOTH EYES TWICE A DAY INSTILL 1 DROP INTO BOTH EYES TWICE DAILY    . Rimegepant Sulfate (NURTEC) 75 MG TBDP Take 75 mg by mouth daily as needed (MIGRAINE). Max of 1 tablet per day. 10 tablet 10  . sertraline (ZOLOFT) 50 MG tablet TAKE 1 TABLET  BY MOUTH EVERYDAY AT BEDTIME 90 tablet 1  . triamterene-hydrochlorothiazide (DYAZIDE) 37.5-25 MG capsule Take 1 each (1 capsule total) by mouth daily. 90 capsule 3   No current facility-administered medications on file prior to visit.    Past Medical History:  Diagnosis Date  . ALLERGIC RHINITIS   . Anal skin tag 09/13/2017  . COMMON MIGRAINE   . GLUCOSE INTOLERANCE   . Hx of colonic polyp 09/20/2017  . Hypercalcemia   . HYPERLIPIDEMIA   . HYPERTENSION   . INSOMNIA   . OBESITY   . RENAL INSUFFICIENCY     Past Surgical History:  Procedure Laterality Date  . BREAST SURGERY  1998   Implants  . hypertension  post partum  09/2006   Not precalmpsia    Social History   Socioeconomic History  . Marital status: Married    Spouse name: Johnathon  . Number of children: 1  . Years of education: 69  . Highest education level: Not on file  Occupational History  . Occupation: [employer]  Tobacco Use  . Smoking status: Former Smoker    Quit date: 05/09/2005    Years since quitting: 14.5  . Smokeless tobacco: Never Used  . Tobacco comment: Married, lives with spouse and son. 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 Vaping Use: Never used  Substance and Sexual Activity  . Alcohol use: Yes    Comment: social  . Drug use: No  . Sexual activity: Not on file  Other Topics Concern  . Not on file  Social History Narrative   Right handed    Coffee daily   Lives with husband and son. She also takes care of her mother who has Alzheimers   Social Determinants of Health   Financial Resource Strain:   . Difficulty of Paying Living Expenses:   Food Insecurity:   . Worried About Charity fundraiser in the Last Year:   . Arboriculturist in the Last Year:   Transportation Needs:   . Film/video editor (Medical):   Marland Kitchen Lack of Transportation (Non-Medical):   Physical Activity:   . Days of Exercise per Week:   . Minutes of Exercise per Session:   Stress:   . Feeling of Stress :   Social Connections:   . Frequency of Communication with Friends and Family:   . Frequency of Social Gatherings with Friends and Family:   . Attends Religious Services:   . Active Member of Clubs or Organizations:   . Attends Archivist Meetings:   Marland Kitchen Marital Status:     Family History  Problem Relation Age of Onset  . Hypertension Mother   . Other Father        MVA  . Diabetes Maternal Grandfather   . Colon cancer Neg Hx   . Stomach cancer Neg Hx   . Rectal cancer Neg Hx     Review of Systems  Constitutional: Negative for chills and fever.  Respiratory:  Negative for cough, shortness of breath and wheezing.   Cardiovascular: Positive for leg swelling (with prolonged sitting). Negative for chest pain and palpitations.  Gastrointestinal: Positive for abdominal pain (upper abdomen - eating makes it worse sometimes, sometimes just flares, pepto helps a little- occ cramping), diarrhea (occ) and nausea (sometimes). Negative for blood in stool and constipation.       No gerd  Skin:       Hair loss  Neurological: Positive for headaches. Negative for light-headedness.       Objective:   Vitals:   11/14/19 0959  BP: 124/80  Pulse: (!) 101  Temp: 98.1 F (36.7 C)  SpO2: 98%   BP Readings from Last 3 Encounters:  11/14/19 124/80  09/16/19 118/76  05/28/19 119/87   Wt Readings from Last 3 Encounters:  11/14/19 204 lb (92.5 kg)  05/28/19 219 lb (99.3 kg)  05/06/19 214 lb (97.1 kg)   Body mass index is 35.02 kg/m.   Physical Exam    Constitutional: Appears well-developed and well-nourished. No distress.  HENT:  Head: Normocephalic and atraumatic.  Neck: Neck supple. No tracheal deviation present. No thyromegaly present.  No cervical lymphadenopathy Cardiovascular: Normal rate, regular rhythm and normal heart sounds.   No murmur heard. No carotid bruit .  No edema Pulmonary/Chest: Effort normal and breath sounds normal. No respiratory distress. No has no wheezes. No rales.  Abd: obese, ventral hernia that is slightly tender.  No other abdominal tenderness, soft Skin: Skin is warm and dry. Not diaphoretic.  Psychiatric: Normal mood and affect. Behavior is normal.      Assessment & Plan:    See Problem List for Assessment and Plan of chronic medical problems.    This visit occurred during the SARS-CoV-2 public health emergency.  Safety protocols were in place, including screening questions prior to the visit, additional usage of staff PPE, and extensive cleaning of exam room while observing appropriate contact time as indicated  for disinfecting solutions.

## 2019-11-13 NOTE — Patient Instructions (Addendum)
  Blood work was ordered.   ° ° °Medications reviewed and updated.  Changes include :   none ° ° ° °Please followup in 6 months ° ° °

## 2019-11-14 ENCOUNTER — Ambulatory Visit: Payer: BC Managed Care – PPO | Admitting: Internal Medicine

## 2019-11-14 ENCOUNTER — Other Ambulatory Visit: Payer: Self-pay

## 2019-11-14 ENCOUNTER — Encounter: Payer: Self-pay | Admitting: Internal Medicine

## 2019-11-14 VITALS — BP 124/80 | HR 101 | Temp 98.1°F | Ht 64.0 in | Wt 204.0 lb

## 2019-11-14 DIAGNOSIS — Z1159 Encounter for screening for other viral diseases: Secondary | ICD-10-CM

## 2019-11-14 DIAGNOSIS — Z6841 Body Mass Index (BMI) 40.0 and over, adult: Secondary | ICD-10-CM

## 2019-11-14 DIAGNOSIS — I10 Essential (primary) hypertension: Secondary | ICD-10-CM | POA: Diagnosis not present

## 2019-11-14 DIAGNOSIS — F32A Depression, unspecified: Secondary | ICD-10-CM

## 2019-11-14 DIAGNOSIS — G4709 Other insomnia: Secondary | ICD-10-CM

## 2019-11-14 DIAGNOSIS — E7849 Other hyperlipidemia: Secondary | ICD-10-CM

## 2019-11-14 DIAGNOSIS — K439 Ventral hernia without obstruction or gangrene: Secondary | ICD-10-CM | POA: Insufficient documentation

## 2019-11-14 DIAGNOSIS — L659 Nonscarring hair loss, unspecified: Secondary | ICD-10-CM | POA: Insufficient documentation

## 2019-11-14 DIAGNOSIS — F329 Major depressive disorder, single episode, unspecified: Secondary | ICD-10-CM

## 2019-11-14 DIAGNOSIS — E119 Type 2 diabetes mellitus without complications: Secondary | ICD-10-CM | POA: Diagnosis not present

## 2019-11-14 DIAGNOSIS — F419 Anxiety disorder, unspecified: Secondary | ICD-10-CM

## 2019-11-14 MED ORDER — AMLODIPINE BESYLATE 5 MG PO TABS: 5.0000 mg | ORAL_TABLET | Freq: Every day | ORAL | 1 refills | Status: DC

## 2019-11-14 NOTE — Assessment & Plan Note (Signed)
Chronic Controlled, stable Continue current dose of medication-sertraline 50 mg daily  

## 2019-11-14 NOTE — Assessment & Plan Note (Signed)
Chronic She has lost weight She is exercising regularly She is doing a keto-based diet Continue efforts

## 2019-11-14 NOTE — Assessment & Plan Note (Addendum)
Chronic Sleep varies - some nights she does not sleep but does not want to take medication - overall doing ok Taking a natural supplement-we will continue Stressed no naps during the day Advised decreasing caffeine during the day

## 2019-11-14 NOTE — Assessment & Plan Note (Signed)
New problem Ventral hernia-it is causing some tenderness and she is interested in having it surgically repaired She will let me know if and when she wants a referral for surgery

## 2019-11-14 NOTE — Assessment & Plan Note (Signed)
Acute She has noticed an increased diffuse hair loss She knows this could be related to stress and she does have some increased stress She also wonders if this is hormonal related or since she is perimenopausal We will check TSH to rule out thyroid cause

## 2019-11-14 NOTE — Assessment & Plan Note (Signed)
Chronic BP well controlled Current regimen effective and well tolerated Continue current medications at current doses cmp  

## 2019-11-14 NOTE — Assessment & Plan Note (Signed)
Chronic Check lipid panel, CMP Continue atorvastatin, fenofibrate Continue weight loss efforts, regular exercise and healthy diet

## 2019-11-14 NOTE — Assessment & Plan Note (Signed)
Chronic Diet controlled Check a1c Continue Low sugar / carb diet Continue regular exercise

## 2019-11-15 ENCOUNTER — Telehealth: Payer: Self-pay | Admitting: Internal Medicine

## 2019-11-15 DIAGNOSIS — R81 Glycosuria: Secondary | ICD-10-CM | POA: Diagnosis not present

## 2019-11-15 DIAGNOSIS — Z1231 Encounter for screening mammogram for malignant neoplasm of breast: Secondary | ICD-10-CM | POA: Diagnosis not present

## 2019-11-15 DIAGNOSIS — Z01419 Encounter for gynecological examination (general) (routine) without abnormal findings: Secondary | ICD-10-CM | POA: Diagnosis not present

## 2019-11-15 DIAGNOSIS — N898 Other specified noninflammatory disorders of vagina: Secondary | ICD-10-CM | POA: Diagnosis not present

## 2019-11-15 DIAGNOSIS — Z1389 Encounter for screening for other disorder: Secondary | ICD-10-CM | POA: Diagnosis not present

## 2019-11-15 DIAGNOSIS — Z6836 Body mass index (BMI) 36.0-36.9, adult: Secondary | ICD-10-CM | POA: Diagnosis not present

## 2019-11-15 DIAGNOSIS — Z130 Encounter for screening for diseases of the blood and blood-forming organs and certain disorders involving the immune mechanism: Secondary | ICD-10-CM | POA: Diagnosis not present

## 2019-11-15 LAB — COMPREHENSIVE METABOLIC PANEL
AG Ratio: 2.1 (calc) (ref 1.0–2.5)
ALT: 44 U/L — ABNORMAL HIGH (ref 6–29)
AST: 36 U/L — ABNORMAL HIGH (ref 10–35)
Albumin: 4.8 g/dL (ref 3.6–5.1)
Alkaline phosphatase (APISO): 69 U/L (ref 37–153)
BUN: 11 mg/dL (ref 7–25)
CO2: 31 mmol/L (ref 20–32)
Calcium: 10.4 mg/dL (ref 8.6–10.4)
Chloride: 93 mmol/L — ABNORMAL LOW (ref 98–110)
Creat: 0.9 mg/dL (ref 0.50–1.05)
Globulin: 2.3 g/dL (calc) (ref 1.9–3.7)
Glucose, Bld: 349 mg/dL — ABNORMAL HIGH (ref 65–99)
Potassium: 3.8 mmol/L (ref 3.5–5.3)
Sodium: 137 mmol/L (ref 135–146)
Total Bilirubin: 0.6 mg/dL (ref 0.2–1.2)
Total Protein: 7.1 g/dL (ref 6.1–8.1)

## 2019-11-15 LAB — HEMOGLOBIN A1C
Hgb A1c MFr Bld: 13.8 % of total Hgb — ABNORMAL HIGH (ref <5.7)
Mean Plasma Glucose: 349 (calc)
eAG (mmol/L): 19.4 (calc)

## 2019-11-15 LAB — LIPID PANEL
Cholesterol: 200 mg/dL — ABNORMAL HIGH (ref <200)
HDL: 36 mg/dL — ABNORMAL LOW (ref >=50)
Non-HDL Cholesterol (Calc): 164 mg/dL (calc) — ABNORMAL HIGH (ref <130)
Total CHOL/HDL Ratio: 5.6 (calc) — ABNORMAL HIGH (ref <5.0)
Triglycerides: 591 mg/dL — ABNORMAL HIGH (ref <150)

## 2019-11-15 LAB — MICROALBUMIN / CREATININE URINE RATIO
Creatinine, Urine: 16 mg/dL — ABNORMAL LOW (ref 20–275)
Microalb Creat Ratio: 19 mcg/mg creat (ref <30)
Microalb, Ur: 0.3 mg/dL

## 2019-11-15 LAB — HEPATITIS C ANTIBODY
Hepatitis C Ab: NONREACTIVE
SIGNAL TO CUT-OFF: 0.01 (ref <1.00)

## 2019-11-15 LAB — HM MAMMOGRAPHY

## 2019-11-15 LAB — TSH: TSH: 1.26 mIU/L

## 2019-11-15 MED ORDER — RYBELSUS 3 MG PO TABS: 3.0000 mg | ORAL_TABLET | Freq: Every day | ORAL | 0 refills | Status: DC

## 2019-11-15 MED ORDER — METFORMIN HCL 500 MG PO TABS: 500.0000 mg | ORAL_TABLET | Freq: Two times a day (BID) | ORAL | 3 refills | Status: DC

## 2019-11-15 NOTE — Telephone Encounter (Signed)
Yes, her blood work just came back today and her A1c is 13.8.  Her sugars have been extremely high and are currently not controlled.  She needs to start medication for her sugars.  Start Metformin 500 mg twice daily.  I would also like her to start Rybelsus 3 mg daily.  She can follow-up with me early next week we can discuss further.  I would like her to get on these medications this weekend.  Prescriptions pending-space and into pharmacy if she agrees.

## 2019-11-15 NOTE — Telephone Encounter (Signed)
New message:    Pt is calling and states she just left the Gyn dr and she states they said they is a lot of sugar in the pt's urine. She states they then tested her sugar and it was 356 at the time she was in the office. Pt states she was just her yesterday. Please advise.

## 2019-11-15 NOTE — Telephone Encounter (Signed)
Scripts sent in and patient agrees with plan. Appointment made for next Tuesday for follow up.

## 2019-11-18 NOTE — Progress Notes (Signed)
Subjective:    Patient ID: [name], female    DOB: 12/01/1965, 54 y.o.   MRN: [id number]  HPI The patient is here for follow up of her uncontrolled DM and to review her recent blood work.    Lab Results  Component Value Date   HGBA1C 13.8 (H) 11/14/2019    She started metformin 500 mg twice daily.  She did not receive the rybelsus from the pharmacy.  Over the past few months she has been eating healthy and has been working on weight loss and has lost weight.  She will occasionally have bread-typically keto bread.  She typically eats bacon and eggs for breakfast.  She has coffee with skinny girl flavor and may be some half-and-half.  She occasionally will have a cheat day and have some cookies, but she has not ever been on the sugars and this does not make sense.  She was taking ketones x 10 days to help her lose weight.  She last took them 2 weeks.  This is the only different thing she has done.  She was at the gynecologist last week and she had a lot of glucose in her urine.    Medications and allergies reviewed with patient and updated if appropriate.  Patient Active Problem List   Diagnosis Date Noted  . Ventral hernia without obstruction or gangrene 11/14/2019  . Hair loss 11/14/2019  . Tinea corporis 09/16/2019  . Cervicogenic headache 05/17/2019  . Tension headache 05/06/2019  . Ganglion cyst of wrist, left 05/29/2018  . Hx of colonic polyp 09/20/2017  . Anal skin tag 09/13/2017  . Anxiety and depression 05/24/2017  . Lateral epicondylitis 05/23/2016  . Diabetes ([place]) 05/23/2016  . Snoring 09/07/2015  . Insomnia 06/02/2009  . Obesity 05/19/2008  . Chronic kidney disease 05/19/2008  . Hyperlipidemia 04/17/2008  . Intractable migraine without aura and with status migrainosus 04/17/2008  . Essential hypertension 04/17/2008  . ALLERGIC RHINITIS 04/17/2008    Current Outpatient Medications on File Prior to Visit  Medication Sig Dispense Refill  .  amLODipine (NORVASC) 5 MG tablet Take 1 tablet (5 mg total) by mouth daily. 90 tablet 1  . atorvastatin (LIPITOR) 40 MG tablet TAKE 1 TABLET BY MOUTH DAILY 90 tablet 3  . azelastine (ASTELIN) 0.1 % nasal spray azelastine 137 mcg (0.1 %) nasal spray aerosol    . ciclopirox (LOPROX) 0.77 % cream Apply topically 2 (two) times daily. 30 g 1  . Diclofenac Sodium (PENNSAID) 2 % SOLN Pennsaid 20 mg/gram/actuation (2 %) topical soln in metered-dose pump  APPLY ONE PUMP TOPICALLY TO THE AFFECTED AREA TWICE DAILY AS NEEDED    . DOXYCYCLINE MONOHYDRATE PO Take 50 mg by mouth 2 (two) times daily.    . fenofibrate (TRICOR) 145 MG tablet Take 1 tablet (145 mg total) by mouth daily. 90 tablet 1  . Fremanezumab-vfrm (AJOVY) 225 MG/1.5ML SOAJ Inject 225 mg into the skin every 30 (thirty) days. 1.5 mL 11  . hydrOXYzine (ATARAX/VISTARIL) 25 MG tablet TAKE 1-2 TABLETS (25-50 MG TOTAL) BY MOUTH AT BEDTIME AS NEEDED (FOR SLEEP). 180 tablet 1  . labetalol (NORMODYNE) 200 MG tablet TAKE 2 TABLETS BY MOUTH 3 TIMES DAILY GENERIC EQUIVALENT FOR NORMODYNE 540 tablet 1  . metFORMIN (GLUCOPHAGE) 500 MG tablet Take 1 tablet (500 mg total) by mouth 2 (two) times daily with a meal. 180 tablet 3  . Olopatadine HCl (PAZEO) 0.7 % SOLN Pazeo 0.7 % eye drops  INSTILL 1 DROP INTO  BOTH EYES ONCE A DAY AS NEEDED    . RESTASIS 0.05 % ophthalmic emulsion INSTILL 1 DROP INTO BOTH EYES TWICE A DAY INSTILL 1 DROP INTO BOTH EYES TWICE DAILY    . Rimegepant Sulfate (NURTEC) 75 MG TBDP Take 75 mg by mouth daily as needed (MIGRAINE). Max of 1 tablet per day. 10 tablet 10  . sertraline (ZOLOFT) 50 MG tablet TAKE 1 TABLET BY MOUTH EVERYDAY AT BEDTIME 90 tablet 1  . triamcinolone cream (KENALOG) 0.1 % SMARTSIG:1 Application Topical 2-3 Times Daily    . triamterene-hydrochlorothiazide (DYAZIDE) 37.5-25 MG capsule Take 1 each (1 capsule total) by mouth daily. 90 capsule 3   No current facility-administered medications on file prior to visit.     Past Medical History:  Diagnosis Date  . ALLERGIC RHINITIS   . Anal skin tag 09/13/2017  . COMMON MIGRAINE   . GLUCOSE INTOLERANCE   . Hx of colonic polyp 09/20/2017  . Hypercalcemia   . HYPERLIPIDEMIA   . HYPERTENSION   . INSOMNIA   . OBESITY   . RENAL INSUFFICIENCY     Past Surgical History:  Procedure Laterality Date  . BREAST SURGERY  1998   Implants  . hypertension post partum  09/2006   Not precalmpsia    Social History   Socioeconomic History  . Marital status: Married    Spouse name: Johnathon  . Number of children: 1  . Years of education: 78  . Highest education level: Not on file  Occupational History  . Occupation: [employer]  Tobacco Use  . Smoking status: Former Smoker    Quit date: 05/09/2005    Years since quitting: 14.5  . Smokeless tobacco: Never Used  . Tobacco comment: Married, lives with spouse and son. 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 Vaping Use: Never used  Substance and Sexual Activity  . Alcohol use: Yes    Comment: social  . Drug use: No  . Sexual activity: Not on file  Other Topics Concern  . Not on file  Social History Narrative   Right handed    Coffee daily   Lives with husband and son. She also takes care of her mother who has Alzheimers   Social Determinants of Health   Financial Resource Strain:   . Difficulty of Paying Living Expenses:   Food Insecurity:   . Worried About Charity fundraiser in the Last Year:   . Arboriculturist in the Last Year:   Transportation Needs:   . Film/video editor (Medical):   Marland Kitchen Lack of Transportation (Non-Medical):   Physical Activity:   . Days of Exercise per Week:   . Minutes of Exercise per Session:   Stress:   . Feeling of Stress :   Social Connections:   . Frequency of Communication with Friends and Family:   . Frequency of Social Gatherings with Friends and Family:   . Attends Religious Services:   . Active Member of Clubs or  Organizations:   . Attends Archivist Meetings:   Marland Kitchen Marital Status:     Family History  Problem Relation Age of Onset  . Hypertension Mother   . Other Father        MVA  . Diabetes Maternal Grandfather   . Colon cancer Neg Hx   . Stomach cancer Neg Hx   . Rectal cancer Neg Hx     Review of Systems  Constitutional: Positive for fatigue.  Eyes: Positive for visual disturbance (blurry vision - needs readers).  Endocrine: Positive for polydipsia and polyuria.  Neurological: Positive for light-headedness and headaches.       Objective:   Vitals:   11/19/19 1112  BP: 118/80  Pulse: 84  Temp: 98 F (36.7 C)  SpO2: 97%   BP Readings from Last 3 Encounters:  11/19/19 118/80  11/14/19 124/80  09/16/19 118/76   Wt Readings from Last 3 Encounters:  11/14/19 204 lb (92.5 kg)  05/28/19 219 lb (99.3 kg)  05/06/19 214 lb (97.1 kg)   Body mass index is 35.02 kg/m.   Physical Exam         Assessment & Plan:   30 minutes were spent face-to-face with the patient, we reviewed her lab results, discussed her uncontrolled sugars, possible causes and treatment at this time.  She has already started the Metformin.  Recent Rybelsus-1 her taking this in addition to the Metformin.  Glucometer prescribed and discussed checking sugars at home and with the desired level should be.   See Problem List for Assessment and Plan of chronic medical problems.    This visit occurred during the SARS-CoV-2 public health emergency.  Safety protocols were in place, including screening questions prior to the visit, additional usage of staff PPE, and extensive cleaning of exam room while observing appropriate contact time as indicated for disinfecting solutions.

## 2019-11-19 ENCOUNTER — Encounter: Payer: Self-pay | Admitting: Internal Medicine

## 2019-11-19 ENCOUNTER — Ambulatory Visit (INDEPENDENT_AMBULATORY_CARE_PROVIDER_SITE_OTHER): Payer: BC Managed Care – PPO | Admitting: Internal Medicine

## 2019-11-19 ENCOUNTER — Other Ambulatory Visit: Payer: Self-pay

## 2019-11-19 VITALS — BP 118/80 | HR 84 | Temp 98.0°F | Ht 64.0 in

## 2019-11-19 DIAGNOSIS — E119 Type 2 diabetes mellitus without complications: Secondary | ICD-10-CM | POA: Diagnosis not present

## 2019-11-19 DIAGNOSIS — K439 Ventral hernia without obstruction or gangrene: Secondary | ICD-10-CM | POA: Diagnosis not present

## 2019-11-19 MED ORDER — BLOOD GLUCOSE MONITOR KIT: PACK | 0 refills | Status: DC

## 2019-11-19 MED ORDER — RYBELSUS 3 MG PO TABS: 3.0000 mg | ORAL_TABLET | Freq: Every day | ORAL | 0 refills | Status: DC

## 2019-11-19 NOTE — Patient Instructions (Addendum)
Fasting glucose -  normal 70-99    prediabetic  100-125    Diabetic   126 or higher   Check your sugars 1-2 times a day.     Take the metformin twice daily.   I resent Rybelsus to [facility] - let me know if this is not covered.    Follow up in 2 months.

## 2019-11-19 NOTE — Assessment & Plan Note (Signed)
Chronic Uncontrolled A1c much higher than 6 months ago at 13.8 Possibly elevated sugars related to taking ketones which as we discussed would make her not able to use her insulin properly and therefore cause elevated sugars.  She will not take these again She is fairly compliant with a low carb/sugar diet and will continue Continue exercise Continue Metformin 500 mg ice daily before meals Start Rybelsus 3 mg daily-if tolerated will increase after 1 month.  If this is not covered will consider an injectable version She will let me know if the above medication is covered or not Glucometer prescribed-start checking sugars 1-2 times a day and update me via MyChart so we can adjust medication Follow-up in 2 months

## 2019-11-20 ENCOUNTER — Encounter: Payer: Self-pay | Admitting: Internal Medicine

## 2019-11-20 MED ORDER — METFORMIN HCL 500 MG PO TABS: 1000.0000 mg | ORAL_TABLET | Freq: Two times a day (BID) | ORAL | 3 refills | Status: DC

## 2019-11-26 MED ORDER — GLYBURIDE 5 MG PO TABS: ORAL_TABLET | ORAL | 5 refills | Status: DC

## 2019-11-26 NOTE — Addendum Note (Signed)
Addended by: Binnie Rail on: 11/26/2019 10:28 AM   Modules accepted: Orders

## 2019-11-27 ENCOUNTER — Encounter: Payer: Self-pay | Admitting: Internal Medicine

## 2019-11-27 NOTE — Progress Notes (Signed)
Outside notes received. Information abstracted. Notes sent to scan.  

## 2019-11-28 ENCOUNTER — Encounter: Payer: Self-pay | Admitting: Internal Medicine

## 2019-12-06 ENCOUNTER — Other Ambulatory Visit: Payer: Self-pay | Admitting: Internal Medicine

## 2019-12-13 ENCOUNTER — Encounter: Payer: Self-pay | Admitting: Internal Medicine

## 2019-12-13 MED ORDER — CICLOPIROX OLAMINE 0.77 % EX CREA: TOPICAL_CREAM | Freq: Two times a day (BID) | CUTANEOUS | 1 refills | Status: DC

## 2019-12-18 ENCOUNTER — Ambulatory Visit: Payer: BC Managed Care – PPO | Admitting: Family

## 2019-12-18 ENCOUNTER — Encounter: Payer: Self-pay | Admitting: Family

## 2019-12-18 ENCOUNTER — Ambulatory Visit (INDEPENDENT_AMBULATORY_CARE_PROVIDER_SITE_OTHER): Payer: BC Managed Care – PPO

## 2019-12-18 ENCOUNTER — Other Ambulatory Visit: Payer: Self-pay

## 2019-12-18 VITALS — BP 122/82 | HR 83 | Temp 98.1°F | Ht 64.0 in | Wt 203.0 lb

## 2019-12-18 DIAGNOSIS — R221 Localized swelling, mass and lump, neck: Secondary | ICD-10-CM

## 2019-12-18 DIAGNOSIS — J90 Pleural effusion, not elsewhere classified: Secondary | ICD-10-CM | POA: Diagnosis not present

## 2019-12-18 DIAGNOSIS — E049 Nontoxic goiter, unspecified: Secondary | ICD-10-CM | POA: Diagnosis not present

## 2019-12-18 DIAGNOSIS — R591 Generalized enlarged lymph nodes: Secondary | ICD-10-CM | POA: Diagnosis not present

## 2019-12-18 NOTE — Progress Notes (Signed)
[name] is a 54 y.o. female with the following history as recorded in EpicCare:  Patient Active Problem List   Diagnosis Date Noted  . Ventral hernia without obstruction or gangrene 11/14/2019  . Hair loss 11/14/2019  . Tinea corporis 09/16/2019  . Cervicogenic headache 05/17/2019  . Tension headache 05/06/2019  . Ganglion cyst of wrist, left 05/29/2018  . Hx of colonic polyp 09/20/2017  . Anal skin tag 09/13/2017  . Anxiety and depression 05/24/2017  . Lateral epicondylitis 05/23/2016  . Diabetes ([facility]) 05/23/2016  . Snoring 09/07/2015  . Insomnia 06/02/2009  . Obesity 05/19/2008  . Chronic kidney disease 05/19/2008  . Hyperlipidemia 04/17/2008  . Intractable migraine without aura and with status migrainosus 04/17/2008  . Essential hypertension 04/17/2008  . ALLERGIC RHINITIS 04/17/2008    Current Outpatient Medications  Medication Sig Dispense Refill  . amLODipine (NORVASC) 5 MG tablet Take 1 tablet (5 mg total) by mouth daily. 90 tablet 1  . atorvastatin (LIPITOR) 40 MG tablet TAKE 1 TABLET BY MOUTH DAILY 90 tablet 3  . azelastine (ASTELIN) 0.1 % nasal spray azelastine 137 mcg (0.1 %) nasal spray aerosol    . blood glucose meter kit and supplies KIT Dispense based on patient and insurance preference. Use up to four times daily as directed. (FOR E11.9). 1 each 0  . ciclopirox (LOPROX) 0.77 % cream Apply topically 2 (two) times daily. 30 g 1  . Diclofenac Sodium (PENNSAID) 2 % SOLN Pennsaid 20 mg/gram/actuation (2 %) topical soln in metered-dose pump  APPLY ONE PUMP TOPICALLY TO THE AFFECTED AREA TWICE DAILY AS NEEDED    . DOXYCYCLINE MONOHYDRATE PO Take 50 mg by mouth 2 (two) times daily.    . fenofibrate (TRICOR) 145 MG tablet Take 1 tablet (145 mg total) by mouth daily. 90 tablet 1  . Fremanezumab-vfrm (AJOVY) 225 MG/1.5ML SOAJ Inject 225 mg into the skin every 30 (thirty) days. 1.5 mL 11  . glyBURIDE (DIABETA) 5 MG tablet Take 1 pill daily with breakfast for one  week then increase to BID with meals 60 tablet 5  . hydrOXYzine (ATARAX/VISTARIL) 25 MG tablet TAKE 1-2 TABLETS (25-50 MG TOTAL) BY MOUTH AT BEDTIME AS NEEDED (FOR SLEEP). 180 tablet 1  . labetalol (NORMODYNE) 200 MG tablet TAKE 2 TABLETS BY MOUTH 3 TIMES DAILY GENERIC EQUIVALENT FOR NORMODYNE 540 tablet 1  . metFORMIN (GLUCOPHAGE) 500 MG tablet Take 2 tablets (1,000 mg total) by mouth 2 (two) times daily with a meal. 360 tablet 3  . Olopatadine HCl (PAZEO) 0.7 % SOLN Pazeo 0.7 % eye drops  INSTILL 1 DROP INTO BOTH EYES ONCE A DAY AS NEEDED    . OneTouch Delica Lancets 33G MISC Apply topically 4 (four) times daily.    Letta Pate VERIO test strip TEST 4X DAILY 100 strip 2  . RESTASIS 0.05 % ophthalmic emulsion INSTILL 1 DROP INTO BOTH EYES TWICE A DAY INSTILL 1 DROP INTO BOTH EYES TWICE DAILY    . Rimegepant Sulfate (NURTEC) 75 MG TBDP Take 75 mg by mouth daily as needed (MIGRAINE). Max of 1 tablet per day. 10 tablet 10  . Semaglutide (RYBELSUS) 3 MG TABS Take 3 mg by mouth daily. 30 tablet 0  . sertraline (ZOLOFT) 50 MG tablet TAKE 1 TABLET BY MOUTH EVERYDAY AT BEDTIME 90 tablet 1  . triamcinolone cream (KENALOG) 0.1 % SMARTSIG:1 Application Topical 2-3 Times Daily    . triamterene-hydrochlorothiazide (DYAZIDE) 37.5-25 MG capsule Take 1 each (1 capsule total) by mouth daily.  90 capsule 3   No current facility-administered medications for this visit.    Allergies: Lisinopril and Olmesartan medoxomil  Past Medical History:  Diagnosis Date  . ALLERGIC RHINITIS   . Anal skin tag 09/13/2017  . COMMON MIGRAINE   . GLUCOSE INTOLERANCE   . Hx of colonic polyp 09/20/2017  . Hypercalcemia   . HYPERLIPIDEMIA   . HYPERTENSION   . INSOMNIA   . OBESITY   . RENAL INSUFFICIENCY     Past Surgical History:  Procedure Laterality Date  . BREAST SURGERY  1998   Implants  . hypertension post partum  09/2006   Not precalmpsia    Family History  Problem Relation Age of Onset  . Hypertension Mother   .  Other Father        MVA  . Diabetes Maternal Grandfather   . Colon cancer Neg Hx   . Stomach cancer Neg Hx   . Rectal cancer Neg Hx     Social History   Tobacco Use  . Smoking status: Former Smoker    Quit date: 05/09/2005    Years since quitting: 14.6  . Smokeless tobacco: Never Used  . Tobacco comment: Married, lives with spouse and son. 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 Alcohol use: Yes    Comment: social    Subjective:  Concerned about a "lump" on the left side of her neck; 1st noticed area in the past 1-2 days; TSH checked at end of July; did have thyroid U/S with her ENT earlier this year but she admits the results were lost in follow-up.  Objective:  Vitals:   12/18/19 1041  BP: 122/82  Pulse: 83  Temp: 98.1 F (36.7 C)  TempSrc: Oral  SpO2: 96%  Weight: 203 lb (92.1 kg)  Height: '5\' 4"'$  (1.626 m)    General: Well developed, well nourished, in no acute distress  Skin : Warm and dry.  Head: Normocephalic and atraumatic  Eyes: Sclera and conjunctiva clear; pupils round and reactive to light; extraocular movements intact  Ears: External normal; canals clear; tympanic membranes normal  Oropharynx: Pink, supple. No suspicious lesions  Neck: Supple with enlarged thyroid, palpable mass noted at base of left neck Lungs: Respirations unlabored; clear to auscultation bilaterally without wheeze, rales, rhonchi  Neurologic: Alert and oriented; speech intact; face symmetrical; moves all extremities well; CNII-XII intact without focal deficit   Assessment:  1. Enlarged thyroid   2. Neck mass   3. Lymphadenopathy     Plan:  1. Reviewed thyroid ultrasound with patient that was done earlier this year; there were areas that were recommended to biopsy; will repeat the ultrasound and refer to surgeon; 2. ? If this is related to enlarged thyroid or lymph node; check neck U/S- may need to consider CT; 3. Update CXR today;    Time spent 30 minutes reviewing records/ discussing results with patient and discussing treatment plans  This visit occurred during the SARS-CoV-2 public health emergency.  Safety protocols were in place, including screening questions prior to the visit, additional usage of staff PPE, and extensive cleaning of exam room while observing appropriate contact time as indicated for disinfecting solutions.     No follow-ups on file.  Orders Placed This Encounter  Procedures  . US Soft Tissue Head/Neck (NON-THYROID)    Standing Status:   Future    Standing Expiration Date:   12/17/2020    Order Specific Question:  Reason for Exam (SYMPTOM  OR DIAGNOSIS REQUIRED)    Answer:   lump left side of lower neck    Order Specific Question:   Preferred imaging location?    Answer:   [facility]  . US THYROID    Standing Status:   Future    Standing Expiration Date:   12/17/2020    Order Specific Question:   Reason for Exam (SYMPTOM  OR DIAGNOSIS REQUIRED)    Answer:   thyroid enlarged    Order Specific Question:   Preferred imaging location?    Answer:   [facility]  . DG Chest 2 View    Standing Status:   Future    Number of Occurrences:   1    Standing Expiration Date:   12/17/2020    Order Specific Question:   Reason for Exam (SYMPTOM  OR DIAGNOSIS REQUIRED)    Answer:   lymphadneopathy    Order Specific Question:   Is patient pregnant?    Answer:   No    Order Specific Question:   Preferred imaging location?    Answer:   [facility]    Order Specific Question:   Radiology Contrast Protocol - do NOT remove file path    Answer:   [email or web address]    Requested Prescriptions    No prescriptions requested or ordered in this encounter

## 2019-12-20 ENCOUNTER — Other Ambulatory Visit: Payer: Self-pay | Admitting: Family

## 2019-12-20 ENCOUNTER — Encounter: Payer: Self-pay | Admitting: Family

## 2019-12-20 DIAGNOSIS — R9389 Abnormal findings on diagnostic imaging of other specified body structures: Secondary | ICD-10-CM

## 2019-12-20 DIAGNOSIS — J90 Pleural effusion, not elsewhere classified: Secondary | ICD-10-CM

## 2019-12-20 MED ORDER — DOXYCYCLINE HYCLATE 100 MG PO TABS: 100.0000 mg | ORAL_TABLET | Freq: Two times a day (BID) | ORAL | 0 refills | Status: DC

## 2019-12-25 ENCOUNTER — Ambulatory Visit
Admission: RE | Admit: 2019-12-25 | Discharge: 2019-12-25 | Disposition: A | Discharge status: Home / Self Care | Payer: BC Managed Care – PPO | Source: Ambulatory Visit | Attending: Family | Admitting: Family

## 2019-12-25 ENCOUNTER — Other Ambulatory Visit: Payer: Self-pay

## 2019-12-25 DIAGNOSIS — E010 Iodine-deficiency related diffuse (endemic) goiter: Secondary | ICD-10-CM | POA: Diagnosis not present

## 2019-12-25 DIAGNOSIS — E042 Nontoxic multinodular goiter: Secondary | ICD-10-CM | POA: Diagnosis not present

## 2019-12-25 DIAGNOSIS — E049 Nontoxic goiter, unspecified: Secondary | ICD-10-CM

## 2019-12-27 ENCOUNTER — Other Ambulatory Visit: Payer: Self-pay | Admitting: Family

## 2019-12-27 DIAGNOSIS — E042 Nontoxic multinodular goiter: Secondary | ICD-10-CM

## 2019-12-27 NOTE — Progress Notes (Signed)
Reviewed with patient; will schedule biopsy; she understands and agrees to treatment plan.

## 2019-12-28 ENCOUNTER — Other Ambulatory Visit: Payer: Self-pay | Admitting: Family

## 2019-12-28 DIAGNOSIS — E042 Nontoxic multinodular goiter: Secondary | ICD-10-CM

## 2019-12-31 ENCOUNTER — Telehealth: Payer: Self-pay

## 2019-12-31 NOTE — Telephone Encounter (Signed)
Thyroid u/s results were dictated incorrectly. Wanted confirmation that [name] wanted nodules 1 & 5 biopsied vs nodules 5 & 6.  1218: Spoke with [name]; & she confirms that she wants nodules 1 & 5 biopsied on tomorrow.  [name] called, aware, & verb understanding.

## 2020-01-01 ENCOUNTER — Ambulatory Visit
Admission: RE | Admit: 2020-01-01 | Discharge: 2020-01-01 | Disposition: A | Discharge status: Home / Self Care | Payer: BC Managed Care – PPO | Source: Ambulatory Visit | Attending: Family | Admitting: Family

## 2020-01-01 ENCOUNTER — Other Ambulatory Visit (HOSPITAL_COMMUNITY)
Admission: RE | Admit: 2020-01-01 | Discharge: 2020-01-01 | Disposition: A | Discharge status: Home / Self Care | Payer: BC Managed Care – PPO | Source: Ambulatory Visit | Attending: Family | Admitting: Family

## 2020-01-01 ENCOUNTER — Ambulatory Visit (INDEPENDENT_AMBULATORY_CARE_PROVIDER_SITE_OTHER)
Admission: RE | Admit: 2020-01-01 | Discharge: 2020-01-01 | Disposition: A | Discharge status: Home / Self Care | Payer: BC Managed Care – PPO | Source: Ambulatory Visit | Attending: Family | Admitting: Family

## 2020-01-01 ENCOUNTER — Other Ambulatory Visit: Payer: Self-pay

## 2020-01-01 DIAGNOSIS — E041 Nontoxic single thyroid nodule: Secondary | ICD-10-CM | POA: Insufficient documentation

## 2020-01-01 DIAGNOSIS — E042 Nontoxic multinodular goiter: Secondary | ICD-10-CM | POA: Diagnosis not present

## 2020-01-01 DIAGNOSIS — R9389 Abnormal findings on diagnostic imaging of other specified body structures: Secondary | ICD-10-CM | POA: Diagnosis not present

## 2020-01-01 DIAGNOSIS — J90 Pleural effusion, not elsewhere classified: Secondary | ICD-10-CM

## 2020-01-02 ENCOUNTER — Other Ambulatory Visit: Payer: BC Managed Care – PPO

## 2020-01-02 ENCOUNTER — Other Ambulatory Visit: Payer: Self-pay | Admitting: Family

## 2020-01-02 DIAGNOSIS — E042 Nontoxic multinodular goiter: Secondary | ICD-10-CM

## 2020-01-02 LAB — CYTOLOGY - NON PAP

## 2020-01-04 DIAGNOSIS — E042 Nontoxic multinodular goiter: Secondary | ICD-10-CM | POA: Diagnosis not present

## 2020-01-07 ENCOUNTER — Other Ambulatory Visit: Payer: Self-pay | Admitting: Family

## 2020-01-07 ENCOUNTER — Other Ambulatory Visit: Payer: Self-pay | Admitting: Internal Medicine

## 2020-01-07 DIAGNOSIS — J90 Pleural effusion, not elsewhere classified: Secondary | ICD-10-CM

## 2020-01-09 DIAGNOSIS — E669 Obesity, unspecified: Secondary | ICD-10-CM | POA: Diagnosis not present

## 2020-01-09 DIAGNOSIS — R1010 Upper abdominal pain, unspecified: Secondary | ICD-10-CM | POA: Diagnosis not present

## 2020-01-09 DIAGNOSIS — E042 Nontoxic multinodular goiter: Secondary | ICD-10-CM | POA: Diagnosis not present

## 2020-01-09 DIAGNOSIS — M6208 Separation of muscle (nontraumatic), other site: Secondary | ICD-10-CM | POA: Diagnosis not present

## 2020-01-15 ENCOUNTER — Encounter: Payer: Self-pay | Admitting: Family

## 2020-01-17 ENCOUNTER — Other Ambulatory Visit: Payer: Self-pay | Admitting: Internal Medicine

## 2020-01-19 NOTE — Progress Notes (Signed)
Subjective:    Patient ID: [name], female    DOB: [birth year], 54 y.o.   MRN: [id number]  HPI The patient is here for follow up of their chronic medical problems, including DM, htn, hyperlipidemia, anxiety and depression   She is taking all of her medications as prescribed.   She is eating a low carb/sugar diet.   She is walking irregularly.   She checks her sugars.  Last night her sugar was 50 - she did not feel good.  She had not eaten in 6 hours.  After she ate the sugar came back up.  Typically sugars are 87-123.    She had a recent thyroid biopsy and is waiting for the results to come back.  She does have an appointment for surgeon to further evaluate the thyroid.  Medications and allergies reviewed with patient and updated if appropriate.  Patient Active Problem List   Diagnosis Date Noted  . Ventral hernia without obstruction or gangrene 11/14/2019  . Hair loss 11/14/2019  . Tinea corporis 09/16/2019  . Cervicogenic headache 05/17/2019  . Tension headache 05/06/2019  . Ganglion cyst of wrist, left 05/29/2018  . Hx of colonic polyp 09/20/2017  . Anal skin tag 09/13/2017  . Anxiety and depression 05/24/2017  . Lateral epicondylitis 05/23/2016  . Diabetes ([place]) 05/23/2016  . Snoring 09/07/2015  . Insomnia 06/02/2009  . Obesity 05/19/2008  . Chronic kidney disease 05/19/2008  . Hyperlipidemia 04/17/2008  . Intractable migraine without aura and with status migrainosus 04/17/2008  . Essential hypertension 04/17/2008  . ALLERGIC RHINITIS 04/17/2008    Current Outpatient Medications on File Prior to Visit  Medication Sig Dispense Refill  . amLODipine (NORVASC) 5 MG tablet Take 1 tablet (5 mg total) by mouth daily. 90 tablet 1  . atorvastatin (LIPITOR) 40 MG tablet TAKE 1 TABLET BY MOUTH DAILY 90 tablet 3  . azelastine (ASTELIN) 0.1 % nasal spray azelastine 137 mcg (0.1 %) nasal spray aerosol    . blood glucose meter kit and supplies KIT Dispense based on  patient and insurance preference. Use up to four times daily as directed. (FOR E11.9). 1 each 0  . ciclopirox (LOPROX) 0.77 % cream Apply topically 2 (two) times daily. 30 g 1  . Diclofenac Sodium (PENNSAID) 2 % SOLN Pennsaid 20 mg/gram/actuation (2 %) topical soln in metered-dose pump  APPLY ONE PUMP TOPICALLY TO THE AFFECTED AREA TWICE DAILY AS NEEDED    . fenofibrate (TRICOR) 145 MG tablet TAKE 1 TABLET BY MOUTH DAILY. GENERIC EQUIVALENT FOR TRICOR 90 tablet 1  . Fremanezumab-vfrm (AJOVY) 225 MG/1.5ML SOAJ Inject 225 mg into the skin every 30 (thirty) days. 1.5 mL 11  . glucose blood test strip OneTouch Verio test strips  TEST 4X DAILY    . glyBURIDE (DIABETA) 5 MG tablet Take 1 pill daily with breakfast for one week then increase to BID with meals 60 tablet 5  . hydrOXYzine (ATARAX/VISTARIL) 25 MG tablet TAKE 1-2 TABLETS (25-50 MG TOTAL) BY MOUTH AT BEDTIME AS NEEDED (FOR SLEEP). 180 tablet 1  . labetalol (NORMODYNE) 200 MG tablet TAKE 2 TABLETS BY MOUTH 3 TIMES DAILY. GENERIC EQUIVALENT FOR NORMODYNE. 540 tablet 1  . metFORMIN (GLUCOPHAGE) 500 MG tablet Take 2 tablets (1,000 mg total) by mouth 2 (two) times daily with a meal. 360 tablet 3  . Olopatadine HCl (PAZEO) 0.7 % SOLN [place]o 0.7 % eye drops  INSTILL 1 DROP INTO BOTH EYES ONCE A DAY AS NEEDED    .  OneTouch Delica Lancets 18H MISC Apply topically 4 (four) times daily.    Glory Rosebush Delica Lancets 63J MISC OneTouch Delica Lancets 33 gauge  CHECK BLOOD SUGAR 4 TIMES A DAY    . ONETOUCH VERIO test strip TEST 4X DAILY 100 strip 2  . RESTASIS 0.05 % ophthalmic emulsion INSTILL 1 DROP INTO BOTH EYES TWICE A DAY INSTILL 1 DROP INTO BOTH EYES TWICE DAILY    . Rimegepant Sulfate (NURTEC) 75 MG TBDP Take 75 mg by mouth daily as needed (MIGRAINE). Max of 1 tablet per day. 10 tablet 10  . Semaglutide (RYBELSUS) 3 MG TABS Take 3 mg by mouth daily. 30 tablet 0  . sertraline (ZOLOFT) 50 MG tablet TAKE 1 TABLET BY MOUTH EVERYDAY AT BEDTIME 90 tablet 1   . triamcinolone cream (KENALOG) 0.1 % SMARTSIG:1 Application Topical 2-3 Times Daily    . triamterene-hydrochlorothiazide (DYAZIDE) 37.5-25 MG capsule Take 1 each (1 capsule total) by mouth daily. 90 capsule 3   No current facility-administered medications on file prior to visit.    Past Medical History:  Diagnosis Date  . ALLERGIC RHINITIS   . Anal skin tag 09/13/2017  . COMMON MIGRAINE   . GLUCOSE INTOLERANCE   . Hx of colonic polyp 09/20/2017  . Hypercalcemia   . HYPERLIPIDEMIA   . HYPERTENSION   . INSOMNIA   . OBESITY   . RENAL INSUFFICIENCY     Past Surgical History:  Procedure Laterality Date  . BREAST SURGERY  1998   Implants  . hypertension post partum  09/2006   Not precalmpsia    Social History   Socioeconomic History  . Marital status: Married    Spouse name: Johnathon  . Number of children: 1  . Years of education: 6  . Highest education level: Not on file  Occupational History  . Occupation: [employer]  Tobacco Use  . Smoking status: Former Smoker    Quit date: 05/09/2005    Years since quitting: 14.7  . Smokeless tobacco: Never Used  . Tobacco comment: Married, lives with spouse and son. 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 Vaping Use: Never used  Substance and Sexual Activity  . Alcohol use: Yes    Comment: social  . Drug use: No  . Sexual activity: Not on file  Other Topics Concern  . Not on file  Social History Narrative   Right handed    Coffee daily   Lives with husband and son. She also takes care of her mother who has Alzheimers   Social Determinants of Health   Financial Resource Strain:   . Difficulty of Paying Living Expenses: Not on file  Food Insecurity:   . Worried About Charity fundraiser in the Last Year: Not on file  . Ran Out of Food in the Last Year: Not on file  Transportation Needs:   . Lack of Transportation (Medical): Not on file  . Lack of Transportation (Non-Medical): Not  on file  Physical Activity:   . Days of Exercise per Week: Not on file  . Minutes of Exercise per Session: Not on file  Stress:   . Feeling of Stress : Not on file  Social Connections:   . Frequency of Communication with Friends and Family: Not on file  . Frequency of Social Gatherings with Friends and Family: Not on file  . Attends Religious Services: Not on file  . Active Member of Clubs or  Organizations: Not on file  . Attends Archivist Meetings: Not on file  . Marital Status: Not on file    Family History  Problem Relation Age of Onset  . Hypertension Mother   . Other Father        MVA  . Diabetes Maternal Grandfather   . Colon cancer Neg Hx   . Stomach cancer Neg Hx   . Rectal cancer Neg Hx     Review of Systems  Constitutional: Negative for chills and fever.  Respiratory: Negative for cough, shortness of breath and wheezing.   Cardiovascular: Positive for palpitations (rare flutter) and leg swelling (mild at end of day). Negative for chest pain.  Neurological: Positive for headaches (occ). Negative for dizziness and light-headedness.       Objective:   Vitals:   01/20/20 0851  BP: 110/82  Pulse: 81  Temp: 98.3 F (36.8 C)  SpO2: 96%   BP Readings from Last 3 Encounters:  01/20/20 110/82  12/18/19 122/82  11/19/19 118/80   Wt Readings from Last 3 Encounters:  01/20/20 204 lb 9.6 oz (92.8 kg)  12/18/19 203 lb (92.1 kg)  11/14/19 204 lb (92.5 kg)   Body mass index is 35.12 kg/m.   Physical Exam    Constitutional: Appears well-developed and well-nourished. No distress.  HENT:  Head: Normocephalic and atraumatic.  Neck: Neck supple. No tracheal deviation present. thyromegaly present.  No cervical lymphadenopathy Cardiovascular: Normal rate, regular rhythm and normal heart sounds.   No murmur heard. No carotid bruit .  No edema Pulmonary/Chest: Effort normal and breath sounds normal. No respiratory distress. No has no wheezes. No rales.    Skin: Skin is warm and dry. Not diaphoretic.  Psychiatric: Normal mood and affect. Behavior is normal.      Assessment & Plan:   Discussed Covid vaccine at length.  She has not been vaccinated, but has had Covid she believes.  See Problem List for Assessment and Plan of chronic medical problems.    This visit occurred during the SARS-CoV-2 public health emergency.  Safety protocols were in place, including screening questions prior to the visit, additional usage of staff PPE, and extensive cleaning of exam room while observing appropriate contact time as indicated for disinfecting solutions.

## 2020-01-19 NOTE — Patient Instructions (Addendum)
°  Blood work was ordered.     Medications reviewed and updated.  Changes include :   Stop the glyburide.  Increase Rybelsus to 7 mg daily.     Your prescription(s) have been submitted to your pharmacy. Please take as directed and contact our office if you believe you are having problem(s) with the medication(s).    Please followup in 6 months

## 2020-01-20 ENCOUNTER — Ambulatory Visit: Payer: BC Managed Care – PPO | Admitting: Internal Medicine

## 2020-01-20 ENCOUNTER — Encounter: Payer: Self-pay | Admitting: Internal Medicine

## 2020-01-20 ENCOUNTER — Other Ambulatory Visit: Payer: Self-pay

## 2020-01-20 VITALS — BP 110/82 | HR 81 | Temp 98.3°F | Wt 204.6 lb

## 2020-01-20 DIAGNOSIS — I10 Essential (primary) hypertension: Secondary | ICD-10-CM

## 2020-01-20 DIAGNOSIS — E7849 Other hyperlipidemia: Secondary | ICD-10-CM | POA: Diagnosis not present

## 2020-01-20 DIAGNOSIS — E119 Type 2 diabetes mellitus without complications: Secondary | ICD-10-CM

## 2020-01-20 DIAGNOSIS — Z6835 Body mass index (BMI) 35.0-35.9, adult: Secondary | ICD-10-CM

## 2020-01-20 DIAGNOSIS — F329 Major depressive disorder, single episode, unspecified: Secondary | ICD-10-CM

## 2020-01-20 DIAGNOSIS — F32A Depression, unspecified: Secondary | ICD-10-CM

## 2020-01-20 DIAGNOSIS — F419 Anxiety disorder, unspecified: Secondary | ICD-10-CM

## 2020-01-20 MED ORDER — ONETOUCH VERIO VI STRP: ORAL_STRIP | 3 refills | Status: AC

## 2020-01-20 MED ORDER — METFORMIN HCL 500 MG PO TABS: 500.0000 mg | ORAL_TABLET | Freq: Two times a day (BID) | ORAL | 1 refills | Status: DC

## 2020-01-20 MED ORDER — CLOTRIMAZOLE-BETAMETHASONE 1-0.05 % EX CREA: 1.0000 "application " | TOPICAL_CREAM | Freq: Two times a day (BID) | CUTANEOUS | 0 refills | Status: DC

## 2020-01-20 MED ORDER — RYBELSUS 7 MG PO TABS: 7.0000 mg | ORAL_TABLET | Freq: Every day | ORAL | 1 refills | Status: DC

## 2020-01-20 NOTE — Assessment & Plan Note (Addendum)
Chronic Sugars well controlled at home Check a1c Low sugar / carb diet Stressed regular exercise Undergoing further evaluation of her thyroid-May need to stop Rybelsus-we will wait await final pathology

## 2020-01-20 NOTE — Assessment & Plan Note (Signed)
Chronic BP well controlled Current regimen effective and well tolerated Continue current medications at current doses cmp  

## 2020-01-20 NOTE — Assessment & Plan Note (Addendum)
Chronic Stressed the importance of regular exercise Discussed diet changes-avoiding sugars and carbs  hopefully Rybelsus will help with weight loss as well as stopping the glyburide

## 2020-01-20 NOTE — Assessment & Plan Note (Addendum)
Chronic Check lipid panel  Continue daily statin-atorvastatin 40 mg daily Continue fenofibrate Regular exercise and healthy diet encouraged

## 2020-01-20 NOTE — Assessment & Plan Note (Signed)
Chronic Controlled, stable Continue current dose of medication Sertraline 50 mg daily

## 2020-01-21 ENCOUNTER — Encounter: Payer: Self-pay | Admitting: Internal Medicine

## 2020-01-21 ENCOUNTER — Ambulatory Visit
Admission: RE | Admit: 2020-01-21 | Discharge: 2020-01-21 | Disposition: A | Discharge status: Home / Self Care | Payer: BC Managed Care – PPO | Source: Ambulatory Visit | Attending: Family | Admitting: Family

## 2020-01-21 DIAGNOSIS — R918 Other nonspecific abnormal finding of lung field: Secondary | ICD-10-CM | POA: Diagnosis not present

## 2020-01-21 DIAGNOSIS — M47814 Spondylosis without myelopathy or radiculopathy, thoracic region: Secondary | ICD-10-CM | POA: Diagnosis not present

## 2020-01-21 DIAGNOSIS — J90 Pleural effusion, not elsewhere classified: Secondary | ICD-10-CM | POA: Diagnosis not present

## 2020-01-21 DIAGNOSIS — Z9882 Breast implant status: Secondary | ICD-10-CM | POA: Diagnosis not present

## 2020-01-21 LAB — COMPLETE METABOLIC PANEL WITH GFR
AG Ratio: 2.2 (calc) (ref 1.0–2.5)
ALT: 29 U/L (ref 6–29)
AST: 21 U/L (ref 10–35)
Albumin: 4.7 g/dL (ref 3.6–5.1)
Alkaline phosphatase (APISO): 42 U/L (ref 37–153)
BUN: 10 mg/dL (ref 7–25)
CO2: 31 mmol/L (ref 20–32)
Calcium: 9.8 mg/dL (ref 8.6–10.4)
Chloride: 100 mmol/L (ref 98–110)
Creat: 0.83 mg/dL (ref 0.50–1.05)
GFR, Est African American: 93 mL/min/{1.73_m2} (ref >=60)
GFR, Est Non African American: 81 mL/min/{1.73_m2} (ref >=60)
Globulin: 2.1 g/dL (calc) (ref 1.9–3.7)
Glucose, Bld: 75 mg/dL (ref 65–99)
Potassium: 3.8 mmol/L (ref 3.5–5.3)
Sodium: 141 mmol/L (ref 135–146)
Total Bilirubin: 0.6 mg/dL (ref 0.2–1.2)
Total Protein: 6.8 g/dL (ref 6.1–8.1)

## 2020-01-21 LAB — LIPID PANEL
Cholesterol: 183 mg/dL (ref <200)
HDL: 40 mg/dL — ABNORMAL LOW (ref >=50)
LDL Cholesterol (Calc): 102 mg/dL (calc) — ABNORMAL HIGH
Non-HDL Cholesterol (Calc): 143 mg/dL (calc) — ABNORMAL HIGH (ref <130)
Total CHOL/HDL Ratio: 4.6 (calc) (ref <5.0)
Triglycerides: 290 mg/dL — ABNORMAL HIGH (ref <150)

## 2020-01-21 LAB — HEMOGLOBIN A1C
Hgb A1c MFr Bld: 7.3 % of total Hgb — ABNORMAL HIGH (ref <5.7)
Mean Plasma Glucose: 163 (calc)
eAG (mmol/L): 9 (calc)

## 2020-01-22 ENCOUNTER — Other Ambulatory Visit: Payer: Self-pay | Admitting: Family

## 2020-01-22 ENCOUNTER — Other Ambulatory Visit: Payer: Self-pay | Admitting: Internal Medicine

## 2020-01-22 ENCOUNTER — Encounter: Payer: Self-pay | Admitting: Family

## 2020-01-22 DIAGNOSIS — J90 Pleural effusion, not elsewhere classified: Secondary | ICD-10-CM

## 2020-01-22 MED ORDER — PENNSAID 2 % EX SOLN
CUTANEOUS | 3 refills | Status: DC
Start: 2020-01-22 — End: 2022-12-28

## 2020-01-23 ENCOUNTER — Encounter (HOSPITAL_COMMUNITY): Payer: Self-pay

## 2020-02-12 DIAGNOSIS — E042 Nontoxic multinodular goiter: Secondary | ICD-10-CM | POA: Diagnosis not present

## 2020-02-12 DIAGNOSIS — D440 Neoplasm of uncertain behavior of thyroid gland: Secondary | ICD-10-CM | POA: Diagnosis not present

## 2020-02-18 ENCOUNTER — Other Ambulatory Visit: Payer: Self-pay | Admitting: Internal Medicine

## 2020-02-27 ENCOUNTER — Ambulatory Visit (INDEPENDENT_AMBULATORY_CARE_PROVIDER_SITE_OTHER): Payer: BC Managed Care – PPO | Admitting: Internal Medicine

## 2020-02-27 ENCOUNTER — Encounter: Payer: Self-pay | Admitting: Internal Medicine

## 2020-02-27 ENCOUNTER — Other Ambulatory Visit: Payer: Self-pay

## 2020-02-27 VITALS — BP 116/90 | HR 89 | Temp 97.7°F | Ht 63.0 in | Wt 202.2 lb

## 2020-02-27 DIAGNOSIS — J90 Pleural effusion, not elsewhere classified: Secondary | ICD-10-CM | POA: Diagnosis not present

## 2020-02-27 NOTE — Progress Notes (Signed)
[name]    [id number]    [birth year]  Primary Care Physician:Burns, Claudina Lick, MD  Referring Physician: Marrian Salvage, [address],  [place] [address] Reason for Consultation: fluid on lungs Date of Consultation: 02/27/2020  Chief complaint:   Chief Complaint  Patient presents with  . Consult    started with cyst in sinus cavity 1 year ago.  fluid in lungs on cxr, took antibiotic, fluid remained.     HPI: The patient is a 54 year old woman who presents for evaluation of fluid on her lungs.  She has history of migraines with return at onset of menopause.  In the work up for her return of migraines had CT sinus but also chest imaging which showed small bilateral pleural effusions.  She describes No chest pain, heaviness or sob.  No fevers chills night sweats or weight loss.She has a remote history of bronchitis many years ago.  No recurrent pulmonary infections, no childhood respiratory disease.  She has no personal history of malignancy.  No family history of lung cancer.  She did take an antibiotic as prescribed for headache practitioner.  There was no improvement in her bilateral pleural effusions.   Social history:  Occupation: works Investment banker, operational.  Exposures: lives at home with husband and son and mom.  Smoking history: former remote smoker.   Social History   Occupational History  . Occupation: [employer]  Tobacco Use  . Smoking status: Former Smoker    Packs/day: 0.25    Years: 8.00    Pack years: 2.00    Types: Cigarettes    Quit date: 05/09/2005    Years since quitting: 14.8  . Smokeless tobacco: Never Used  . Tobacco comment: Married, lives with spouse and son. 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  smoked socially  off and on until she became pregnant.  Vaping Use  . Vaping Use: Never used  Substance and Sexual Activity  . Alcohol use: Yes    Comment: social  . Drug use: No  . Sexual  activity: Not on file    Relevant family history: Family History  Problem Relation Age of Onset  . Hypertension Mother   . Other Father        MVA  . Diabetes Maternal Grandfather   . Asthma Brother   . Rheum arthritis Maternal Aunt   . Colon cancer Neg Hx   . Stomach cancer Neg Hx   . Rectal cancer Neg Hx     Past Medical History:  Diagnosis Date  . ALLERGIC RHINITIS   . Anal skin tag 09/13/2017  . COMMON MIGRAINE   . GLUCOSE INTOLERANCE   . Hx of colonic polyp 09/20/2017  . Hypercalcemia   . HYPERLIPIDEMIA   . HYPERTENSION   . INSOMNIA   . OBESITY   . RENAL INSUFFICIENCY     Past Surgical History:  Procedure Laterality Date  . BREAST SURGERY  1998   Implants  . hypertension post partum  09/2006   Not precalmpsia    Physical Exam: Blood pressure 116/90, pulse 89, temperature 97.7 F (36.5 C), temperature source Temporal, height 5\' 3"  (1.6 m), weight 202 lb 3.2 oz (91.7 kg), SpO2 100 %. Gen:      No acute distress ENT:  no nasal polyps, mucus membranes moist Lungs:    No increased respiratory effort, symmetric chest wall excursion, clear to auscultation bilaterally, no wheezes  or crackles CV:         Regular rate and rhythm; no murmurs, rubs, or gallops.  No pedal edema Abd:      Obese, + bowel sounds; soft, non-tender; no distension MSK: no acute synovitis of DIP or PIP joints, no mechanics hands.  Skin:      Warm and dry; no rashes Neuro: normal speech, no focal facial asymmetry Psych: alert and oriented x3, normal mood and affect  Data Reviewed/Medical Decision Making:  Independent interpretation of tests: Imaging: . Review of patient's CT angio images from a month ago as well as from 2008 reviewed.  She has mild progression of trace bilateral pleural effusions with associated basilar atelectasis.  No PE. The patient's images have been independently reviewed by me.    PFTs: None on file  Labs:  Lab Results  Component Value Date   WBC 6.3 05/06/2019    HGB 14.6 05/06/2019   HCT 43.5 05/06/2019   MCV 91.4 05/06/2019   PLT 242.0 05/06/2019   Lab Results  Component Value Date   NA 141 01/20/2020   K 3.8 01/20/2020   CL 100 01/20/2020   CO2 31 01/20/2020     Immunization status:  Immunization History  Administered Date(s) Administered  . Td 05/19/2008  . Tdap 05/29/2018  . Zoster Recombinat (Shingrix) 05/29/2018, 11/28/2018    . I reviewed prior external note(s) from hospital stay, ED stay, pcp . I reviewed the result(s) of the labs and imaging as noted above.   Assessment:  Trace bilateral pleural effusions  Plan/Recommendations: [name] is a 54 y.o. woman who presents for follow-up of bilateral pleural effusions.  She had these back in 2008 and there has been very mild progression.  At this point she has no personal family history of malignancy or rheumatologic disorder that would cause concern for need to follow.  She does not appear to have the secondary infection.  The most common cause for bilateral pleural effusions would be a cardiac etiology, or untreated hypertension.  She is on several medications for this but it appears well controlled.  At this point the effusions are too small for any kind of intervention such as thoracentesis.  I recommend expectant management.  Since they have been here for 13 years and only minimally progressed I suspect they will not be an issue for her.  Should she develop worsening breathing difficulty I recommend her to return to care and we can evaluate the need for any intervention at that time.  She is in agreement with this plan.  We discussed disease management and progression at length today.   I spent 45 minutes in the care of this patient today including pre-charting, chart review, review of results, face-to-face care, coordination of care and communication with consultants etc.).   Return to Care: Return if symptoms worsen or fail to improve, for shortness of  breath.  Lenice Llamas, MD [facility] [place]  CC: Marrian Salvage,*

## 2020-02-27 NOTE — Patient Instructions (Signed)
Come back and see me if you have difficulty with your breathing.

## 2020-03-08 ENCOUNTER — Encounter: Payer: Self-pay | Admitting: Internal Medicine

## 2020-03-09 ENCOUNTER — Telehealth: Payer: BC Managed Care – PPO | Admitting: Internal Medicine

## 2020-05-12 ENCOUNTER — Other Ambulatory Visit: Payer: Self-pay | Admitting: Internal Medicine

## 2020-05-29 ENCOUNTER — Other Ambulatory Visit: Payer: Self-pay

## 2020-05-31 ENCOUNTER — Encounter: Payer: Self-pay | Admitting: Internal Medicine

## 2020-05-31 DIAGNOSIS — G43909 Migraine, unspecified, not intractable, without status migrainosus: Secondary | ICD-10-CM | POA: Insufficient documentation

## 2020-05-31 NOTE — Patient Instructions (Addendum)
Blood work was ordered.     No immunization administered today.    Medications changes include :   none    Please followup in 6months    Health Maintenance, Female Adopting a healthy lifestyle and getting preventive care are important in promoting health and wellness. Ask your health care provider about:  The right schedule for you to have regular tests and exams.  Things you can do on your own to prevent diseases and keep yourself healthy. What should I know about diet, weight, and exercise? Eat a healthy diet  Eat a diet that includes plenty of vegetables, fruits, low-fat dairy products, and lean protein.  Do not eat a lot of foods that are high in solid fats, added sugars, or sodium.   Maintain a healthy weight Body mass index (BMI) is used to identify weight problems. It estimates body fat based on height and weight. Your health care provider can help determine your BMI and help you achieve or maintain a healthy weight. Get regular exercise Get regular exercise. This is one of the most important things you can do for your health. Most adults should:  Exercise for at least 150 minutes each week. The exercise should increase your heart rate and make you sweat (moderate-intensity exercise).  Do strengthening exercises at least twice a week. This is in addition to the moderate-intensity exercise.  Spend less time sitting. Even light physical activity can be beneficial. Watch cholesterol and blood lipids Have your blood tested for lipids and cholesterol at 54 years of age, then have this test every 5 years. Have your cholesterol levels checked more often if:  Your lipid or cholesterol levels are high.  You are older than 54 years of age.  You are at high risk for heart disease. What should I know about cancer screening? Depending on your health history and family history, you may need to have cancer screening at various ages. This may include screening for:  Breast  cancer.  Cervical cancer.  Colorectal cancer.  Skin cancer.  Lung cancer. What should I know about heart disease, diabetes, and high blood pressure? Blood pressure and heart disease  High blood pressure causes heart disease and increases the risk of stroke. This is more likely to develop in people who have high blood pressure readings, are of African descent, or are overweight.  Have your blood pressure checked: ? Every 3-5 years if you are 18-39 years of age. ? Every year if you are 40 years old or older. Diabetes Have regular diabetes screenings. This checks your fasting blood sugar level. Have the screening done:  Once every three years after age 40 if you are at a normal weight and have a low risk for diabetes.  More often and at a younger age if you are overweight or have a high risk for diabetes. What should I know about preventing infection? Hepatitis B If you have a higher risk for hepatitis B, you should be screened for this virus. Talk with your health care provider to find out if you are at risk for hepatitis B infection. Hepatitis C Testing is recommended for:  Everyone born from 1945 through 1965.  Anyone with known risk factors for hepatitis C. Sexually transmitted infections (STIs)  Get screened for STIs, including gonorrhea and chlamydia, if: ? You are sexually active and are younger than 54 years of age. ? You are older than 54 years of age and your health care provider tells you that you are at   risk for this type of infection. ? Your sexual activity has changed since you were last screened, and you are at increased risk for chlamydia or gonorrhea. Ask your health care provider if you are at risk.  Ask your health care provider about whether you are at high risk for HIV. Your health care provider may recommend a prescription medicine to help prevent HIV infection. If you choose to take medicine to prevent HIV, you should first get tested for HIV. You should  then be tested every 3 months for as long as you are taking the medicine. Pregnancy  If you are about to stop having your period (premenopausal) and you may become pregnant, seek counseling before you get pregnant.  Take 400 to 800 micrograms (mcg) of folic acid every day if you become pregnant.  Ask for birth control (contraception) if you want to prevent pregnancy. Osteoporosis and menopause Osteoporosis is a disease in which the bones lose minerals and strength with aging. This can result in bone fractures. If you are 89 years old or older, or if you are at risk for osteoporosis and fractures, ask your health care provider if you should:  Be screened for bone loss.  Take a calcium or vitamin D supplement to lower your risk of fractures.  Be given hormone replacement therapy (HRT) to treat symptoms of menopause. Follow these instructions at home: Lifestyle  Do not use any products that contain nicotine or tobacco, such as cigarettes, e-cigarettes, and chewing tobacco. If you need help quitting, ask your health care provider.  Do not use street drugs.  Do not share needles.  Ask your health care provider for help if you need support or information about quitting drugs. Alcohol use  Do not drink alcohol if: ? Your health care provider tells you not to drink. ? You are pregnant, may be pregnant, or are planning to become pregnant.  If you drink alcohol: ? Limit how much you use to 0-1 drink a day. ? Limit intake if you are breastfeeding.  Be aware of how much alcohol is in your drink. In the [place] one drink equals one 12 oz bottle of beer (355 mL), one 5 oz glass of wine (148 mL), or one 1 oz glass of hard liquor (44 mL). General instructions  Schedule regular health, dental, and eye exams.  Stay current with your vaccines.  Tell your health care provider if: ? You often feel depressed. ? You have ever been abused or do not feel safe at home. Summary  Adopting a  healthy lifestyle and getting preventive care are important in promoting health and wellness.  Follow your health care provider's instructions about healthy diet, exercising, and getting tested or screened for diseases.  Follow your health care provider's instructions on monitoring your cholesterol and blood pressure. This information is not intended to replace advice given to you by your health care provider. Make sure you discuss any questions you have with your health care provider. Document Revised: 04/18/2018 Document Reviewed: 04/18/2018 Elsevier Patient Education  2021 [employer].

## 2020-05-31 NOTE — Progress Notes (Signed)
Subjective:    Patient ID: [name], female    DOB: 1966-01-09, 55 y.o.   MRN: [id number]   This visit occurred during the SARS-CoV-2 public health emergency.  Safety protocols were in place, including screening questions prior to the visit, additional usage of staff PPE, and extensive cleaning of exam room while observing appropriate contact time as indicated for disinfecting solutions.    HPI She is here for a physical exam.   Sugars at home 108 recently.  She denies lows or highs.   Medications and allergies reviewed with patient and updated if appropriate.  Patient Active Problem List   Diagnosis Date Noted  . BMI 36.0-36.9,adult 06/01/2020  . Migraines 05/31/2020  . Ventral hernia without obstruction or gangrene 11/14/2019  . Hair loss 11/14/2019  . Cervicogenic headache 05/17/2019  . Ganglion cyst of wrist, left 05/29/2018  . Hx of colonic polyp 09/20/2017  . Anal skin tag 09/13/2017  . Anxiety and depression 05/24/2017  . Lateral epicondylitis 05/23/2016  . Diabetes ([place]) 05/23/2016  . Snoring 09/07/2015  . Insomnia 06/02/2009  . Morbid obesity ([place]) 05/19/2008  . Hyperlipidemia 04/17/2008  . Essential hypertension 04/17/2008  . ALLERGIC RHINITIS 04/17/2008    Current Outpatient Medications on File Prior to Visit  Medication Sig Dispense Refill  . amLODipine (NORVASC) 5 MG tablet Take 1 tablet (5 mg total) by mouth daily. 90 tablet 1  . atorvastatin (LIPITOR) 40 MG tablet TAKE 1 TABLET BY MOUTH DAILY 90 tablet 3  . azelastine (ASTELIN) 0.1 % nasal spray azelastine 137 mcg (0.1 %) nasal spray aerosol    . clotrimazole-betamethasone (LOTRISONE) cream Apply 1 application topically 2 (two) times daily. 30 g 0  . Diclofenac Sodium (PENNSAID) 2 % SOLN Pennsaid 20 mg/gram/actuation (2 %) topical soln in metered-dose pump  APPLY ONE PUMP TOPICALLY TO THE AFFECTED AREA TWICE DAILY AS NEEDED 112 g 3  . doxycycline (MONODOX) 50 MG capsule Take 50 mg by mouth 2  (two) times daily.    . fenofibrate (TRICOR) 145 MG tablet TAKE 1 TABLET BY MOUTH DAILY. GENERIC EQUIVALENT FOR TRICOR 90 tablet 1  . Fremanezumab-vfrm (AJOVY) 225 MG/1.5ML SOAJ Inject 225 mg into the skin every 30 (thirty) days. 1.5 mL 11  . glucose blood (ONETOUCH VERIO) test strip Use as instructed to check sugar daily 100 strip 3  . glucose blood test strip OneTouch Verio test strips  TEST 4X DAILY    . labetalol (NORMODYNE) 200 MG tablet TAKE 2 TABLETS BY MOUTH 3 TIMES DAILY. GENERIC EQUIVALENT FOR NORMODYNE. 540 tablet 1  . metFORMIN (GLUCOPHAGE) 500 MG tablet Take 1 tablet (500 mg total) by mouth 2 (two) times daily with a meal. 180 tablet 1  . Olopatadine HCl (PAZEO) 0.7 % SOLN Pazeo 0.7 % eye drops  INSTILL 1 DROP INTO BOTH EYES ONCE A DAY AS NEEDED    . OneTouch Delica Lancets 81E MISC Apply topically 4 (four) times daily.    . RESTASIS 0.05 % ophthalmic emulsion INSTILL 1 DROP INTO BOTH EYES TWICE A DAY INSTILL 1 DROP INTO BOTH EYES TWICE DAILY    . Rimegepant Sulfate (NURTEC) 75 MG TBDP Take 75 mg by mouth daily as needed (MIGRAINE). Max of 1 tablet per day. 10 tablet 10  . Semaglutide (RYBELSUS) 7 MG TABS Take 7 mg by mouth daily. 90 tablet 1  . sertraline (ZOLOFT) 50 MG tablet TAKE 1 TABLET BY MOUTH EVERYDAY AT BEDTIME 90 tablet 1  . triamcinolone cream (KENALOG) 0.1 %  SMARTSIG:1 Application Topical 2-3 Times Daily    . triamterene-hydrochlorothiazide (DYAZIDE) 37.5-25 MG capsule TAKE 1 CAPSULE BY MOUTH DAILY 90 capsule 3   No current facility-administered medications on file prior to visit.    Past Medical History:  Diagnosis Date  . ALLERGIC RHINITIS   . Anal skin tag 09/13/2017  . COMMON MIGRAINE   . GLUCOSE INTOLERANCE   . Hx of colonic polyp 09/20/2017  . Hypercalcemia   . HYPERLIPIDEMIA   . HYPERTENSION   . INSOMNIA   . OBESITY   . RENAL INSUFFICIENCY     Past Surgical History:  Procedure Laterality Date  . BREAST SURGERY  1998   Implants  . hypertension post  partum  09/2006   Not precalmpsia    Social History   Socioeconomic History  . Marital status: Married    Spouse name: Johnathon  . Number of children: 1  . Years of education: 70  . Highest education level: Not on file  Occupational History  . Occupation: [employer]  Tobacco Use  . Smoking status: Former Smoker    Packs/day: 0.25    Years: 8.00    Pack years: 2.00    Types: Cigarettes    Quit date: 05/09/2005    Years since quitting: 15.0  . Smokeless tobacco: Never Used  . Tobacco comment: Married, lives with spouse and son. 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  smoked socially  off and on until she became pregnant.  Vaping Use  . Vaping Use: Never used  Substance and Sexual Activity  . Alcohol use: Yes    Comment: social  . Drug use: No  . Sexual activity: Not on file  Other Topics Concern  . Not on file  Social History Narrative   Right handed    Coffee daily   Lives with husband and son. She also takes care of her mother who has Alzheimers   Social Determinants of Radio broadcast assistant Strain: Not on file  Food Insecurity: Not on file  Transportation Needs: Not on file  Physical Activity: Not on file  Stress: Not on file  Social Connections: Not on file    Family History  Problem Relation Age of Onset  . Hypertension Mother   . Dementia Mother   . Other Father        MVA  . Diabetes Maternal Grandfather   . Asthma Brother   . Rheum arthritis Maternal Aunt   . Colon cancer Neg Hx   . Stomach cancer Neg Hx   . Rectal cancer Neg Hx     Review of Systems  Constitutional: Negative for chills and fever.  Eyes: Negative for visual disturbance.  Respiratory: Negative for cough, shortness of breath and wheezing.   Cardiovascular: Positive for palpitations (occ). Negative for chest pain. Leg swelling: occ - if on feet long time.  Gastrointestinal: Negative for abdominal pain, blood in stool, constipation, diarrhea and  nausea.       No gerd  Genitourinary: Negative for dysuria and hematuria.  Musculoskeletal: Positive for arthralgias (R knee). Negative for back pain.  Skin: Negative for color change and rash.  Neurological: Positive for headaches (occ). Negative for light-headedness and numbness.  Psychiatric/Behavioral: Positive for dysphoric mood and sleep disturbance (intermittent). The patient is nervous/anxious.        Objective:   Vitals:   06/01/20 0849  BP: 126/82  Pulse: 82  Temp: 98.1 F (36.7 C)  SpO2: 97%  Filed Weights   06/01/20 0849  Weight: 206 lb (93.4 kg)   Body mass index is 36.49 kg/m.  BP Readings from Last 3 Encounters:  06/01/20 126/82  02/27/20 116/90  01/20/20 110/82    Wt Readings from Last 3 Encounters:  06/01/20 206 lb (93.4 kg)  02/27/20 202 lb 3.2 oz (91.7 kg)  01/20/20 204 lb 9.6 oz (92.8 kg)     Physical Exam Constitutional: She appears well-developed and well-nourished. No distress.  HENT:  Head: Normocephalic and atraumatic.  Right Ear: External ear normal. Normal ear canal and TM Left Ear: External ear normal.  Normal ear canal and TM Mouth/Throat: Oropharynx is clear and moist.  Eyes: Conjunctivae and EOM are normal.  Neck: Neck supple. No tracheal deviation present. No thyromegaly present.  No carotid bruit  Cardiovascular: Normal rate, regular rhythm and normal heart sounds.   No murmur heard.  No edema. Pulmonary/Chest: Effort normal and breath sounds normal. No respiratory distress. She has no wheezes. She has no rales.  Breast: deferred   Abdominal: Soft. She exhibits no distension. There is no tenderness.  Lymphadenopathy: She has no cervical adenopathy.  Skin: Skin is warm and dry. She is not diaphoretic.  Psychiatric: She has a normal mood and affect. Her behavior is normal.        Assessment & Plan:   Physical exam: Screening blood work    ordered Immunizations  deferred covid vaccines Colonoscopy  Up to date  Mammogram   Up to date  Gyn  Dr Marvel Plan, up to date Eye exams  Due - will schedule Exercise   None - injured knee - will resume when able Weight  discussed weight loss Substance abuse        See Problem List for Assessment and Plan of chronic medical problems.

## 2020-06-01 ENCOUNTER — Ambulatory Visit (INDEPENDENT_AMBULATORY_CARE_PROVIDER_SITE_OTHER): Payer: BC Managed Care – PPO | Admitting: Internal Medicine

## 2020-06-01 ENCOUNTER — Other Ambulatory Visit: Payer: Self-pay

## 2020-06-01 ENCOUNTER — Encounter: Payer: Self-pay | Admitting: Internal Medicine

## 2020-06-01 VITALS — BP 126/82 | HR 82 | Temp 98.1°F | Ht 63.0 in | Wt 206.0 lb

## 2020-06-01 DIAGNOSIS — Z0000 Encounter for general adult medical examination without abnormal findings: Secondary | ICD-10-CM

## 2020-06-01 DIAGNOSIS — E1165 Type 2 diabetes mellitus with hyperglycemia: Secondary | ICD-10-CM

## 2020-06-01 DIAGNOSIS — F419 Anxiety disorder, unspecified: Secondary | ICD-10-CM

## 2020-06-01 DIAGNOSIS — Z6835 Body mass index (BMI) 35.0-35.9, adult: Secondary | ICD-10-CM | POA: Diagnosis not present

## 2020-06-01 DIAGNOSIS — F32A Depression, unspecified: Secondary | ICD-10-CM

## 2020-06-01 DIAGNOSIS — G4709 Other insomnia: Secondary | ICD-10-CM

## 2020-06-01 DIAGNOSIS — Z6836 Body mass index (BMI) 36.0-36.9, adult: Secondary | ICD-10-CM | POA: Insufficient documentation

## 2020-06-01 DIAGNOSIS — I10 Essential (primary) hypertension: Secondary | ICD-10-CM

## 2020-06-01 DIAGNOSIS — E7849 Other hyperlipidemia: Secondary | ICD-10-CM

## 2020-06-01 LAB — CBC WITH DIFFERENTIAL/PLATELET
Basophils Absolute: 0.1 10*3/uL (ref 0.0–0.1)
Basophils Relative: 0.9 % (ref 0.0–3.0)
Eosinophils Absolute: 0.6 10*3/uL (ref 0.0–0.7)
Eosinophils Relative: 9.4 % — ABNORMAL HIGH (ref 0.0–5.0)
HCT: 40.2 % (ref 36.0–46.0)
Hemoglobin: 13.7 g/dL (ref 12.0–15.0)
Lymphocytes Relative: 41.3 % (ref 12.0–46.0)
Lymphs Abs: 2.7 10*3/uL (ref 0.7–4.0)
MCHC: 34 g/dL (ref 30.0–36.0)
MCV: 90.1 fl (ref 78.0–100.0)
Monocytes Absolute: 0.5 10*3/uL (ref 0.1–1.0)
Monocytes Relative: 7.9 % (ref 3.0–12.0)
Neutro Abs: 2.6 10*3/uL (ref 1.4–7.7)
Neutrophils Relative %: 40.5 % — ABNORMAL LOW (ref 43.0–77.0)
Platelets: 243 10*3/uL (ref 150.0–400.0)
RBC: 4.47 Mil/uL (ref 3.87–5.11)
RDW: 12.8 % (ref 11.5–15.5)
WBC: 6.5 10*3/uL (ref 4.0–10.5)

## 2020-06-01 LAB — HEMOGLOBIN A1C: Hgb A1c MFr Bld: 6.3 % (ref 4.6–6.5)

## 2020-06-01 LAB — COMPREHENSIVE METABOLIC PANEL
ALT: 38 U/L — ABNORMAL HIGH (ref 0–35)
AST: 27 U/L (ref 0–37)
Albumin: 4.5 g/dL (ref 3.5–5.2)
Alkaline Phosphatase: 33 U/L — ABNORMAL LOW (ref 39–117)
BUN: 14 mg/dL (ref 6–23)
CO2: 29 mEq/L (ref 19–32)
Calcium: 10.5 mg/dL (ref 8.4–10.5)
Chloride: 105 mEq/L (ref 96–112)
Creatinine, Ser: 0.94 mg/dL (ref 0.40–1.20)
GFR: 69.26 mL/min (ref >60.00)
Glucose, Bld: 124 mg/dL — ABNORMAL HIGH (ref 70–99)
Potassium: 4.2 mEq/L (ref 3.5–5.1)
Sodium: 142 mEq/L (ref 135–145)
Total Bilirubin: 0.5 mg/dL (ref 0.2–1.2)
Total Protein: 6.9 g/dL (ref 6.0–8.3)

## 2020-06-01 LAB — LIPID PANEL
Cholesterol: 160 mg/dL (ref 0–200)
HDL: 37.9 mg/dL — ABNORMAL LOW (ref >39.00)
NonHDL: 122.48
Total CHOL/HDL Ratio: 4
Triglycerides: 240 mg/dL — ABNORMAL HIGH (ref 0.0–149.0)
VLDL: 48 mg/dL — ABNORMAL HIGH (ref 0.0–40.0)

## 2020-06-01 LAB — TSH: TSH: 1.35 u[IU]/mL (ref 0.35–4.50)

## 2020-06-01 LAB — LDL CHOLESTEROL, DIRECT: Direct LDL: 104 mg/dL

## 2020-06-01 NOTE — Assessment & Plan Note (Signed)
Chronic Check lipid panel , cmp, tsh Continue atorvastatin 40 mg daily Regular exercise and healthy diet encouraged

## 2020-06-01 NOTE — Assessment & Plan Note (Signed)
Chronic BP well controlled Continue labetalol 400 mg TID, amlodipine 5 mg daily, dyazide 37.5 - 25 mg  cmp

## 2020-06-01 NOTE — Assessment & Plan Note (Signed)
Chronic Controlled, stable Continue sertraline 50 mg daily  

## 2020-06-01 NOTE — Assessment & Plan Note (Signed)
Chronic BMI 36.49 with co-morbidities of DM, htn, hyperlipidemia Will start exercising when her knee is better Continue diabetic diet, decreased portions

## 2020-06-01 NOTE — Assessment & Plan Note (Signed)
Chronic Intermittent Taking benadryl prn - continue - advised not to take too regularly

## 2020-06-01 NOTE — Assessment & Plan Note (Signed)
Chronic Check a1c Sugars controlled at home Will start exercising once knee is better Diabetic diet Continue metformin 500 mg BID, rybelsus 7 mg daily -- will titrate if needed

## 2020-06-17 ENCOUNTER — Other Ambulatory Visit: Payer: Self-pay | Admitting: Internal Medicine

## 2020-06-19 ENCOUNTER — Other Ambulatory Visit: Payer: Self-pay | Admitting: Internal Medicine

## 2020-07-03 ENCOUNTER — Other Ambulatory Visit: Payer: Self-pay | Admitting: Internal Medicine

## 2020-07-15 ENCOUNTER — Other Ambulatory Visit: Payer: Self-pay | Admitting: Internal Medicine

## 2020-07-28 ENCOUNTER — Encounter: Payer: Self-pay | Admitting: Internal Medicine

## 2020-07-29 ENCOUNTER — Other Ambulatory Visit: Payer: Self-pay | Admitting: Internal Medicine

## 2020-07-29 DIAGNOSIS — I10 Essential (primary) hypertension: Secondary | ICD-10-CM

## 2020-07-30 NOTE — Telephone Encounter (Signed)
Forms have been completed and Placed in providers box to review and sign.

## 2020-07-31 DIAGNOSIS — Z0279 Encounter for issue of other medical certificate: Secondary | ICD-10-CM

## 2020-07-31 NOTE — Telephone Encounter (Signed)
Forms have been completed and faxed, Copy sent to scan &Charged for   Patient informed via mychart.

## 2020-08-11 ENCOUNTER — Other Ambulatory Visit: Payer: Self-pay | Admitting: Internal Medicine

## 2020-10-28 ENCOUNTER — Other Ambulatory Visit: Payer: Self-pay | Admitting: Internal Medicine

## 2020-11-25 ENCOUNTER — Telehealth: Payer: Self-pay

## 2020-11-25 NOTE — Telephone Encounter (Signed)
Notified pt that it is fine for her to come to visit on 11/30/20 as long as she does not develop new symptoms or her symptoms return.  Pt verb understanding.

## 2020-11-25 NOTE — Telephone Encounter (Signed)
I have spoken with the pt and she has stated she taken an at home COVID test due to some sxs she was having that started 11/16/2020. Pt tested POS on 11/18/2020. Her aptmnt is on 11/30/2020 with [facility]. Pt is inquiring that since her 10 day quarantine will be over on Saturday is it ok for her to come in still on 11/30/2020.  **Pt was asked about any current sxs and she has denied any. However, she has stated she has allergies and sinus issues that are her "norm".  Sent to [facility] to advise.

## 2020-11-28 NOTE — Progress Notes (Signed)
Subjective:    Patient ID: [name], female    DOB: [birth year], 55 y.o.   MRN: [id number]  HPI The patient is here for follow up of their chronic medical problems, including DM, htn, hichol, anxiety, depression   Having increased right knee pain.  We discussed this at her previous appointment, but it has gotten worse.  Diet - slacked off recently, but getting back into it.   Getting back into exercise.   Eye appt in Sept  Medications and allergies reviewed with patient and updated if appropriate.  Patient Active Problem List   Diagnosis Date Noted   Right knee pain 11/30/2020   BMI 36.0-36.9,adult 06/01/2020   Migraines 05/31/2020   Ventral hernia without obstruction or gangrene 11/14/2019   Hair loss 11/14/2019   Cervicogenic headache 05/17/2019   Ganglion cyst of wrist, left 05/29/2018   Hx of colonic polyp 09/20/2017   Anal skin tag 09/13/2017   Anxiety and depression 05/24/2017   Lateral epicondylitis 05/23/2016   Diabetes ([place]) 05/23/2016   Snoring 09/07/2015   Insomnia 06/02/2009   Morbid obesity ([place]) 05/19/2008   Hyperlipidemia 04/17/2008   Essential hypertension 04/17/2008   ALLERGIC RHINITIS 04/17/2008    Current Outpatient Medications on File Prior to Visit  Medication Sig Dispense Refill   amLODipine (NORVASC) 5 MG tablet TAKE 1 TABLET BY MOUTH EVERY DAY 90 tablet 1   atorvastatin (LIPITOR) 40 MG tablet TAKE 1 TABLET BY MOUTH DAILY 90 tablet 3   azelastine (ASTELIN) 0.1 % nasal spray azelastine 137 mcg (0.1 %) nasal spray aerosol     ciclopirox (LOPROX) 0.77 % cream APPLY TO AFFECTED AREA TWICE A DAY     clotrimazole-betamethasone (LOTRISONE) cream APPLY TO AFFECTED AREA TWICE A DAY 30 g 0   Diclofenac Sodium (PENNSAID) 2 % SOLN Pennsaid 20 mg/gram/actuation (2 %) topical soln in metered-dose pump  APPLY ONE PUMP TOPICALLY TO THE AFFECTED AREA TWICE DAILY AS NEEDED 112 g 3   doxycycline (MONODOX) 50 MG capsule Take 50 mg by mouth 2 (two) times  daily.     fenofibrate (TRICOR) 145 MG tablet TAKE 1 TABLET BY MOUTH DAILY. GENERIC EQUIVALENT FOR TRICOR 90 tablet 1   Fremanezumab-vfrm (AJOVY) 225 MG/1.5ML SOAJ Inject 225 mg into the skin every 30 (thirty) days. 1.5 mL 11   glucose blood (ONETOUCH VERIO) test strip Use as instructed to check sugar daily 100 strip 3   glucose blood test strip OneTouch Verio test strips  TEST 4X DAILY     labetalol (NORMODYNE) 200 MG tablet TAKE 2 TABLETS BY MOUTH 3 TIMES DAILY. GENERIC EQUIVALENT FOR NORMODYNE. 540 tablet 1   metFORMIN (GLUCOPHAGE) 500 MG tablet TAKE 1 TABLET (500 MG TOTAL) BY MOUTH 2 (TWO) TIMES DAILY WITH A MEAL. 180 tablet 0   Olopatadine HCl (PAZEO) 0.7 % SOLN Pazeo 0.7 % eye drops  INSTILL 1 DROP INTO BOTH EYES ONCE A DAY AS NEEDED     OneTouch Delica Lancets 99991111 MISC Apply topically 4 (four) times daily.     RESTASIS 0.05 % ophthalmic emulsion INSTILL 1 DROP INTO BOTH EYES TWICE A DAY INSTILL 1 DROP INTO BOTH EYES TWICE DAILY     Rimegepant Sulfate (NURTEC) 75 MG TBDP Take 75 mg by mouth daily as needed (MIGRAINE). Max of 1 tablet per day. 10 tablet 10   RYBELSUS 7 MG TABS TAKE 7 MG BY MOUTH DAILY. 90 tablet 1   sertraline (ZOLOFT) 50 MG tablet TAKE 1 TABLET BY MOUTH EVERYDAY  AT BEDTIME 90 tablet 1   triamcinolone cream (KENALOG) 0.1 % SMARTSIG:1 Application Topical 2-3 Times Daily     triamterene-hydrochlorothiazide (DYAZIDE) 37.5-25 MG capsule TAKE 1 CAPSULE BY MOUTH DAILY 90 capsule 3   No current facility-administered medications on file prior to visit.    Past Medical History:  Diagnosis Date   ALLERGIC RHINITIS    Anal skin tag 09/13/2017   COMMON MIGRAINE    GLUCOSE INTOLERANCE    Hx of colonic polyp 09/20/2017   Hypercalcemia    HYPERLIPIDEMIA    HYPERTENSION    INSOMNIA    OBESITY    RENAL INSUFFICIENCY     Past Surgical History:  Procedure Laterality Date   BREAST SURGERY  1998   Implants   hypertension post partum  09/2006   Not precalmpsia    Social  History   Socioeconomic History   Marital status: Married    Spouse name: Johnathon   Number of children: 1   Years of education: 14   Highest education level: Not on file  Occupational History   Occupation: [employer]  Tobacco Use   Smoking status: Former    Packs/day: 0.25    Years: 8.00    Pack years: 2.00    Types: Cigarettes    Quit date: 05/09/2005    Years since quitting: 15.5   Smokeless tobacco: Never   Tobacco comments:    Married, lives with spouse and son. 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  smoked socially  off and on until she became pregnant.  Vaping Use   Vaping Use: Never used  Substance and Sexual Activity   Alcohol use: Yes    Comment: social   Drug use: No   Sexual activity: Not on file  Other Topics Concern   Not on file  Social History Narrative   Right handed    Coffee daily   Lives with husband and son. She also takes care of her mother who has Alzheimers   Social Determinants of Radio broadcast assistant Strain: Not on file  Food Insecurity: Not on file  Transportation Needs: Not on file  Physical Activity: Not on file  Stress: Not on file  Social Connections: Not on file    Family History  Problem Relation Age of Onset   Hypertension Mother    Dementia Mother    Other Father        MVA   Diabetes Maternal Grandfather    Asthma Brother    Rheum arthritis Maternal Aunt    Colon cancer Neg Hx    Stomach cancer Neg Hx    Rectal cancer Neg Hx     Review of Systems  Constitutional:  Negative for chills and fever.  Respiratory:  Negative for cough, shortness of breath and wheezing.   Cardiovascular:  Negative for chest pain, palpitations and leg swelling.  Musculoskeletal:  Positive for arthralgias.  Neurological:  Positive for headaches. Negative for light-headedness.      Objective:   Vitals:   11/30/20 0829  BP: 124/78  Pulse: 83  Temp: 98.1 F (36.7 C)  SpO2: 97%   BP Readings from Last 3  Encounters:  11/30/20 124/78  06/01/20 126/82  02/27/20 116/90   Wt Readings from Last 3 Encounters:  11/30/20 206 lb (93.4 kg)  06/01/20 206 lb (93.4 kg)  02/27/20 202 lb 3.2 oz (91.7 kg)   Body mass index is 36.49 kg/m.      Physical  Exam    Constitutional: Appears well-developed and well-nourished. No distress.  HENT:  Head: Normocephalic and atraumatic.  Neck: Neck supple. No tracheal deviation present. No thyromegaly present.  No cervical lymphadenopathy Cardiovascular: Normal rate, regular rhythm and normal heart sounds.   No murmur heard. No carotid bruit .  No edema Pulmonary/Chest: Effort normal and breath sounds normal. No respiratory distress. No has no wheezes. No rales.  Skin: Skin is warm and dry. Not diaphoretic.  Psychiatric: Normal mood and affect. Behavior is normal.      Assessment & Plan:    See Problem List for Assessment and Plan of chronic medical problems.    This visit occurred during the SARS-CoV-2 public health emergency.  Safety protocols were in place, including screening questions prior to the visit, additional usage of staff PPE, and extensive cleaning of exam room while observing appropriate contact time as indicated for disinfecting solutions.

## 2020-11-30 ENCOUNTER — Encounter: Payer: Self-pay | Admitting: Internal Medicine

## 2020-11-30 ENCOUNTER — Other Ambulatory Visit: Payer: Self-pay

## 2020-11-30 ENCOUNTER — Ambulatory Visit: Payer: BC Managed Care – PPO | Admitting: Internal Medicine

## 2020-11-30 VITALS — BP 124/78 | HR 83 | Temp 98.1°F | Ht 63.0 in | Wt 206.0 lb

## 2020-11-30 DIAGNOSIS — E1165 Type 2 diabetes mellitus with hyperglycemia: Secondary | ICD-10-CM | POA: Diagnosis not present

## 2020-11-30 DIAGNOSIS — I10 Essential (primary) hypertension: Secondary | ICD-10-CM

## 2020-11-30 DIAGNOSIS — F32A Depression, unspecified: Secondary | ICD-10-CM

## 2020-11-30 DIAGNOSIS — M25561 Pain in right knee: Secondary | ICD-10-CM

## 2020-11-30 DIAGNOSIS — F419 Anxiety disorder, unspecified: Secondary | ICD-10-CM

## 2020-11-30 DIAGNOSIS — G8929 Other chronic pain: Secondary | ICD-10-CM

## 2020-11-30 DIAGNOSIS — E7849 Other hyperlipidemia: Secondary | ICD-10-CM | POA: Diagnosis not present

## 2020-11-30 LAB — HEMOGLOBIN A1C: Hgb A1c MFr Bld: 6.6 % — ABNORMAL HIGH (ref 4.6–6.5)

## 2020-11-30 LAB — COMPREHENSIVE METABOLIC PANEL
ALT: 34 U/L (ref 0–35)
AST: 23 U/L (ref 0–37)
Albumin: 4.4 g/dL (ref 3.5–5.2)
Alkaline Phosphatase: 32 U/L — ABNORMAL LOW (ref 39–117)
BUN: 14 mg/dL (ref 6–23)
CO2: 30 mEq/L (ref 19–32)
Calcium: 9.7 mg/dL (ref 8.4–10.5)
Chloride: 104 mEq/L (ref 96–112)
Creatinine, Ser: 0.9 mg/dL (ref 0.40–1.20)
GFR: 72.71 mL/min (ref >60.00)
Glucose, Bld: 103 mg/dL — ABNORMAL HIGH (ref 70–99)
Potassium: 3.4 mEq/L — ABNORMAL LOW (ref 3.5–5.1)
Sodium: 143 mEq/L (ref 135–145)
Total Bilirubin: 0.4 mg/dL (ref 0.2–1.2)
Total Protein: 6.6 g/dL (ref 6.0–8.3)

## 2020-11-30 LAB — MICROALBUMIN / CREATININE URINE RATIO
Creatinine,U: 105.1 mg/dL
Microalb Creat Ratio: 0.7 mg/g (ref 0.0–30.0)
Microalb, Ur: <0.7 mg/dL (ref 0.0–1.9)

## 2020-11-30 LAB — LIPID PANEL
Cholesterol: 160 mg/dL (ref 0–200)
HDL: 31.7 mg/dL — ABNORMAL LOW (ref >39.00)
NonHDL: 128.57
Total CHOL/HDL Ratio: 5
Triglycerides: 318 mg/dL — ABNORMAL HIGH (ref 0.0–149.0)
VLDL: 63.6 mg/dL — ABNORMAL HIGH (ref 0.0–40.0)

## 2020-11-30 LAB — LDL CHOLESTEROL, DIRECT: Direct LDL: 101 mg/dL

## 2020-11-30 NOTE — Assessment & Plan Note (Addendum)
Chronic Lab Results  Component Value Date   HGBA1C 6.3 06/01/2020   Well-controlled Currently taking Rybelsus 7 mg daily 500 mg twice daily Discussed increasing Rybelsus and decreasing metformin--we will see what her A1c is She is getting back into regular exercise and limiting sugars/carbs

## 2020-11-30 NOTE — Patient Instructions (Addendum)
    Blood work was ordered.      Medications changes include :   none     Please followup in 6 months  

## 2020-11-30 NOTE — Assessment & Plan Note (Signed)
Chronic Check lipid panel Triglycerides have been elevated-?  Tricor helping or not Continue atorvastatin 40 mg daily and Tricor 145 mg daily Discussed possibly stopping Tricor to see if it is helping or not-we will see what blood work shows [employer] and regular exercise along with weight loss discussed

## 2020-11-30 NOTE — Assessment & Plan Note (Addendum)
Chronic x months Sudden onset of pain Getting worse - pain all day Wears brace at times Swells intermittently Wants to have it further evaluated-referral ordered for orthopedics

## 2020-11-30 NOTE — Assessment & Plan Note (Signed)
Chronic BMI 36.49 with comorbidities of hypertension, diabetes She has been working on weight loss, but has not been eating well and exercising regularly recently but, getting back into it She understands the importance of decrease portions, healthy diet and regular exercise

## 2020-11-30 NOTE — Assessment & Plan Note (Signed)
Chronic Controlled, stable Continue sertraline 50 mg daily  

## 2020-11-30 NOTE — Assessment & Plan Note (Signed)
Chronic BP well controlled Continue amlodipine 5 mg daily, labetalol 400 mg 3 times daily, Dyazide 37.5-25 mg daily cmp

## 2020-12-01 ENCOUNTER — Encounter: Payer: Self-pay | Admitting: Family Medicine

## 2020-12-01 ENCOUNTER — Ambulatory Visit: Payer: BC Managed Care – PPO | Admitting: Family Medicine

## 2020-12-01 ENCOUNTER — Ambulatory Visit: Payer: Self-pay

## 2020-12-01 DIAGNOSIS — M25561 Pain in right knee: Secondary | ICD-10-CM

## 2020-12-01 MED ORDER — NABUMETONE 500 MG PO TABS: 500.0000 mg | ORAL_TABLET | Freq: Two times a day (BID) | ORAL | 3 refills | Status: DC | PRN

## 2020-12-01 NOTE — Progress Notes (Signed)
Office Visit Note   Patient: [name]           Date of Birth: 1965/12/20           MRN: [id number] Visit Date: 12/01/2020 Requested by: Binnie Rail, MD [place],  [place] [id number] PCP: Binnie Rail, MD  Subjective: Chief Complaint  Patient presents with   Right Knee - Pain    Pain in the knee x 1 year. Started when she knelt down - felt a sharp pain that made her immediately stand back up straight. Has not been much of a problem until 2 months ago. Swells and knee feels tight. Aches/throbs all the time. Worsens if her knee is completely straight when lying down.     HPI: She is here with right knee pain.  Symptoms started about a year ago.  She remembers squatting down and feeling a severe sharp pain.  The knee swelled and hurt quite a bit for several days but then it seemed to calm down.  In the past couple months it has started to bother her on a consistent basis.  It aches all of the time, it swells by the end of the day.  She has had some sharp pains but no locking.  She has tried over-the-counter remedies with no improvement.  She tried a hinged knee brace which helped her walk a little bit better.  The pain makes it difficult to sleep at night.  She cannot fully extend the knee while sleeping.                ROS:   All other systems were reviewed and are negative.  Objective: Vital Signs: There were no vitals taken for this visit.  Physical Exam:  General:  Alert and oriented, in no acute distress. Pulm:  Breathing unlabored. Psy:  Normal mood, congruent affect. Skin: No erythema or warmth Right knee: 1+ effusion.  No significant patellofemoral crepitus.  Negative patella compression, negative patellar apprehension.  Lachman's feels solid.  No laxity of varus or valgus stress.  She does have some tenderness on the posterior medial and posterior lateral joint lines but no palpable click McMurray's.  No palpable popliteal  cyst.    Imaging: X-rays reveal early patellofemoral spurring, medial compartment narrowing mild to moderate, with periarticular spurring in the medial compartment.  No sign of OCD or loose body.    Assessment & Plan: Right knee pain, possibilities include aggravation of arthritis versus meniscus pathology. -We discussed options and elected to try Relafen, glucosamine and turmeric.  If symptoms persist we could do a one-time cortisone injection prior to MRI scan followed by arthroscopic debridement if indicated.     Procedures: No procedures performed        PMFS History: Patient Active Problem List   Diagnosis Date Noted   Right knee pain 11/30/2020   BMI 36.0-36.9,adult 06/01/2020   Migraines 05/31/2020   Ventral hernia without obstruction or gangrene 11/14/2019   Hair loss 11/14/2019   Cervicogenic headache 05/17/2019   Ganglion cyst of wrist, left 05/29/2018   Hx of colonic polyp 09/20/2017   Anal skin tag 09/13/2017   Anxiety and depression 05/24/2017   Lateral epicondylitis 05/23/2016   Diabetes ([place]) 05/23/2016   Snoring 09/07/2015   Insomnia 06/02/2009   Morbid obesity ([place]) 05/19/2008   Hyperlipidemia 04/17/2008   Essential hypertension 04/17/2008   ALLERGIC RHINITIS 04/17/2008   Past Medical History:  Diagnosis Date   ALLERGIC RHINITIS  Anal skin tag 09/13/2017   COMMON MIGRAINE    GLUCOSE INTOLERANCE    Hx of colonic polyp 09/20/2017   Hypercalcemia    HYPERLIPIDEMIA    HYPERTENSION    INSOMNIA    OBESITY    RENAL INSUFFICIENCY     Family History  Problem Relation Age of Onset   Hypertension Mother    Dementia Mother    Other Father        MVA   Diabetes Maternal Grandfather    Asthma Brother    Rheum arthritis Maternal Aunt    Colon cancer Neg Hx    Stomach cancer Neg Hx    Rectal cancer Neg Hx     Past Surgical History:  Procedure Laterality Date   BREAST SURGERY  1998   Implants   hypertension post partum  09/2006   Not  precalmpsia   Social History   Occupational History   Occupation: [employer]  Tobacco Use   Smoking status: Former    Packs/day: 0.25    Years: 8.00    Pack years: 2.00    Types: Cigarettes    Quit date: 05/09/2005    Years since quitting: 15.5   Smokeless tobacco: Never   Tobacco comments:    Married, lives with spouse and son. 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  smoked socially  off and on until she became pregnant.  Vaping Use   Vaping Use: Never used  Substance and Sexual Activity   Alcohol use: Yes    Comment: social   Drug use: No   Sexual activity: Not on file

## 2020-12-04 ENCOUNTER — Other Ambulatory Visit: Payer: Self-pay | Admitting: Internal Medicine

## 2020-12-15 ENCOUNTER — Encounter: Payer: Self-pay | Admitting: Family Medicine

## 2020-12-15 ENCOUNTER — Other Ambulatory Visit: Payer: Self-pay

## 2020-12-15 ENCOUNTER — Ambulatory Visit: Payer: BC Managed Care – PPO | Admitting: Family Medicine

## 2020-12-15 ENCOUNTER — Ambulatory Visit (INDEPENDENT_AMBULATORY_CARE_PROVIDER_SITE_OTHER): Payer: BC Managed Care – PPO | Admitting: Family Medicine

## 2020-12-15 DIAGNOSIS — M25561 Pain in right knee: Secondary | ICD-10-CM | POA: Diagnosis not present

## 2020-12-15 NOTE — Progress Notes (Signed)
Right knee pain. States the Relafen unfortunately isn't helpful.

## 2020-12-15 NOTE — Progress Notes (Signed)
Office Visit Note   Patient: [name]           Date of Birth: [birth year]           MRN: [id number] Visit Date: 12/15/2020 Requested by: Binnie Rail, MD [place],  [place] [id number] PCP: Binnie Rail, MD  Subjective: Chief Complaint  Patient presents with   Right Knee - Pain    HPI: She is here with worsening right knee pain.  She is not able to fully extend her knee, and can only flex it 90 degrees.  It is very uncomfortable on the anterior medial aspect.                ROS:   All other systems were reviewed and are negative.  Objective: Vital Signs: There were no vitals taken for this visit.  Physical Exam:  General:  Alert and oriented, in no acute distress. Pulm:  Breathing unlabored. Psy:  Normal mood, congruent affect.  Right knee: She has about 3 degrees from full extension compared to the left.  Flexion motion is 85 degrees today.  Trace effusion with no warmth or erythema.  Very tender on the medial joint line.  Imaging: No results found.  Assessment & Plan: Worsening right knee pain, suspicious for meniscus tear. -Discussed options and elected to inject with cortisone.  Proceed with MRI scan, surgical consult if indicated and if no improvement from injection.     Procedures: Right knee steroid injection: After sterile prep with Betadine, injected 5 cc 1% lidocaine without epinephrine and 40 mg Depo-Medrol from superolateral approach.       PMFS History: Patient Active Problem List   Diagnosis Date Noted   Right knee pain 11/30/2020   BMI 36.0-36.9,adult 06/01/2020   Migraines 05/31/2020   Ventral hernia without obstruction or gangrene 11/14/2019   Hair loss 11/14/2019   Cervicogenic headache 05/17/2019   Ganglion cyst of wrist, left 05/29/2018   Hx of colonic polyp 09/20/2017   Anal skin tag 09/13/2017   Anxiety and depression 05/24/2017   Lateral epicondylitis 05/23/2016   Diabetes ([place]) 05/23/2016   Snoring  09/07/2015   Insomnia 06/02/2009   Morbid obesity ([place]) 05/19/2008   Hyperlipidemia 04/17/2008   Essential hypertension 04/17/2008   ALLERGIC RHINITIS 04/17/2008   Past Medical History:  Diagnosis Date   ALLERGIC RHINITIS    Anal skin tag 09/13/2017   COMMON MIGRAINE    GLUCOSE INTOLERANCE    Hx of colonic polyp 09/20/2017   Hypercalcemia    HYPERLIPIDEMIA    HYPERTENSION    INSOMNIA    OBESITY    RENAL INSUFFICIENCY     Family History  Problem Relation Age of Onset   Hypertension Mother    Dementia Mother    Other Father        MVA   Diabetes Maternal Grandfather    Asthma Brother    Rheum arthritis Maternal Aunt    Colon cancer Neg Hx    Stomach cancer Neg Hx    Rectal cancer Neg Hx     Past Surgical History:  Procedure Laterality Date   BREAST SURGERY  1998   Implants   hypertension post partum  09/2006   Not precalmpsia   Social History   Occupational History   Occupation: [employer]  Tobacco Use   Smoking status: Former    Packs/day: 0.25    Years: 8.00    Pack years: 2.00    Types:  Cigarettes    Quit date: 05/09/2005    Years since quitting: 15.6   Smokeless tobacco: Never   Tobacco comments:    Married, lives with spouse and son. 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  smoked socially  off and on until she became pregnant.  Vaping Use   Vaping Use: Never used  Substance and Sexual Activity   Alcohol use: Yes    Comment: social   Drug use: No   Sexual activity: Not on file

## 2020-12-17 ENCOUNTER — Other Ambulatory Visit: Payer: Self-pay | Admitting: Internal Medicine

## 2020-12-23 ENCOUNTER — Other Ambulatory Visit: Payer: Self-pay

## 2020-12-23 ENCOUNTER — Encounter: Payer: Self-pay | Admitting: Internal Medicine

## 2020-12-23 DIAGNOSIS — I10 Essential (primary) hypertension: Secondary | ICD-10-CM

## 2020-12-23 MED ORDER — RYBELSUS 7 MG PO TABS: 7.0000 mg | ORAL_TABLET | Freq: Every day | ORAL | 1 refills | Status: DC

## 2020-12-23 MED ORDER — AMLODIPINE BESYLATE 5 MG PO TABS: 5.0000 mg | ORAL_TABLET | Freq: Every day | ORAL | 1 refills | Status: DC

## 2020-12-23 MED ORDER — ATORVASTATIN CALCIUM 40 MG PO TABS: 40.0000 mg | ORAL_TABLET | Freq: Every day | ORAL | 3 refills | Status: DC

## 2020-12-23 MED ORDER — SERTRALINE HCL 50 MG PO TABS: ORAL_TABLET | ORAL | 1 refills | Status: DC

## 2020-12-23 MED ORDER — METFORMIN HCL 500 MG PO TABS: 500.0000 mg | ORAL_TABLET | Freq: Two times a day (BID) | ORAL | 0 refills | Status: DC

## 2020-12-23 MED ORDER — LABETALOL HCL 200 MG PO TABS: ORAL_TABLET | ORAL | 1 refills | Status: DC

## 2020-12-23 MED ORDER — TRIAMTERENE-HCTZ 37.5-25 MG PO CAPS: 1.0000 | ORAL_CAPSULE | Freq: Every day | ORAL | 3 refills | Status: DC

## 2020-12-23 MED ORDER — FENOFIBRATE 145 MG PO TABS: ORAL_TABLET | ORAL | 1 refills | Status: DC

## 2020-12-28 ENCOUNTER — Encounter: Payer: Self-pay | Admitting: *Deleted

## 2021-01-05 ENCOUNTER — Ambulatory Visit
Admission: RE | Admit: 2021-01-05 | Discharge: 2021-01-05 | Disposition: A | Discharge status: Home / Self Care | Payer: BC Managed Care – PPO | Source: Ambulatory Visit | Attending: Family Medicine | Admitting: Family Medicine

## 2021-01-05 ENCOUNTER — Other Ambulatory Visit: Payer: Self-pay

## 2021-01-05 DIAGNOSIS — M25461 Effusion, right knee: Secondary | ICD-10-CM | POA: Diagnosis not present

## 2021-01-05 DIAGNOSIS — M23221 Derangement of posterior horn of medial meniscus due to old tear or injury, right knee: Secondary | ICD-10-CM | POA: Diagnosis not present

## 2021-01-05 DIAGNOSIS — M25561 Pain in right knee: Secondary | ICD-10-CM

## 2021-01-16 ENCOUNTER — Other Ambulatory Visit: Payer: Self-pay | Admitting: Internal Medicine

## 2021-01-16 IMAGING — US US THYROID
1 series · 8 of 25 positions shown · non-contrast
Comparison: 06/11/2019
COMPARISON: 06/11/2019

Addendum:
CLINICAL DATA: Nodules, palpable left neck region

EXAM:
THYROID ULTRASOUND
TECHNIQUE: Ultrasound examination of the thyroid gland and adjacent soft
tissues was performed.

[Series 1: us thyroid · 0.07mm/px · 8 of 105 slices shown]
[im 5/105]
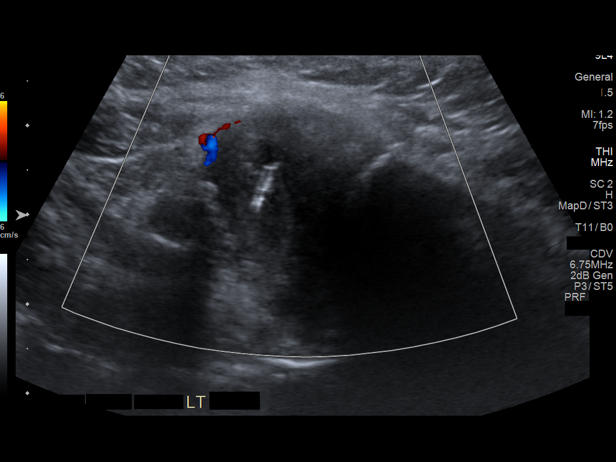
[im 18/105]
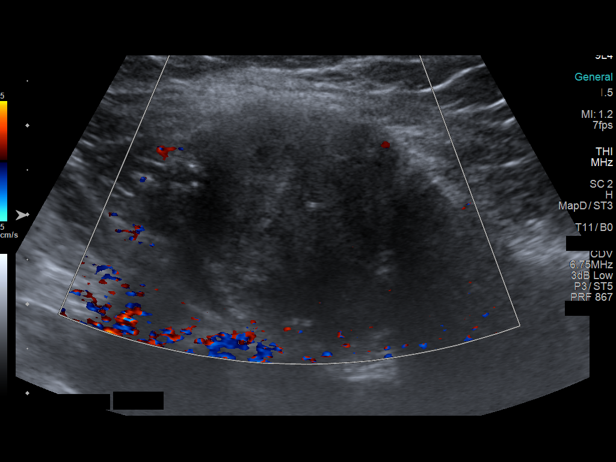
[im 31/105]
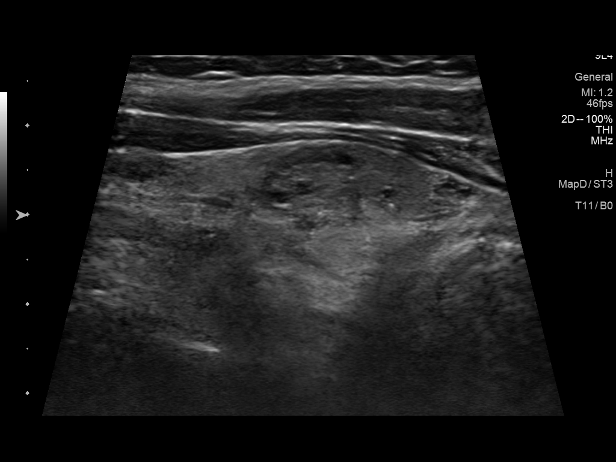
[im 44/105]
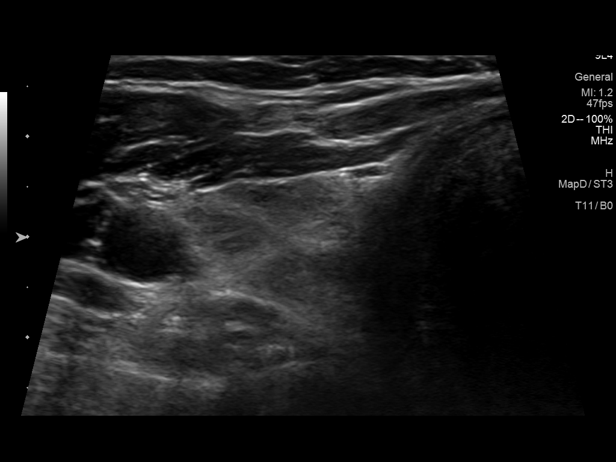
[im 61/105]
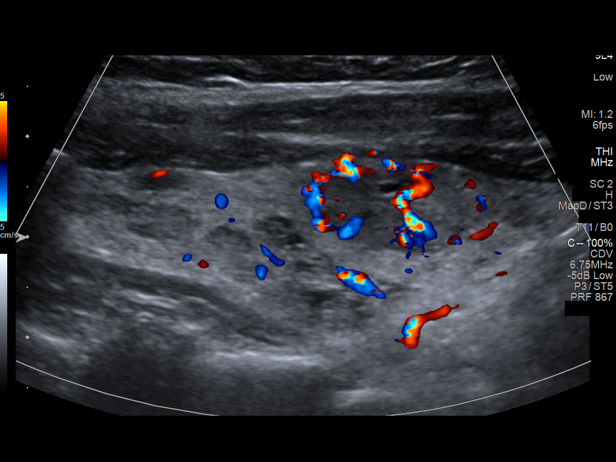
[im 74/105]
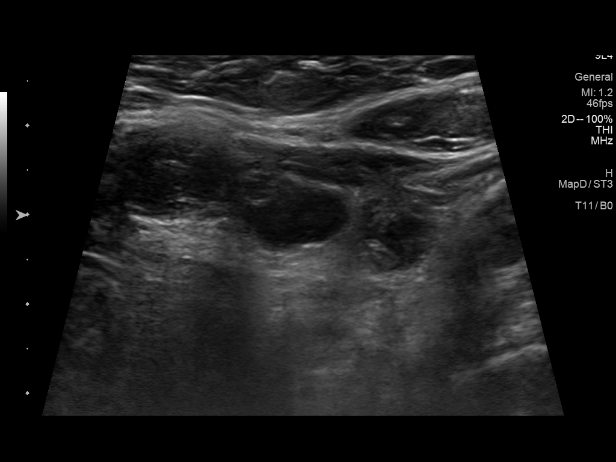
[im 87/105]
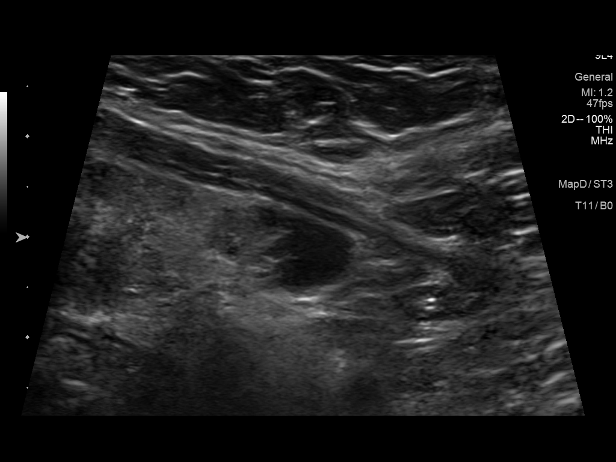
[im 100/105]
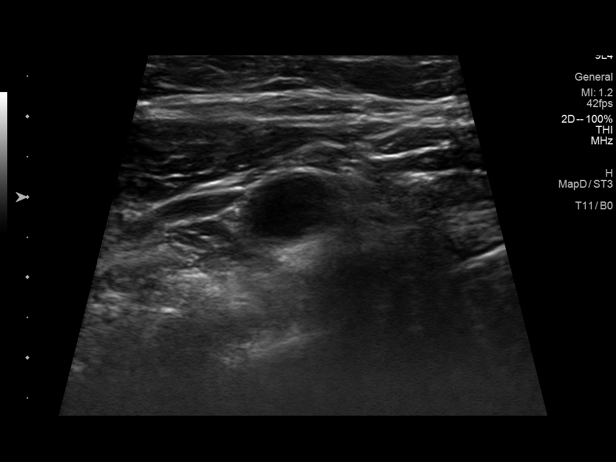

[8 of 25 positions shown; findings below may reference images not displayed]

FINDINGS: Parenchymal Echotexture: Markedly heterogenous

Isthmus: 0.4 cm thickness, previously

Right lobe: 5.9 x 2 x 2.3 cm, previously 5.6 x 1.9 x 2

Left lobe: 5.1 x 1.6 x 2.5 cm, previously 5.3 x 1.7 x

_________________________________________________________

Estimated total number of nodules >/= 1 cm: 6-10

Number of spongiform nodules >/=  2 cm not described below (TR1): 0

Number of mixed cystic and solid nodules >/= 1.5 cm not described
below (TR2): 0

_________________________________________________________

Nodule # 1:

Prior biopsy: No

Location: Isthmus;

Maximum size: 1.7 cm; Other 2 dimensions: 1.7 x 0.9 cm, previously,
1.7 x 1.3 x 1.1 cm

Composition: solid/almost completely solid (2)

Echogenicity: hypoechoic (2)

Shape: not taller-than-wide (0)

Margins: ill-defined (0)

Echogenic foci: none (0)

ACR TI-RADS total points: 4.

ACR TI-RADS risk category:  TR4 (4-6 points).

Significant change in size (>/= 20% in two dimensions and minimal
increase of 2 mm): No

Change in features: No

Change in ACR TI-RADS risk category: No

ACR TI-RADS recommendations:

**Given size (>/= 1.5 cm) and appearance, fine needle aspiration of
this moderately suspicious nodule should be considered based on
TI-RADS criteria.

Nodule # 2: 0.7 cm hypoechoic nodule without calcifications,
superior right, previously 0.6; This nodule does NOT meet TI-RADS
criteria for biopsy or dedicated follow-up.

Nodule # 3:

Prior biopsy: No

Location: Right; Mid

Maximum size: 1.2 cm; Other 2 dimensions: 1 x 0.8 cm, previously, 1
x 0.8 x 0.7 cm

Composition: solid/almost completely solid (2)

Echogenicity: hypoechoic (2)

Shape: not taller-than-wide (0)

Margins: smooth (0)

Echogenic foci: none (0)

ACR TI-RADS total points: 4.

ACR TI-RADS risk category:  TR4 (4-6 points).

Significant change in size (>/= 20% in two dimensions and minimal
increase of 2 mm): Yes

Change in features: No

Change in ACR TI-RADS risk category: No

ACR TI-RADS recommendations:

*Given size (>/= 1 - 1.4 cm) and appearance, a follow-up ultrasound
in 1 year should be considered based on TI-RADS criteria.

_________________________________________________________

Nodule # 4:

Prior biopsy: No

Location: Right; Mid lateral

Maximum size: 1.6 cm; Other 2 dimensions: 0.7 x 0.6 cm, previously,
1.1 x 0.7 x 0.7 cm

Composition: solid/almost completely solid (2)

Echogenicity: hypoechoic (2)

Shape: not taller-than-wide (0)

Margins: smooth (0)

Echogenic foci: none (0)

ACR TI-RADS total points: 4.

ACR TI-RADS risk category:  TR4 (4-6 points).

Significant change in size (>/= 20% in two dimensions and minimal
increase of 2 mm): No

Change in features: No

Change in ACR TI-RADS risk category: No

ACR TI-RADS recommendations:

**Given size (>/= 1.5 cm) and appearance, fine needle aspiration of
this moderately suspicious nodule should be considered based on
TI-RADS criteria.

_________________________________________________________

Nodule # 5:

Prior biopsy: No

Location: Right; Inferior

Maximum size: 2.1 cm; Other 2 dimensions: 1.6 x 1 cm, previously,
2.1 x 1.7 x 1.2 cm

Composition: solid/almost completely solid (2)

Echogenicity: hypoechoic (2)

Shape: not taller-than-wide (0)

Margins: ill-defined (0)

Echogenic foci: macrocalcifications (1)

ACR TI-RADS total points: 5.

ACR TI-RADS risk category:  TR4 (4-6 points).

Significant change in size (>/= 20% in two dimensions and minimal
increase of 2 mm): No

Change in features: No

Change in ACR TI-RADS risk category: No

ACR TI-RADS recommendations:

**Given size (>/= 1.5 cm) and appearance, fine needle aspiration of
this moderately suspicious nodule should be considered based on
TI-RADS criteria.

_________________________________________________________

Nodule # 6:

Prior biopsy: No

Location: Left; Superior

Maximum size: 1.4 cm; Other 2 dimensions: 0.4 x 1.3 cm, previously,
1.9 x 1.4 x 1.4 cm

Composition: solid/almost completely solid (2)

Echogenicity: hypoechoic (2)

Shape: not taller-than-wide (0)

Margins: ill-defined (0)

Echogenic foci: macrocalcifications (1)

ACR TI-RADS total points: 5.

ACR TI-RADS risk category:  TR4 (4-6 points).

Significant change in size (>/= 20% in two dimensions and minimal
increase of 2 mm): No

Change in features: No

Change in ACR TI-RADS risk category: No

ACR TI-RADS recommendations:

*Given size (>/= 1 - 1.4 cm) and appearance, a follow-up ultrasound
in 1 year should be considered based on TI-RADS criteria.

_________________________________________________________

Nodule # 7: 1.3 cm mid left nodule with macrocalcification;
recommend annual follow-up

Nodule # 8:

Prior biopsy: No

Location: Left; Inferior

Maximum size: 1.5 cm; Other 2 dimensions: 1.3 x 1 cm, previously,
1.2 x 0.8 x 1 cm

Composition: mixed cystic and solid (1)

Echogenicity: hypoechoic (2)

Shape: not taller-than-wide (0)

Margins: smooth (0)

Echogenic foci: macrocalcifications (1)

ACR TI-RADS total points: 4.

ACR TI-RADS risk category:  TR4 (4-6 points).

Significant change in size (>/= 20% in two dimensions and minimal
increase of 2 mm): No

Change in features: No

Change in ACR TI-RADS risk category: No

ACR TI-RADS recommendations:

**Given size (>/= 1.5 cm) and appearance, fine needle aspiration of
this moderately suspicious nodule should be considered based on
TI-RADS criteria.

_________________________________________________________

Nodule # 9:

Prior biopsy: No

Location: Left; Inferior

Maximum size: 1.5 cm; Other 2 dimensions: 1.4 x 0.8 cm, previously,
1.4 x 1.1 x 0.9 cm

Composition: mixed cystic and solid (1)

Echogenicity: hypoechoic (2)

Shape: not taller-than-wide (0)

Margins: smooth (0)

Echogenic foci: peripheral calcifications (2)

ACR TI-RADS total points: 5.

ACR TI-RADS risk category:  TR4 (4-6 points).

Significant change in size (>/= 20% in two dimensions and minimal
increase of 2 mm): No

Change in features: No

Change in ACR TI-RADS risk category: No

ACR TI-RADS recommendations:

**Given size (>/= 1.5 cm) and appearance, fine needle aspiration of
this moderately suspicious nodule should be considered based on
TI-RADS criteria.

_________________________________________________________

2.5 cm hypoechoic soft tissue mass near the sternoclavicular joint
in the region of concern.
IMPRESSION: 1. Thyromegaly with bilateral nodules. Multiple meet criteria for
FNA biopsy. Guidelines recommend FNA of the 2 largest/most worrisome
lesions namely 2.1 cm inferior right and 1.4 cm superior left.
2. Recommend annual/biennial ultrasound follow-up of additional
nodules as above, until stability x5 years confirmed.

The above is in keeping with the ACR TI-RADS recommendations - [HOSPITAL] 4619;[DATE].

ADDENDUM:
There is a typographical error in the original impression.

The 1.4 cm superior left nodule does NOT meet criteria for biopsy.

Multiple nodules meet criteria for biopsy including 1.7 cm isthmic,
1.6 cm right mid lateral, 2.1 cm right inferior, 1.5 cm left
inferior lateral, and 1.5 cm left inferior medial.

Since the diagnostic scan, FNA biopsy of isthmic nodule reported as
benign. FNA biopsy of inferior right nodule is still pending [facility]
results.

*** End of Addendum ***
FINDINGS: Parenchymal Echotexture: Markedly heterogenous

Isthmus: 0.4 cm thickness, previously

Right lobe: 5.9 x 2 x 2.3 cm, previously 5.6 x 1.9 x 2

Left lobe: 5.1 x 1.6 x 2.5 cm, previously 5.3 x 1.7 x

_________________________________________________________

Estimated total number of nodules >/= 1 cm: 6-10

Number of spongiform nodules >/=  2 cm not described below (TR1): 0

Number of mixed cystic and solid nodules >/= 1.5 cm not described
below (TR2): 0

_________________________________________________________

Nodule # 1:

Prior biopsy: No

Location: Isthmus;

Maximum size: 1.7 cm; Other 2 dimensions: 1.7 x 0.9 cm, previously,
1.7 x 1.3 x 1.1 cm

Composition: solid/almost completely solid (2)

Echogenicity: hypoechoic (2)

Shape: not taller-than-wide (0)

Margins: ill-defined (0)

Echogenic foci: none (0)

ACR TI-RADS total points: 4.

ACR TI-RADS risk category:  TR4 (4-6 points).

Significant change in size (>/= 20% in two dimensions and minimal
increase of 2 mm): No

Change in features: No

Change in ACR TI-RADS risk category: No

ACR TI-RADS recommendations:

**Given size (>/= 1.5 cm) and appearance, fine needle aspiration of
this moderately suspicious nodule should be considered based on
TI-RADS criteria.

Nodule # 2: 0.7 cm hypoechoic nodule without calcifications,
superior right, previously 0.6; This nodule does NOT meet TI-RADS
criteria for biopsy or dedicated follow-up.

Nodule # 3:

Prior biopsy: No

Location: Right; Mid

Maximum size: 1.2 cm; Other 2 dimensions: 1 x 0.8 cm, previously, 1
x 0.8 x 0.7 cm

Composition: solid/almost completely solid (2)

Echogenicity: hypoechoic (2)

Shape: not taller-than-wide (0)

Margins: smooth (0)

Echogenic foci: none (0)

ACR TI-RADS total points: 4.

ACR TI-RADS risk category:  TR4 (4-6 points).

Significant change in size (>/= 20% in two dimensions and minimal
increase of 2 mm): Yes

Change in features: No

Change in ACR TI-RADS risk category: No

ACR TI-RADS recommendations:

*Given size (>/= 1 - 1.4 cm) and appearance, a follow-up ultrasound
in 1 year should be considered based on TI-RADS criteria.

_________________________________________________________

Nodule # 4:

Prior biopsy: No

Location: Right; Mid lateral

Maximum size: 1.6 cm; Other 2 dimensions: 0.7 x 0.6 cm, previously,
1.1 x 0.7 x 0.7 cm

Composition: solid/almost completely solid (2)

Echogenicity: hypoechoic (2)

Shape: not taller-than-wide (0)

Margins: smooth (0)

Echogenic foci: none (0)

ACR TI-RADS total points: 4.

ACR TI-RADS risk category:  TR4 (4-6 points).

Significant change in size (>/= 20% in two dimensions and minimal
increase of 2 mm): No

Change in features: No

Change in ACR TI-RADS risk category: No

ACR TI-RADS recommendations:

**Given size (>/= 1.5 cm) and appearance, fine needle aspiration of
this moderately suspicious nodule should be considered based on
TI-RADS criteria.

_________________________________________________________

Nodule # 5:

Prior biopsy: No

Location: Right; Inferior

Maximum size: 2.1 cm; Other 2 dimensions: 1.6 x 1 cm, previously,
2.1 x 1.7 x 1.2 cm

Composition: solid/almost completely solid (2)

Echogenicity: hypoechoic (2)

Shape: not taller-than-wide (0)

Margins: ill-defined (0)

Echogenic foci: macrocalcifications (1)

ACR TI-RADS total points: 5.

ACR TI-RADS risk category:  TR4 (4-6 points).

Significant change in size (>/= 20% in two dimensions and minimal
increase of 2 mm): No

Change in features: No

Change in ACR TI-RADS risk category: No

ACR TI-RADS recommendations:

**Given size (>/= 1.5 cm) and appearance, fine needle aspiration of
this moderately suspicious nodule should be considered based on
TI-RADS criteria.

_________________________________________________________

Nodule # 6:

Prior biopsy: No

Location: Left; Superior

Maximum size: 1.4 cm; Other 2 dimensions: 0.4 x 1.3 cm, previously,
1.9 x 1.4 x 1.4 cm

Composition: solid/almost completely solid (2)

Echogenicity: hypoechoic (2)

Shape: not taller-than-wide (0)

Margins: ill-defined (0)

Echogenic foci: macrocalcifications (1)

ACR TI-RADS total points: 5.

ACR TI-RADS risk category:  TR4 (4-6 points).

Significant change in size (>/= 20% in two dimensions and minimal
increase of 2 mm): No

Change in features: No

Change in ACR TI-RADS risk category: No

ACR TI-RADS recommendations:

*Given size (>/= 1 - 1.4 cm) and appearance, a follow-up ultrasound
in 1 year should be considered based on TI-RADS criteria.

_________________________________________________________

Nodule # 7: 1.3 cm mid left nodule with macrocalcification;
recommend annual follow-up

Nodule # 8:

Prior biopsy: No

Location: Left; Inferior

Maximum size: 1.5 cm; Other 2 dimensions: 1.3 x 1 cm, previously,
1.2 x 0.8 x 1 cm

Composition: mixed cystic and solid (1)

Echogenicity: hypoechoic (2)

Shape: not taller-than-wide (0)

Margins: smooth (0)

Echogenic foci: macrocalcifications (1)

ACR TI-RADS total points: 4.

ACR TI-RADS risk category:  TR4 (4-6 points).

Significant change in size (>/= 20% in two dimensions and minimal
increase of 2 mm): No

Change in features: No

Change in ACR TI-RADS risk category: No

ACR TI-RADS recommendations:

**Given size (>/= 1.5 cm) and appearance, fine needle aspiration of
this moderately suspicious nodule should be considered based on
TI-RADS criteria.

_________________________________________________________

Nodule # 9:

Prior biopsy: No

Location: Left; Inferior

Maximum size: 1.5 cm; Other 2 dimensions: 1.4 x 0.8 cm, previously,
1.4 x 1.1 x 0.9 cm

Composition: mixed cystic and solid (1)

Echogenicity: hypoechoic (2)

Shape: not taller-than-wide (0)

Margins: smooth (0)

Echogenic foci: peripheral calcifications (2)

ACR TI-RADS total points: 5.

ACR TI-RADS risk category:  TR4 (4-6 points).

Significant change in size (>/= 20% in two dimensions and minimal
increase of 2 mm): No

Change in features: No

Change in ACR TI-RADS risk category: No

ACR TI-RADS recommendations:

**Given size (>/= 1.5 cm) and appearance, fine needle aspiration of
this moderately suspicious nodule should be considered based on
TI-RADS criteria.

_________________________________________________________

2.5 cm hypoechoic soft tissue mass near the sternoclavicular joint
in the region of concern.
IMPRESSION: 1. Thyromegaly with bilateral nodules. Multiple meet criteria for
FNA biopsy. Guidelines recommend FNA of the 2 largest/most worrisome
lesions namely 2.1 cm inferior right and 1.4 cm superior left.
2. Recommend annual/biennial ultrasound follow-up of additional
nodules as above, until stability x5 years confirmed.

The above is in keeping with the ACR TI-RADS recommendations - [HOSPITAL] 4619;[DATE].

## 2021-01-20 ENCOUNTER — Other Ambulatory Visit: Payer: Self-pay

## 2021-01-20 ENCOUNTER — Telehealth: Payer: Self-pay

## 2021-01-20 ENCOUNTER — Ambulatory Visit: Payer: BC Managed Care – PPO | Admitting: Orthopedic Surgery

## 2021-01-20 ENCOUNTER — Encounter: Payer: Self-pay | Admitting: Orthopedic Surgery

## 2021-01-20 DIAGNOSIS — M25561 Pain in right knee: Secondary | ICD-10-CM | POA: Diagnosis not present

## 2021-01-20 DIAGNOSIS — M1711 Unilateral primary osteoarthritis, right knee: Secondary | ICD-10-CM

## 2021-01-20 DIAGNOSIS — S83241A Other tear of medial meniscus, current injury, right knee, initial encounter: Secondary | ICD-10-CM | POA: Diagnosis not present

## 2021-01-20 NOTE — Progress Notes (Signed)
Office Visit Note   Patient: [name]           Date of Birth: 1965/12/06           MRN: [id number] Visit Date: 01/20/2021 Requested by: Pincus Sanes, MD [address] [place],  [place] [address] PCP: Pincus Sanes, MD  Subjective: Chief Complaint  Patient presents with   Right Knee - Pain    HPI: [name] is a 55 y.o. female who presents to the office for MRI review. Continues to complain mainly of right knee pain over the last year.  Onset was after she crouched down and she noticed sudden severe pain about a year ago.  She has had previous treatment with Dr. Prince Rome with injection in her right knee at her last office visit on 12/15/2020 that provided about 30 to 40% relief but is wearing off.  She localizes pain to the medial and posterior aspect of the right knee with swelling and stiffness that comes on based on her activity.  She ambulates with a limp and is very concerned about her knee pain.  Denies any groin pain, low back pain, numbness/tingling.  She has no history of surgery on her knee.  She does have prediabetes and is on metformin but does not use insulin.  No history of smoking, heart disease, blood thinner use.  Only other medical history is hypertension.  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  MRI results revealed: MR Knee Right w/o contrast  Result Date: 01/05/2021 CLINICAL DATA:  Knee pain, chronic, negative xray (Age >= 5y) EXAM: MRI OF THE RIGHT KNEE WITHOUT CONTRAST TECHNIQUE: Multiplanar, multisequence MR imaging of the knee was performed. No intravenous contrast was administered. COMPARISON:  Right knee radiograph 12/01/2020 FINDINGS: MENISCI Medial: There is a radial tear of the posterior horn of the medial meniscus near the meniscal root, with mild extrusion. Lateral: Intact. LIGAMENTS Cruciates: ACL and PCL are intact. Collaterals: Medial collateral ligament is intact. Lateral collateral ligament complex is  intact. CARTILAGE Patellofemoral:  Low-grade central trochlear chondral fissuring. Medial: Moderate partial-thickness cartilage loss along the weight-bearing surfaces. Lateral:  No chondral defect. JOINT: Small joint effusion. POPLITEAL FOSSA: No significant Baker's cyst. EXTENSOR MECHANISM: Intact quadriceps tendon. Intact patellar tendon. BONES: No aggressive osseous lesion. No fracture or dislocation. Marginal osteophyte formation of the medial compartment. Other: No fluid collection or hematoma. Muscles are normal. IMPRESSION: Radial tear of the posterior horn of the medial meniscus near the meniscal root with mild extrusion. Medial compartment osteoarthritis with moderate partial-thickness cartilage loss along the weight-bearing surfaces. Small joint effusion. Electronically Signed   By: Caprice Renshaw M.D.   On: 01/05/2021 16:47                 ROS: All systems reviewed are negative as they relate to the chief complaint within the history of present illness.  Patient denies fevers or chills.  Assessment & Plan: Visit Diagnoses:  1. Primary osteoarthritis of right knee   2. Right knee pain, unspecified chronicity   3. Other tear of medial meniscus of right knee as current injury, initial encounter     Plan: [name] is a 55 y.o. female who presents to the office MRI of the right knee reviewed with her today which does show meniscal extrusion with radial tear of the medial meniscus near the meniscal root posteriorly.  She does have some meniscal tissue present at the site of  the tear but definite loss of function of the meniscus given the extrusion.  With moderate osteoarthritic changes of the medial compartment already present, meniscal root repair is not a great option.  Discussed the options available to the patient which mainly include continuing with serial injections versus living with her symptoms versus surgery which would likely be partial versus total knee replacement.  The other  compartments appear spared from arthritis but she does have some lateral joint line tenderness on exam so any final decision on knee arthroplasty (partial versus total) would likely be decision made at time of surgery based on the condition of the other compartments.  After lengthy discussion, patient would like to try a gel injection in the right knee to see how this does.  If no improvement, she will consider surgery in the future.  Additionally, patient endorses multiple joint complaints with a family history of rheumatoid arthritis (her maternal aunt had RA).  She reports she has had noticed increasing pain in the MCPs of both hands since she was in her 39s.  She has never been worked up for rheumatoid arthritis and would like to be evaluated.  Ordered CBC with differential, ESR, CRP, ANA, rheumatoid factor, anti-CCP antibody for further evaluation.  If lab test suggest RA, she will be referred to rheumatologist.  This patient is diagnosed with osteoarthritis of the knee(s).    Radiographs show evidence of joint space narrowing, osteophytes, subchondral sclerosis and/or subchondral cysts.  This patient has knee pain which interferes with functional and activities of daily living.    This patient has experienced inadequate response, adverse effects and/or intolerance with conservative treatments such as acetaminophen, NSAIDS, topical creams, physical therapy or regular exercise, knee bracing and/or weight loss.   This patient has experienced inadequate response or has a contraindication to intra articular steroid injections for at least 3 months.   This patient is not scheduled to have a total knee replacement within 6 months of starting treatment with viscosupplementation.   Follow-Up Instructions: No follow-ups on file.   Orders:  Orders Placed This Encounter  Procedures   CBC with Differential   Sed Rate (ESR)   C-reactive protein   Antinuclear Antib (ANA)   Cyclic citrul peptide  antibody, IgG   No orders of the defined types were placed in this encounter.     Procedures: No procedures performed   Clinical Data: No additional findings.  Objective: Vital Signs: There were no vitals taken for this visit.  Physical Exam:  Constitutional: Patient appears well-developed HEENT:  Head: Normocephalic Eyes:EOM are normal Neck: Normal range of motion Cardiovascular: Normal rate Pulmonary/chest: Effort normal Neurologic: Patient is alert Skin: Skin is warm Psychiatric: Patient has normal mood and affect  Ortho Exam: Ortho exam demonstrates right knee with small effusion.  Moderate to severe tenderness over the medial joint line with mild tenderness over the lateral joint line.  No significant patellar crepitus noted.  No calf tenderness.  Negative Homans' sign.  She has range of motion from 3degrees to 110 degrees.  No pain with hip range of motion.  Able to perform straight leg raise, extensor mechanism intact.  Specialty Comments:  No specialty comments available.  Imaging: No results found.   PMFS History: Patient Active Problem List   Diagnosis Date Noted   Right knee pain 11/30/2020   BMI 36.0-36.9,adult 06/01/2020   Migraines 05/31/2020   Ventral hernia without obstruction or gangrene 11/14/2019   Hair loss 11/14/2019   Cervicogenic headache 05/17/2019  Ganglion cyst of wrist, left 05/29/2018   Hx of colonic polyp 09/20/2017   Anal skin tag 09/13/2017   Anxiety and depression 05/24/2017   Lateral epicondylitis 05/23/2016   Diabetes ([place]) 05/23/2016   Snoring 09/07/2015   Insomnia 06/02/2009   Morbid obesity ([place]) 05/19/2008   Hyperlipidemia 04/17/2008   Essential hypertension 04/17/2008   ALLERGIC RHINITIS 04/17/2008   Past Medical History:  Diagnosis Date   ALLERGIC RHINITIS    Anal skin tag 09/13/2017   COMMON MIGRAINE    GLUCOSE INTOLERANCE    Hx of colonic polyp 09/20/2017   Hypercalcemia    HYPERLIPIDEMIA    HYPERTENSION     INSOMNIA    OBESITY    RENAL INSUFFICIENCY     Family History  Problem Relation Age of Onset   Hypertension Mother    Dementia Mother    Other Father        MVA   Diabetes Maternal Grandfather    Asthma Brother    Rheum arthritis Maternal Aunt    Colon cancer Neg Hx    Stomach cancer Neg Hx    Rectal cancer Neg Hx     Past Surgical History:  Procedure Laterality Date   BREAST SURGERY  1998   Implants   hypertension post partum  09/2006   Not precalmpsia   Social History   Occupational History   Occupation: [employer]  Tobacco Use   Smoking status: Former    Packs/day: 0.25    Years: 8.00    Pack years: 2.00    Types: Cigarettes    Quit date: 05/09/2005    Years since quitting: 15.7   Smokeless tobacco: Never   Tobacco comments:    Married, lives with spouse and son. 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  smoked socially  off and on until she became pregnant.  Vaping Use   Vaping Use: Never used  Substance and Sexual Activity   Alcohol use: Yes    Comment: social   Drug use: No   Sexual activity: Not on file

## 2021-01-20 NOTE — Telephone Encounter (Signed)
Can we get auth for right knee gel injection

## 2021-01-22 ENCOUNTER — Other Ambulatory Visit: Payer: Self-pay | Admitting: Internal Medicine

## 2021-01-22 DIAGNOSIS — I10 Essential (primary) hypertension: Secondary | ICD-10-CM

## 2021-01-22 LAB — CBC WITH DIFFERENTIAL/PLATELET
Absolute Monocytes: 642 cells/uL (ref 200–950)
Basophils Absolute: 83 cells/uL (ref 0–200)
Basophils Relative: 1.2 %
Eosinophils Absolute: 200 cells/uL (ref 15–500)
Eosinophils Relative: 2.9 %
HCT: 42.6 % (ref 35.0–45.0)
Hemoglobin: 14 g/dL (ref 11.7–15.5)
Lymphs Abs: 2525 cells/uL (ref 850–3900)
MCH: 30 pg (ref 27.0–33.0)
MCHC: 32.9 g/dL (ref 32.0–36.0)
MCV: 91.4 fL (ref 80.0–100.0)
MPV: 10.1 fL (ref 7.5–12.5)
Monocytes Relative: 9.3 %
Neutro Abs: 3450 cells/uL (ref 1500–7800)
Neutrophils Relative %: 50 %
Platelets: 280 10*3/uL (ref 140–400)
RBC: 4.66 10*6/uL (ref 3.80–5.10)
RDW: 12.7 % (ref 11.0–15.0)
Total Lymphocyte: 36.6 %
WBC: 6.9 10*3/uL (ref 3.8–10.8)

## 2021-01-22 LAB — CYCLIC CITRUL PEPTIDE ANTIBODY, IGG: Cyclic Citrullin Peptide Ab: <16 UNITS

## 2021-01-22 LAB — SEDIMENTATION RATE: Sed Rate: 9 mm/h (ref 0–30)

## 2021-01-22 LAB — ANA: Anti Nuclear Antibody (ANA): NEGATIVE

## 2021-01-22 LAB — C-REACTIVE PROTEIN: CRP: 1.1 mg/L (ref <8.0)

## 2021-01-22 NOTE — Telephone Encounter (Signed)
Noted  

## 2021-01-23 IMAGING — DX DG CHEST 2V
2 series · 2 of 2 positions shown · non-contrast
Comparison: 12/18/2019

CLINICAL DATA: Follow-up pleural effusion

EXAM:
CHEST - 2 VIEW

[chest pa]
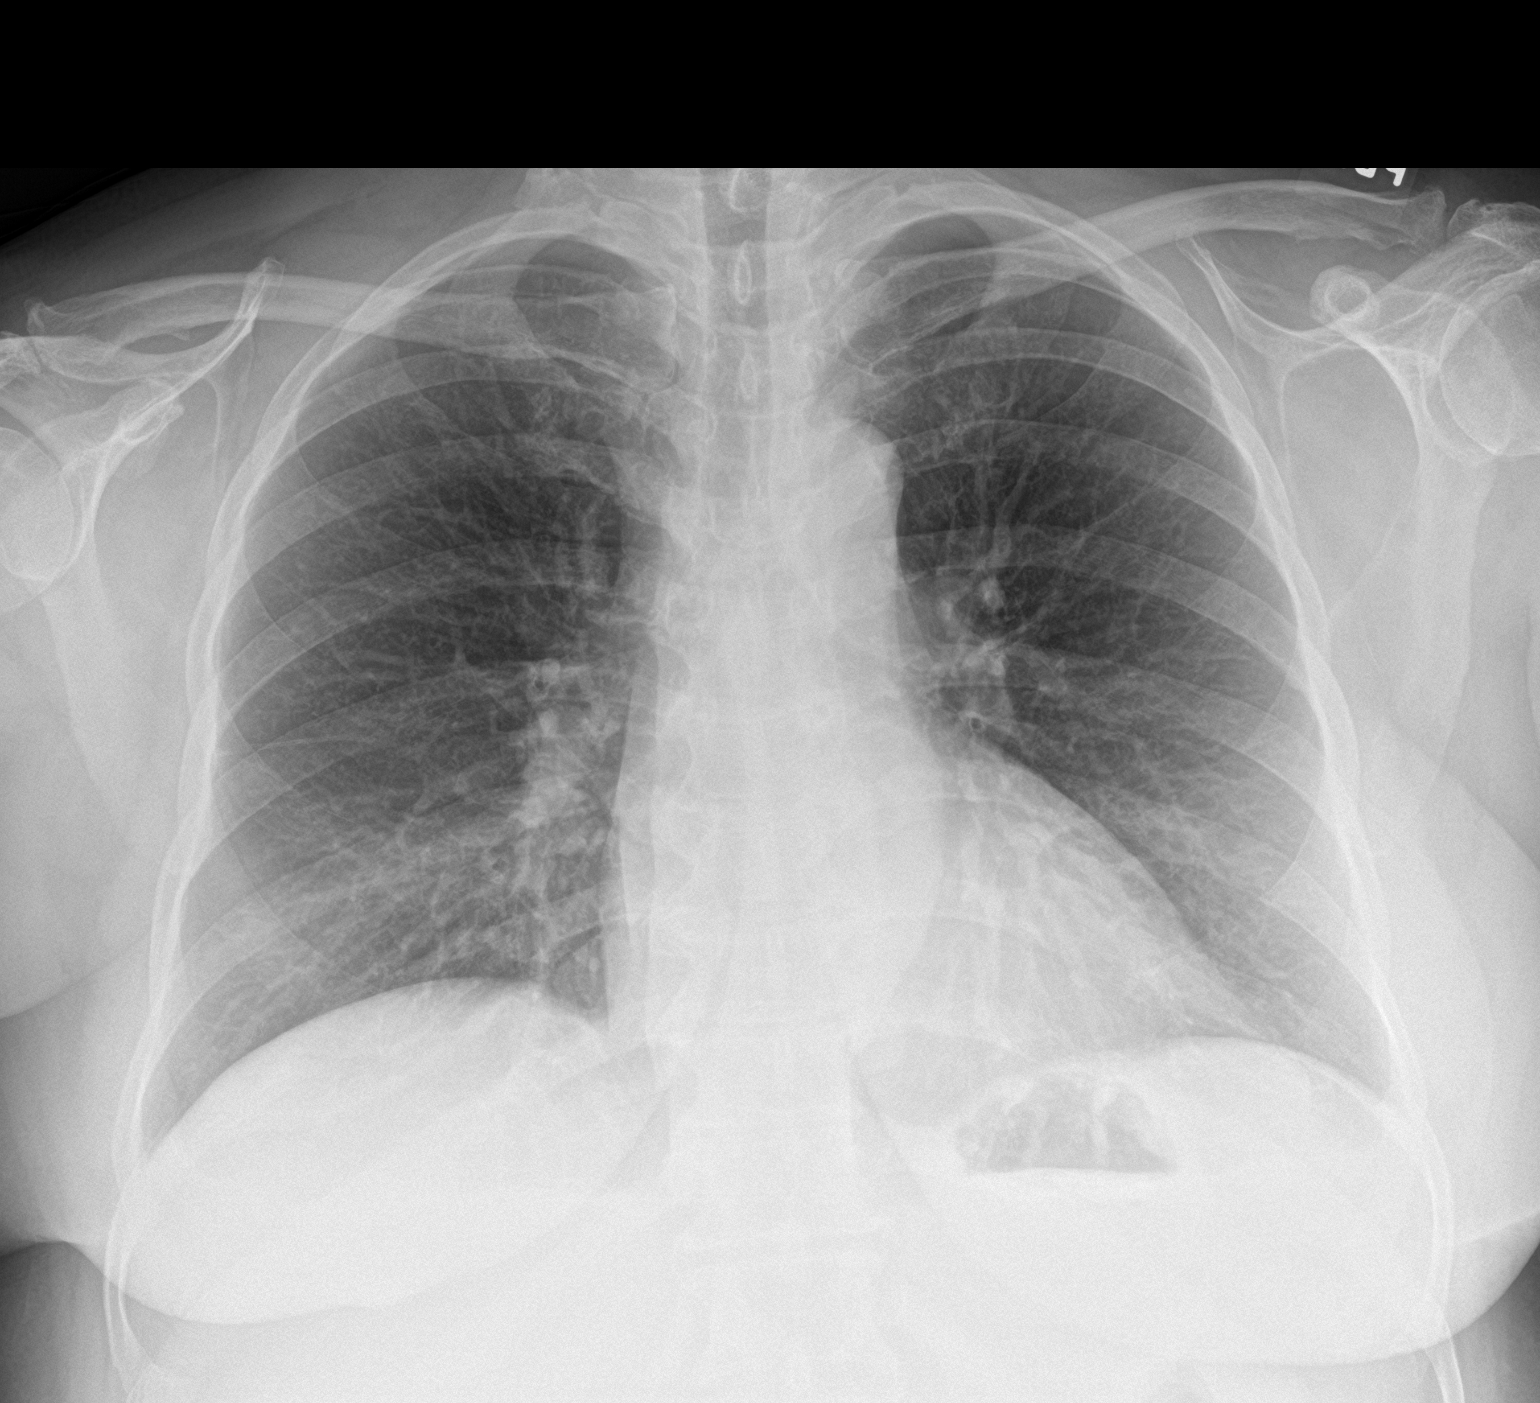

[chest lat]
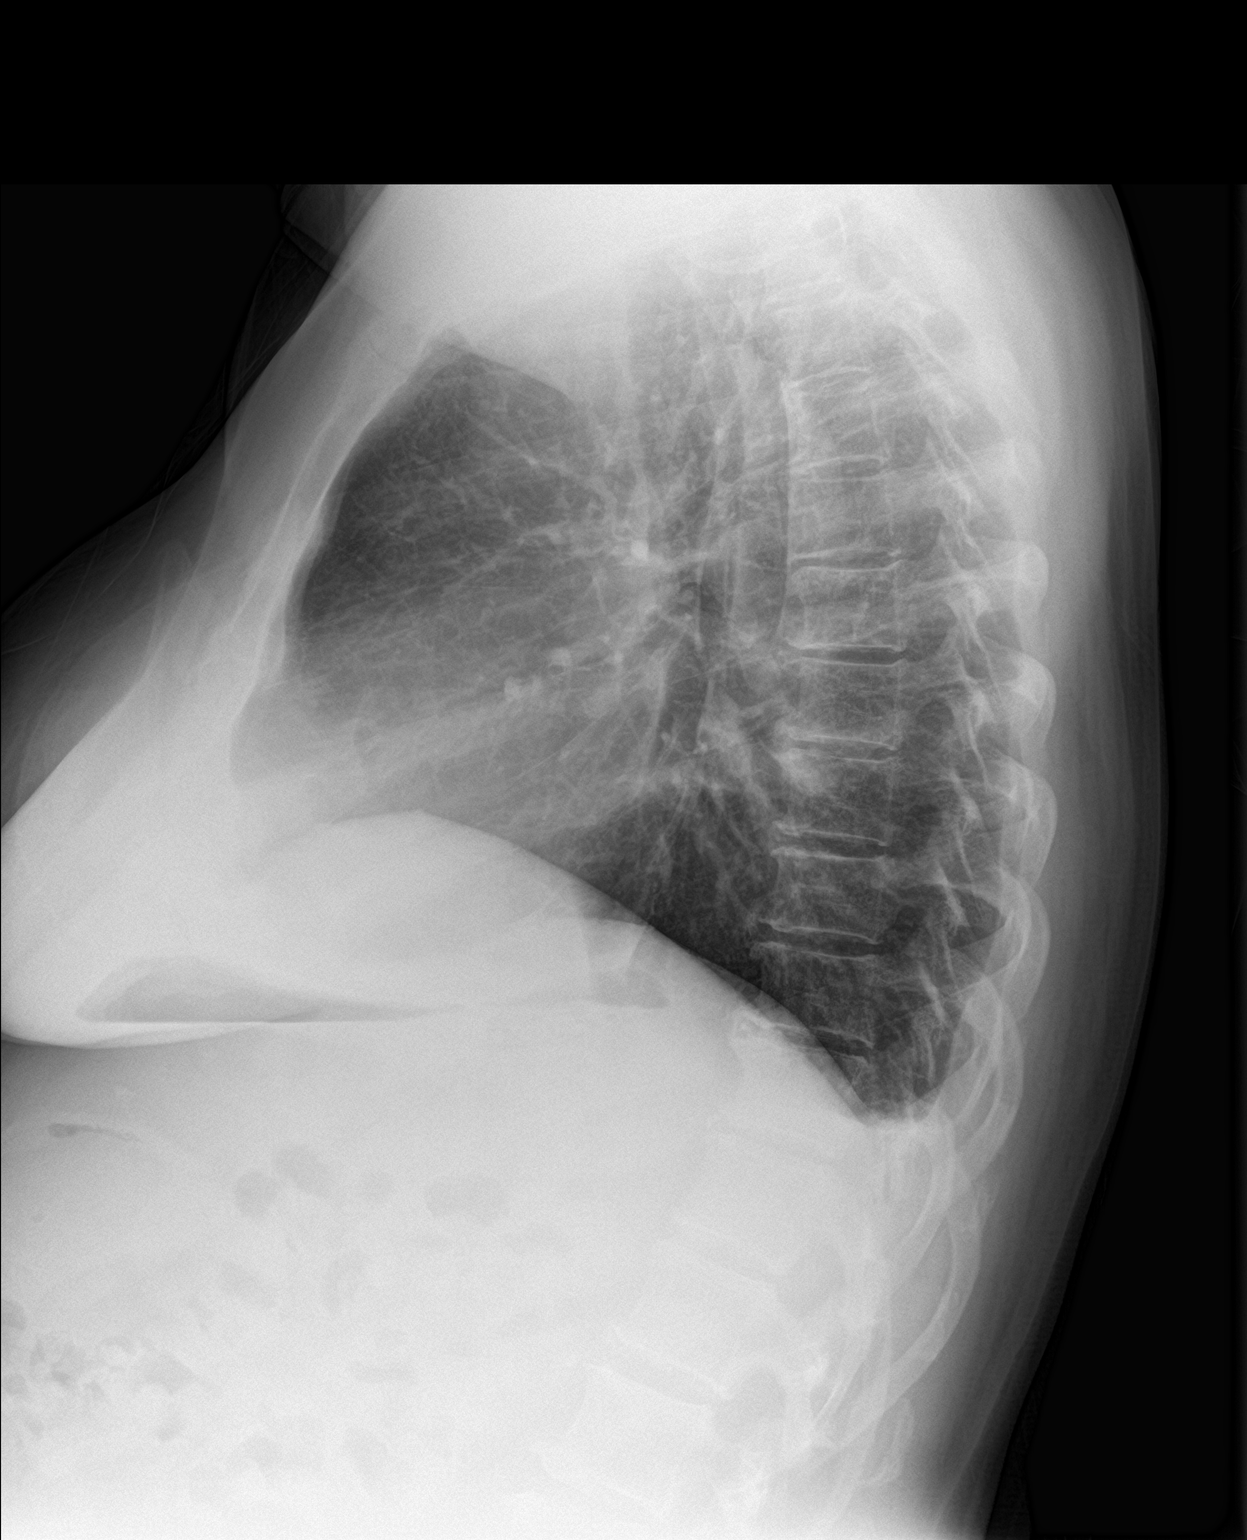

[2 of 2 positions shown; findings below may reference images not displayed]

FINDINGS: No consolidation or pneumothorax. Stable blunting of the
costophrenic sulcus on lateral view either reflecting pleural
effusion or pleural scarring. Normal heart size. No pneumothorax.
IMPRESSION: Stable blunting of the costophrenic sulcus on lateral view either
reflecting tiny pleural effusion or pleural scarring.

## 2021-01-23 IMAGING — US US FNA BIOPSY THYROID 1ST LESION
1 series · 13 of 25 positions shown · non-contrast
Comparison: US THYROID 12/26/2019

MEDICATIONS:
5 mL 1% lidocaine

COMPLICATIONS:
None immediate.

INDICATION: Indeterminate thyroid nodules of the isthmus and right inferior
thyroid. Request is made for fine-needle aspiration of indeterminate
nodules.

EXAM:
ULTRASOUND GUIDED FINE NEEDLE ASPIRATION OF INDETERMINATE THYROID
NODULE
TECHNIQUE: Informed written consent was obtained from the patient after a
discussion of the risks, benefits and alternatives to treatment.
Questions regarding the procedure were encouraged and answered. A
timeout was performed prior to the initiation of the procedure.

[Series 1: us fna biopsy thyroid 1st lesion · 0.06mm/px · 28 acquisitions, 13 frames shown]
[im 1/28]
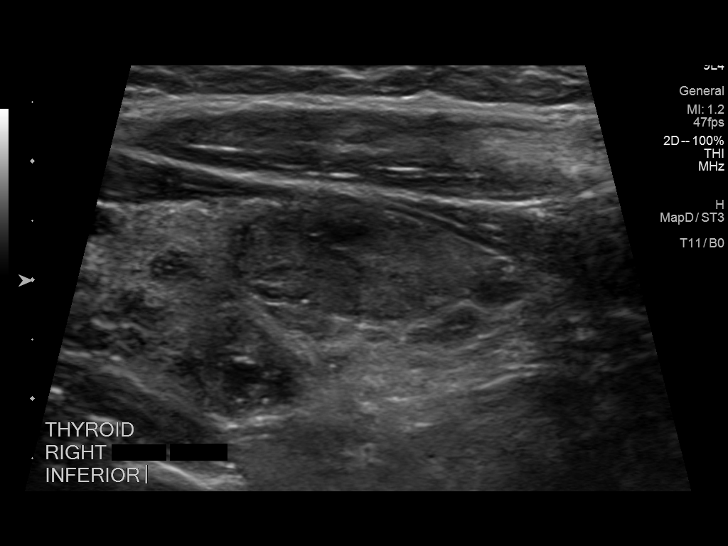
[im 3/28]
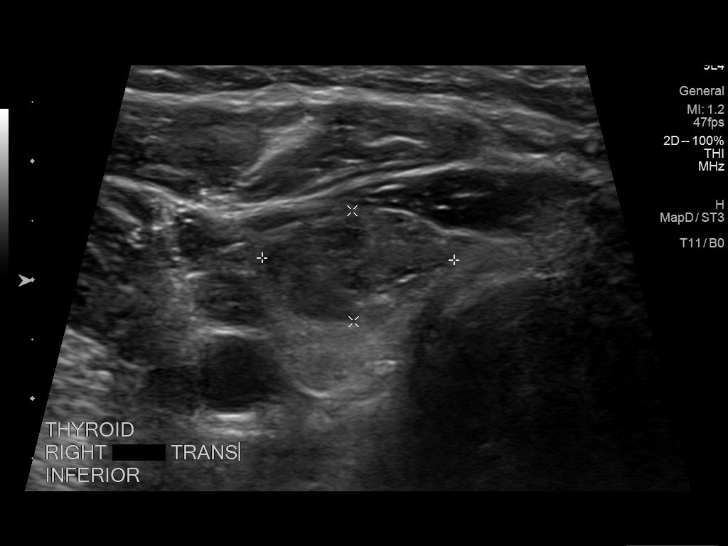
[im 5/28]
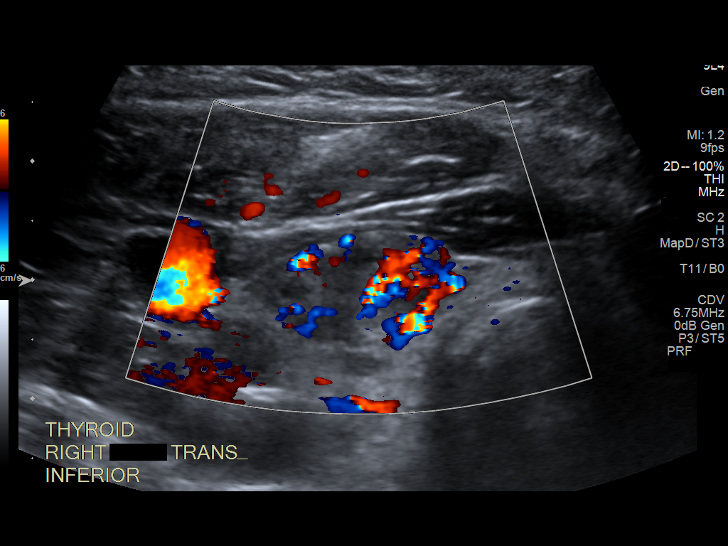
[im 7/28]
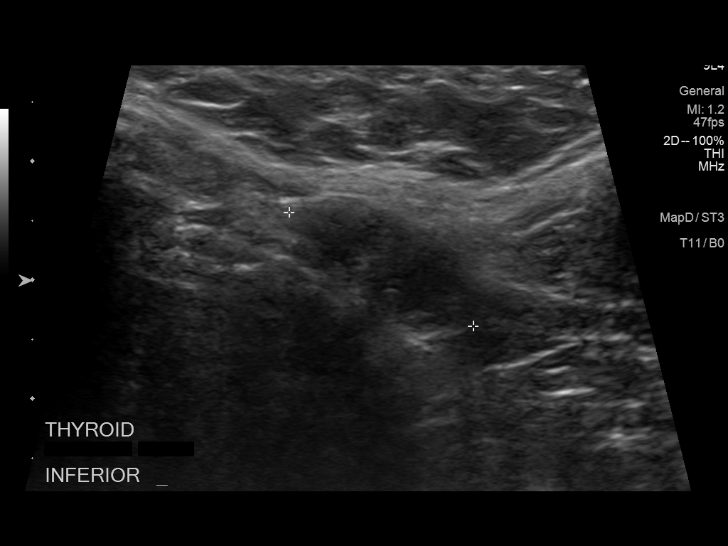
[im 10/28]
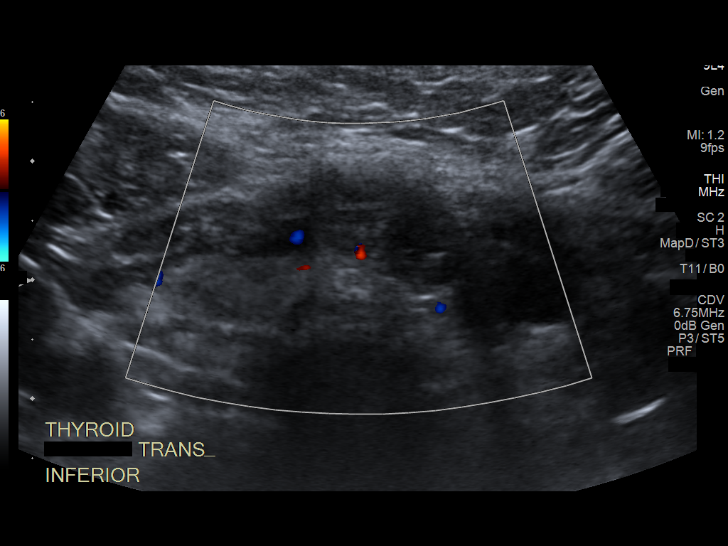
[im 12/28]
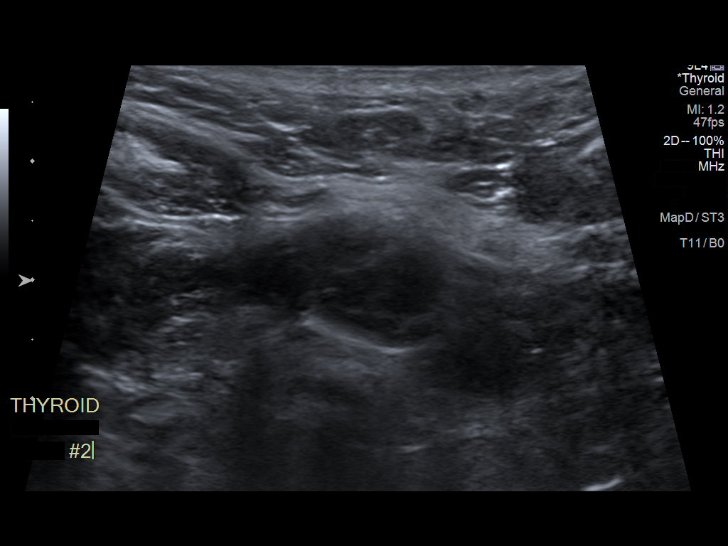
[im 14/28]
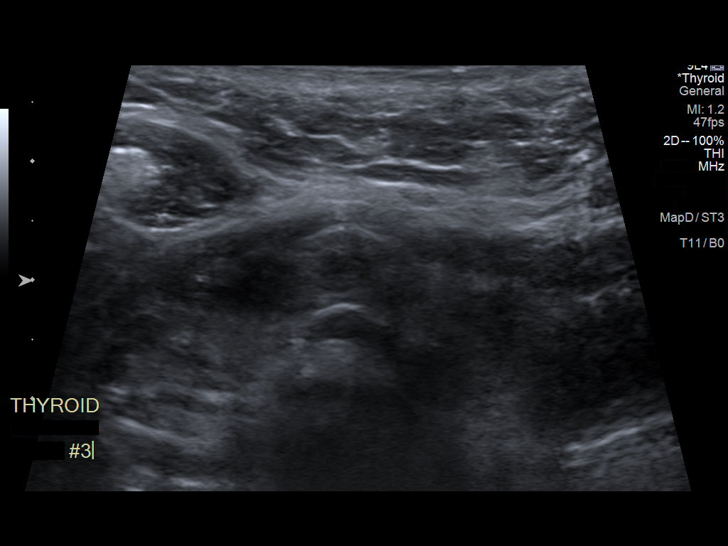
[im 16/28]
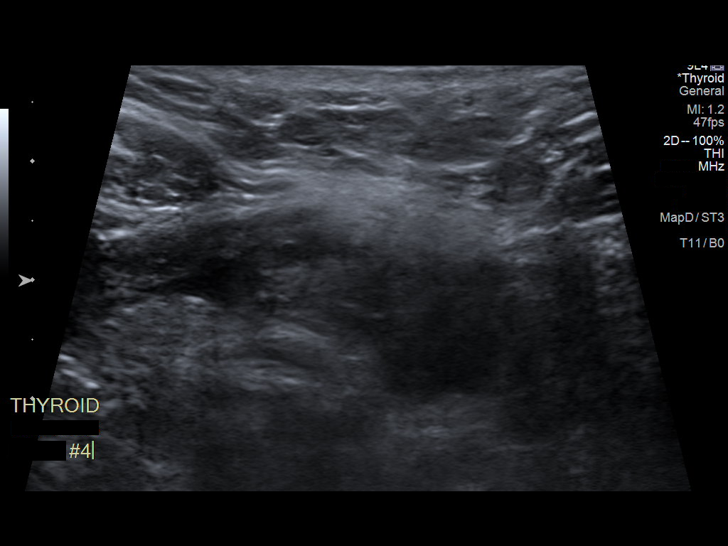
[im 19/28]
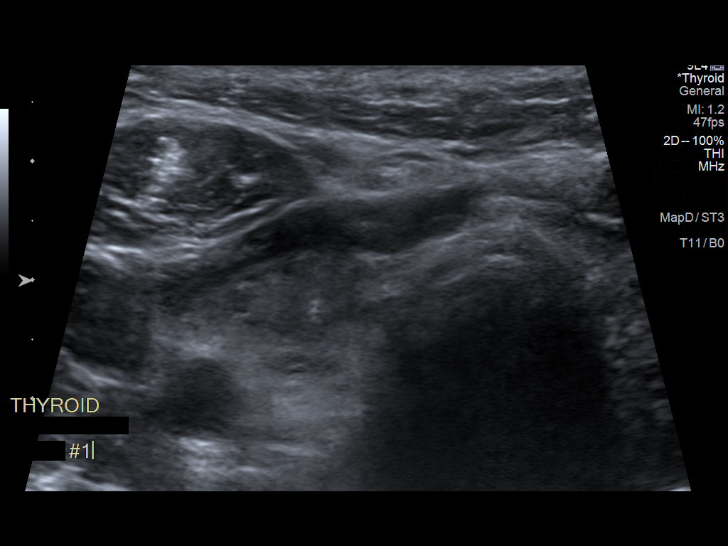
[im 21/28]
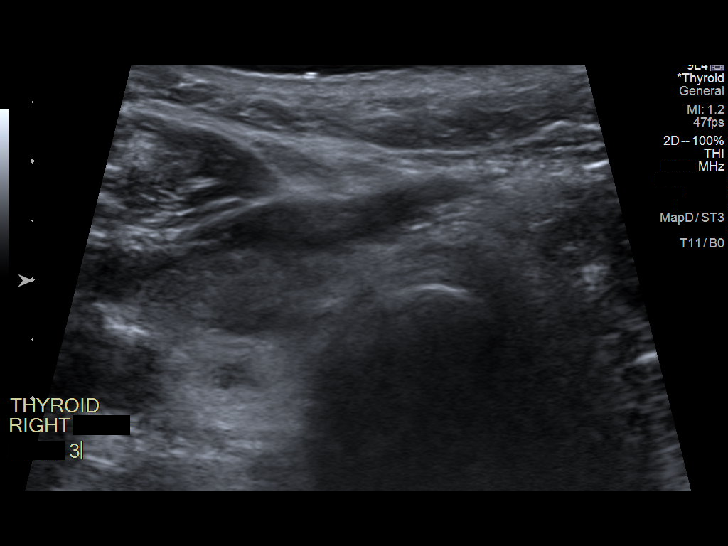
[im 23/28]
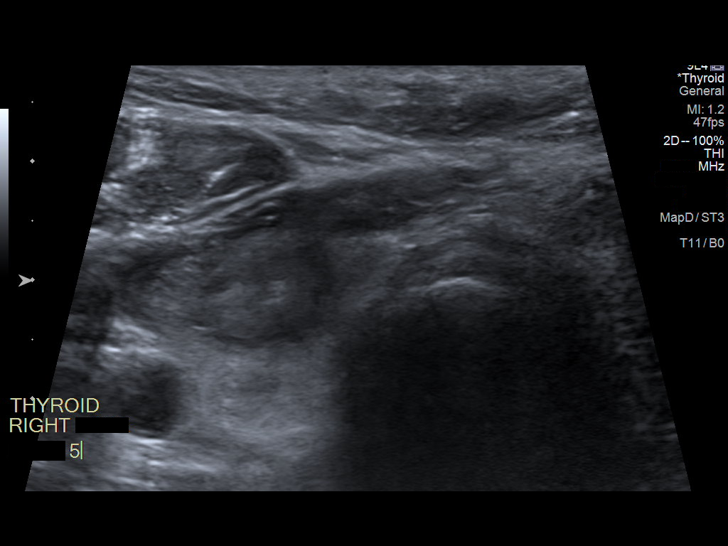
[im 25/28]
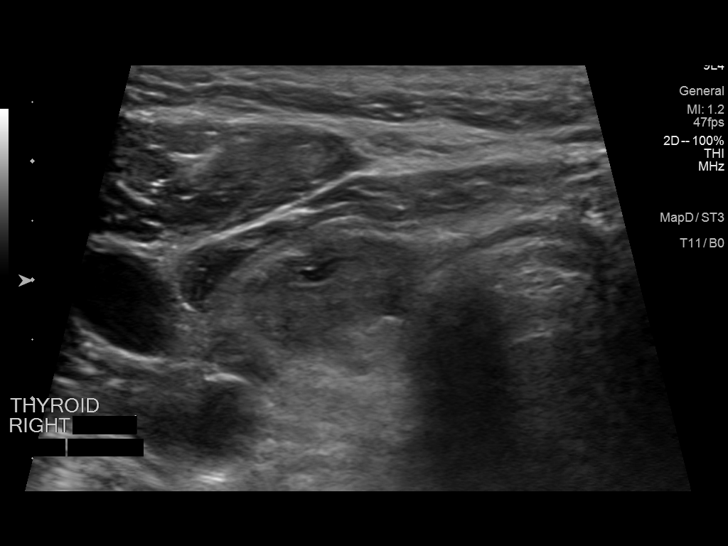
[im 28/28]
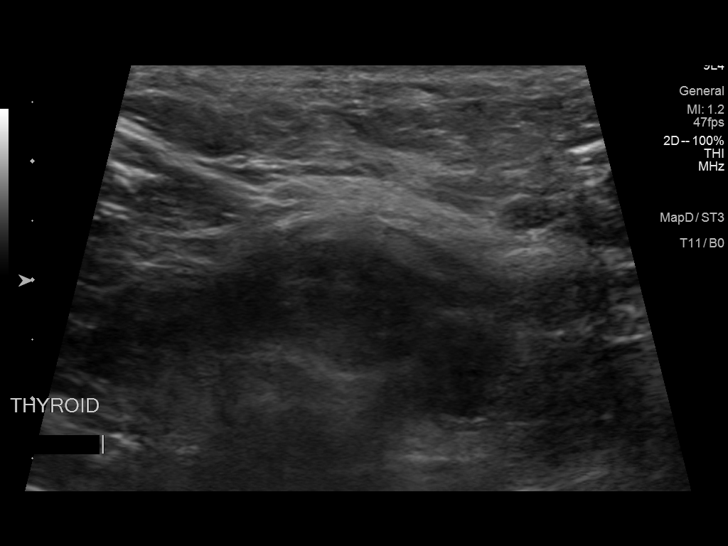

[13 of 25 positions shown; findings below may reference images not displayed]

Pre-procedural ultrasound scanning demonstrated unchanged size and
appearance of the indeterminate nodules within the isthmus and right
inferior thyroid.

The procedure was planned. The neck was prepped in the usual sterile
fashion, and a sterile drape was applied covering the operative
field. A timeout was performed prior to the initiation of the
procedure. Local anesthesia was provided with 1% lidocaine.

Under direct ultrasound guidance, 5 FNA biopsies were performed of
the isthmus nodule with a 25 gauge needle. Multiple ultrasound
images were saved for procedural documentation purposes. The samples
were prepared and submitted to pathology. Sample was also prepared
for Afirma testing.

Under direct ultrasound guidance, 5 FNA biopsies were performed of
the right inferior nodule with a 25 gauge needle. Multiple
ultrasound images were saved for procedural documentation purposes.
The samples were prepared and submitted to pathology. Sample was
also prepared for Afirma testing.

Limited post procedural scanning was positive for small hematoma at
the right inferior thyroid. Stable with no additional complication.
Dressings were placed. The patient tolerated the above procedures
procedure well without immediate postprocedural complication.
FINDINGS: Nodule reference number based on prior diagnostic ultrasound: 1

Maximum size: 1.7 cm

Location: Isthmus;

ACR TI-RADS risk category: TR4 (4-6 points)

Reason for biopsy: meets ACR TI-RADS criteria

_________________________________________________________

Nodule reference number based on prior diagnostic ultrasound: 5

Maximum size: 2.1 cm

Location: Right; Inferior

ACR TI-RADS risk category: TR4 (4-6 points)

Reason for biopsy: meets ACR TI-RADS criteria

Ultrasound imaging confirms appropriate placement of the needles
within the thyroid nodule.
IMPRESSION: 1. Technically successful ultrasound guided fine needle aspiration
of indeterminate isthmus nodule.
2. Technically successful ultrasound guided fine needle aspiration
of indeterminate right inferior nodule.

## 2021-01-27 NOTE — Progress Notes (Signed)
Did you call her with results

## 2021-01-31 NOTE — Progress Notes (Signed)
Yes I called and left VM

## 2021-02-01 NOTE — Progress Notes (Signed)
tyvm

## 2021-02-12 ENCOUNTER — Telehealth: Payer: Self-pay

## 2021-02-12 NOTE — Telephone Encounter (Signed)
VOB has been submitted, SynviscOne, right knee. Pending BV.

## 2021-02-16 ENCOUNTER — Telehealth: Payer: Self-pay

## 2021-02-16 NOTE — Telephone Encounter (Signed)
PA required for SynviscOne, right knee. Submitted PA online through Covermymeds Pending PA# [id number]

## 2021-02-19 ENCOUNTER — Telehealth: Payer: Self-pay

## 2021-02-19 NOTE — Telephone Encounter (Signed)
Called and left a VM for patient to CB to schedule for gel injection with Dr. Marlou Sa.  Approved for SynviscOne, right knee. Buy & Bill Covered at 100% after Co-pay Co-pay of $40.00-$80.00 PA Approval# [id number] Valid 02/16/2021- 08/14/2021

## 2021-03-08 ENCOUNTER — Encounter: Payer: Self-pay | Admitting: Internal Medicine

## 2021-03-08 DIAGNOSIS — E119 Type 2 diabetes mellitus without complications: Secondary | ICD-10-CM | POA: Diagnosis not present

## 2021-03-08 LAB — HM DIABETES EYE EXAM

## 2021-03-10 ENCOUNTER — Other Ambulatory Visit: Payer: Self-pay | Admitting: Surgery

## 2021-03-10 DIAGNOSIS — E041 Nontoxic single thyroid nodule: Secondary | ICD-10-CM

## 2021-03-29 ENCOUNTER — Ambulatory Visit
Admission: RE | Admit: 2021-03-29 | Discharge: 2021-03-29 | Disposition: A | Discharge status: Home / Self Care | Payer: BC Managed Care – PPO | Source: Ambulatory Visit | Attending: Surgery | Admitting: Surgery

## 2021-03-29 ENCOUNTER — Other Ambulatory Visit: Payer: BC Managed Care – PPO

## 2021-03-29 DIAGNOSIS — E041 Nontoxic single thyroid nodule: Secondary | ICD-10-CM

## 2021-04-02 NOTE — Progress Notes (Signed)
USN shows a change in the nodule on the left side, 1.4 cm, labelled as #8 on study.  This will need FNA biospy.  Claiborne Billings - please arrange biopsy with radiology.  Will contact patient with results when available.  Correll, MD [facility] [place] practice Office: [phone]

## 2021-04-07 DIAGNOSIS — H16223 Keratoconjunctivitis sicca, not specified as Sjogren's, bilateral: Secondary | ICD-10-CM | POA: Diagnosis not present

## 2021-04-15 ENCOUNTER — Other Ambulatory Visit: Payer: Self-pay | Admitting: Surgery

## 2021-04-15 DIAGNOSIS — E041 Nontoxic single thyroid nodule: Secondary | ICD-10-CM

## 2021-04-20 ENCOUNTER — Encounter: Payer: Self-pay | Admitting: Internal Medicine

## 2021-04-20 ENCOUNTER — Other Ambulatory Visit: Payer: Self-pay | Admitting: Internal Medicine

## 2021-04-20 MED ORDER — AZELASTINE HCL 0.1 % NA SOLN: 1.0000 | Freq: Two times a day (BID) | NASAL | 5 refills | Status: DC

## 2021-04-27 ENCOUNTER — Ambulatory Visit
Admission: RE | Admit: 2021-04-27 | Discharge: 2021-04-27 | Disposition: A | Discharge status: Home / Self Care | Payer: BC Managed Care – PPO | Source: Ambulatory Visit | Attending: Surgery | Admitting: Surgery

## 2021-04-27 ENCOUNTER — Other Ambulatory Visit (HOSPITAL_COMMUNITY)
Admission: RE | Admit: 2021-04-27 | Discharge: 2021-04-27 | Disposition: A | Discharge status: Home / Self Care | Payer: BC Managed Care – PPO | Source: Ambulatory Visit | Attending: Interventional Radiology | Admitting: Interventional Radiology

## 2021-04-27 DIAGNOSIS — E041 Nontoxic single thyroid nodule: Secondary | ICD-10-CM | POA: Insufficient documentation

## 2021-04-28 LAB — CYTOLOGY - NON PAP

## 2021-04-29 DIAGNOSIS — E041 Nontoxic single thyroid nodule: Secondary | ICD-10-CM | POA: Diagnosis not present

## 2021-05-05 NOTE — Progress Notes (Signed)
Thyroid biopsy shows atypia, therefore you cannot tell if it's benign or malignant.  It has been sent for molecular genetic testing which takes about 10-14 days.  We will contact the patient with the results when they are available.  [place], MD [facility] [place] practice Office: [phone]

## 2021-05-18 ENCOUNTER — Encounter (HOSPITAL_COMMUNITY): Payer: Self-pay

## 2021-05-19 NOTE — Progress Notes (Signed)
Molecular genetic test [facility]) result is BENIGN - less than 4% chance of cancer.  Patient needs to schedule OV with me for physical exam.  Also needs recent TSH level.  tmg  Armandina Gemma, [facility] [place] practice Office: [phone]

## 2021-05-20 DIAGNOSIS — H18413 Arcus senilis, bilateral: Secondary | ICD-10-CM | POA: Diagnosis not present

## 2021-05-20 DIAGNOSIS — H2513 Age-related nuclear cataract, bilateral: Secondary | ICD-10-CM | POA: Diagnosis not present

## 2021-05-20 DIAGNOSIS — H25043 Posterior subcapsular polar age-related cataract, bilateral: Secondary | ICD-10-CM | POA: Diagnosis not present

## 2021-05-20 DIAGNOSIS — H25013 Cortical age-related cataract, bilateral: Secondary | ICD-10-CM | POA: Diagnosis not present

## 2021-05-20 DIAGNOSIS — H2511 Age-related nuclear cataract, right eye: Secondary | ICD-10-CM | POA: Diagnosis not present

## 2021-06-02 DIAGNOSIS — H2511 Age-related nuclear cataract, right eye: Secondary | ICD-10-CM | POA: Diagnosis not present

## 2021-06-03 ENCOUNTER — Encounter: Payer: BC Managed Care – PPO | Admitting: Internal Medicine

## 2021-06-03 DIAGNOSIS — H25042 Posterior subcapsular polar age-related cataract, left eye: Secondary | ICD-10-CM | POA: Diagnosis not present

## 2021-06-03 DIAGNOSIS — H2512 Age-related nuclear cataract, left eye: Secondary | ICD-10-CM | POA: Diagnosis not present

## 2021-06-03 DIAGNOSIS — H25012 Cortical age-related cataract, left eye: Secondary | ICD-10-CM | POA: Diagnosis not present

## 2021-06-07 NOTE — Progress Notes (Signed)
Subjective:    Patient ID: [name], female    DOB: [birth year], 56 y.o.   MRN: [id number]   This visit occurred during the SARS-CoV-2 public health emergency.  Safety protocols were in place, including screening questions prior to the visit, additional usage of staff PPE, and extensive cleaning of exam room while observing appropriate contact time as indicated for disinfecting solutions.    HPI She is here for a physical exam.   Had right eye cataract removed - to have left done soon.  Has had more palpitations.  They have gotten a little bit less recently.  She is not sure why-does not seem to be related to anxiety, caffeine or medication.  They typically only last a few seconds.  No concerning symptoms with them.  Medications and allergies reviewed with patient and updated if appropriate.  Patient Active Problem List   Diagnosis Date Noted   Right knee pain 11/30/2020   BMI 36.0-36.9,adult 06/01/2020   Migraines 05/31/2020   Ventral hernia without obstruction or gangrene 11/14/2019   Hair loss 11/14/2019   Cervicogenic headache 05/17/2019   Ganglion cyst of wrist, left 05/29/2018   Hx of colonic polyp 09/20/2017   Anal skin tag 09/13/2017   Anxiety and depression 05/24/2017   Lateral epicondylitis 05/23/2016   Diabetes ([place]) 05/23/2016   Snoring 09/07/2015   Insomnia 06/02/2009   Morbid obesity ([place]) 05/19/2008   Hyperlipidemia 04/17/2008   Essential hypertension 04/17/2008   ALLERGIC RHINITIS 04/17/2008    Current Outpatient Medications on File Prior to Visit  Medication Sig Dispense Refill   amLODipine (NORVASC) 5 MG tablet Take 1 tablet (5 mg total) by mouth daily. 90 tablet 1   atorvastatin (LIPITOR) 40 MG tablet Take 1 tablet (40 mg total) by mouth daily. 90 tablet 3   azelastine (ASTELIN) 0.1 % nasal spray Place 1-2 sprays into both nostrils 2 (two) times daily. Use in each nostril as directed 30 mL 5   BESIVANCE 0.6 % SUSP Place 1 drop into the left  eye 3 (three) times daily.     ciclopirox (LOPROX) 0.77 % cream APPLY TO AFFECTED AREA TWICE A DAY     clotrimazole-betamethasone (LOTRISONE) cream APPLY TO AFFECTED AREA TWICE A DAY 30 g 0   Diclofenac Sodium (PENNSAID) 2 % SOLN [place]aid 20 mg/gram/actuation (2 %) topical soln in metered-dose pump  APPLY ONE PUMP TOPICALLY TO THE AFFECTED AREA TWICE DAILY AS NEEDED 112 g 3   doxycycline (MONODOX) 50 MG capsule Take 50 mg by mouth 2 (two) times daily.     fenofibrate (TRICOR) 145 MG tablet TAKE 1 TABLET BY MOUTH DAILY. GENERIC EQUIVALENT FOR TRICOR 90 tablet 1   Fremanezumab-vfrm (AJOVY) 225 MG/1.5ML SOAJ Inject 225 mg into the skin every 30 (thirty) days. 1.5 mL 11   glucose blood (ONETOUCH VERIO) test strip Use as instructed to check sugar daily 100 strip 3   glucose blood test strip OneTouch Verio test strips  TEST 4X DAILY     ketorolac (ACULAR) 0.5 % ophthalmic solution Place 1 drop into the left eye 4 (four) times daily.     labetalol (NORMODYNE) 200 MG tablet TAKE TWO TABLETS THREE TIMES DAILY. GENERIC EQUIVALENT FOR NORMODYNE 540 tablet 1   metFORMIN (GLUCOPHAGE) 500 MG tablet TAKE 1 TABLET BY MOUTH TWICE DAILY WITH A MEAL 180 tablet 0   nabumetone (RELAFEN) 500 MG tablet Take 1 tablet (500 mg total) by mouth 2 (two) times daily as needed. 60 tablet 3  Olopatadine HCl (PAZEO) 0.7 % SOLN Pazeo 0.7 % eye drops  INSTILL 1 DROP INTO BOTH EYES ONCE A DAY AS NEEDED     OneTouch Delica Lancets 81W MISC Apply topically 4 (four) times daily.     prednisoLONE acetate (PRED FORTE) 1 % ophthalmic suspension Place 1 drop into the left eye 4 (four) times daily.     RESTASIS 0.05 % ophthalmic emulsion INSTILL 1 DROP INTO BOTH EYES TWICE A DAY INSTILL 1 DROP INTO BOTH EYES TWICE DAILY     Rimegepant Sulfate (NURTEC) 75 MG TBDP Take 75 mg by mouth daily as needed (MIGRAINE). Max of 1 tablet per day. 10 tablet 10   Semaglutide (RYBELSUS) 7 MG TABS Take 7 mg by mouth daily. 90 tablet 1   sertraline  (ZOLOFT) 50 MG tablet TAKE 1 TABLET BY MOUTH EVERYDAY AT BEDTIME 90 tablet 1   triamcinolone cream (KENALOG) 0.1 % SMARTSIG:1 Application Topical 2-3 Times Daily     triamterene-hydrochlorothiazide (DYAZIDE) 37.5-25 MG capsule TAKE ONE CAPSULE BY MOUTH DAILY 90 capsule 3   No current facility-administered medications on file prior to visit.    Past Medical History:  Diagnosis Date   ALLERGIC RHINITIS    Anal skin tag 09/13/2017   COMMON MIGRAINE    GLUCOSE INTOLERANCE    Hx of colonic polyp 09/20/2017   Hypercalcemia    HYPERLIPIDEMIA    HYPERTENSION    INSOMNIA    OBESITY    RENAL INSUFFICIENCY     Past Surgical History:  Procedure Laterality Date   BREAST SURGERY  1998   Implants   hypertension post partum  09/2006   Not precalmpsia    Social History   Socioeconomic History   Marital status: Married    Spouse name: [name]   Number of children: 1   Years of education: 14   Highest education level: Not on file  Occupational History   Occupation: [employer]  Tobacco Use   Smoking status: Former    Packs/day: 0.25    Years: 8.00    Pack years: 2.00    Types: Cigarettes    Quit date: 05/09/2005    Years since quitting: 16.0   Smokeless tobacco: Never   Tobacco comments:    Married, lives with spouse and son. 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  smoked socially  off and on until she became pregnant.  Vaping Use   Vaping Use: Never used  Substance and Sexual Activity   Alcohol use: Yes    Comment: social   Drug use: No   Sexual activity: Not on file  Other Topics Concern   Not on file  Social History Narrative   Right handed    Coffee daily   Lives with husband and son. She also takes care of her mother who has Alzheimers   Social Determinants of Radio broadcast assistant Strain: Not on file  Food Insecurity: Not on file  Transportation Needs: Not on file  Physical Activity: Not on file  Stress: Not on file  Social  Connections: Not on file    Family History  Problem Relation Age of Onset   Hypertension Mother    Dementia Mother    Other Father        MVA   Diabetes Maternal Grandfather    Asthma Brother    Rheum arthritis Maternal Aunt    Colon cancer Neg Hx    Stomach cancer Neg Hx  Rectal cancer Neg Hx     Review of Systems  Constitutional:  Negative for chills and fever.  Eyes:  Negative for visual disturbance.  Respiratory:  Negative for cough, shortness of breath and wheezing.   Cardiovascular:  Positive for palpitations and leg swelling (mild at end of day). Negative for chest pain.  Gastrointestinal:  Negative for abdominal pain, blood in stool, constipation, diarrhea and nausea.       No gerd  Genitourinary:  Negative for dysuria.  Musculoskeletal:  Positive for arthralgias (mild oa). Negative for back pain.  Skin:  Negative for rash.  Neurological:  Positive for dizziness (rare) and headaches. Negative for light-headedness.  Psychiatric/Behavioral:  Positive for dysphoric mood. Negative for sleep disturbance. The patient is nervous/anxious.       Objective:   Vitals:   06/08/21 0851  BP: 132/78  Pulse: 84  Temp: 97.9 F (36.6 C)  SpO2: 97%   Filed Weights   06/08/21 0851  Weight: 198 lb (89.8 kg)   Body mass index is 35.07 kg/m.  BP Readings from Last 3 Encounters:  06/08/21 132/78  11/30/20 124/78  06/01/20 126/82    Wt Readings from Last 3 Encounters:  06/08/21 198 lb (89.8 kg)  11/30/20 206 lb (93.4 kg)  06/01/20 206 lb (93.4 kg)    Depression screen [facility] 2/9 11/30/2020 09/16/2019 05/29/2018 05/24/2017  Decreased Interest 0 0 0 0  Down, Depressed, Hopeless 0 0 0 1  PHQ - 2 Score 0 0 0 1  Altered sleeping - - - 1  Tired, decreased energy - - - 1  Change in appetite - - - 0  Feeling bad or failure about yourself  - - - 1  Trouble concentrating - - - 1  Moving slowly or fidgety/restless - - - 0  Suicidal thoughts - - - 0  PHQ-9 Score - - - 5      No flowsheet data found.     Physical Exam Constitutional: She appears well-developed and well-nourished. No distress.  HENT:  Head: Normocephalic and atraumatic.  Right Ear: External ear normal. Normal ear canal and TM Left Ear: External ear normal.  Normal ear canal and TM Mouth/Throat: Oropharynx is clear and moist.  Eyes: Conjunctivae and EOM are normal.  Neck: Neck supple. No tracheal deviation present. No thyromegaly present.  No carotid bruit  Cardiovascular: Normal rate, regular rhythm and normal heart sounds.   No murmur heard.  No edema. Pulmonary/Chest: Effort normal and breath sounds normal. No respiratory distress. She has no wheezes. She has no rales.  Breast: deferred   Abdominal: Soft. She exhibits no distension. There is no tenderness.  Lymphadenopathy: She has no cervical adenopathy.  Skin: Skin is warm and dry. She is not diaphoretic.  Psychiatric: She has a normal mood and affect. Her behavior is normal.   Diabetic Foot Exam - Simple   Simple Foot Form Diabetic Foot exam was performed with the following findings: Yes   Visual Inspection No deformities, no ulcerations, no other skin breakdown bilaterally: Yes See comments: Yes Sensation Testing Intact to touch and monofilament testing bilaterally: Yes Pulse Check Posterior Tibialis and Dorsalis pulse intact bilaterally: Yes Comments Bilateral onychomycosis      Lab Results  Component Value Date   WBC 6.9 01/20/2021   HGB 14.0 01/20/2021   HCT 42.6 01/20/2021   PLT 280 01/20/2021   GLUCOSE 103 (H) 11/30/2020   CHOL 160 11/30/2020   TRIG 318.0 (H) 11/30/2020   HDL 31.70 (L)  11/30/2020   LDLDIRECT 101.0 11/30/2020   LDLCALC 102 (H) 01/20/2020   ALT 34 11/30/2020   AST 23 11/30/2020   NA 143 11/30/2020   K 3.4 (L) 11/30/2020   CL 104 11/30/2020   CREATININE 0.90 11/30/2020   BUN 14 11/30/2020   CO2 30 11/30/2020   TSH 1.35 06/01/2020   HGBA1C 6.6 (H) 11/30/2020   MICROALBUR <0.7  11/30/2020         Assessment & Plan:   Physical exam: Screening blood work  ordered Exercise not regular-Will go back to the gym hopefully soon Weight working on weight loss Substance abuse  none   Reviewed recommended immunizations.   Health Maintenance  Topic Date Due   COVID-19 Vaccine (1) Never done   OPHTHALMOLOGY EXAM  Never done   FOOT EXAM  11/28/2019   INFLUENZA VACCINE  Never done   HEMOGLOBIN A1C  06/02/2021   MAMMOGRAM  11/14/2021   URINE MICROALBUMIN  11/30/2021   PAP SMEAR-Modifier  01/31/2022   COLONOSCOPY (Pts 45-53yrs Insurance coverage will need to be confirmed)  09/14/2022   TETANUS/TDAP  05/29/2028   Hepatitis C Screening  Completed   HIV Screening  Completed   Zoster Vaccines- Shingrix  Completed   HPV VACCINES  Aged Out          See Problem List for Assessment and Plan of chronic medical problems.

## 2021-06-07 NOTE — Patient Instructions (Addendum)
Blood work was ordered.  Blood work in 3- 4 weeks.    Medications changes include :   terbinafine 250 mg daily x 3 months  Your prescription(s) have been submitted to your pharmacy. Please take as directed and contact our office if you believe you are having problem(s) with the medication(s).    Please followup in 6 months   Health Maintenance, Female Adopting a healthy lifestyle and getting preventive care are important in promoting health and wellness. Ask your health care provider about: The right schedule for you to have regular tests and exams. Things you can do on your own to prevent diseases and keep yourself healthy. What should I know about diet, weight, and exercise? Eat a healthy diet  Eat a diet that includes plenty of vegetables, fruits, low-fat dairy products, and lean protein. Do not eat a lot of foods that are high in solid fats, added sugars, or sodium. Maintain a healthy weight Body mass index (BMI) is used to identify weight problems. It estimates body fat based on height and weight. Your health care provider can help determine your BMI and help you achieve or maintain a healthy weight. Get regular exercise Get regular exercise. This is one of the most important things you can do for your health. Most adults should: Exercise for at least 150 minutes each week. The exercise should increase your heart rate and make you sweat (moderate-intensity exercise). Do strengthening exercises at least twice a week. This is in addition to the moderate-intensity exercise. Spend less time sitting. Even light physical activity can be beneficial. Watch cholesterol and blood lipids Have your blood tested for lipids and cholesterol at 55 years of age, then have this test every 5 years. Have your cholesterol levels checked more often if: Your lipid or cholesterol levels are high. You are older than 55 years of age. You are at high risk for heart disease. What should I know about  cancer screening? Depending on your health history and family history, you may need to have cancer screening at various ages. This may include screening for: Breast cancer. Cervical cancer. Colorectal cancer. Skin cancer. Lung cancer. What should I know about heart disease, diabetes, and high blood pressure? Blood pressure and heart disease High blood pressure causes heart disease and increases the risk of stroke. This is more likely to develop in people who have high blood pressure readings or are overweight. Have your blood pressure checked: Every 3-5 years if you are 69-75 years of age. Every year if you are 52 years old or older. Diabetes Have regular diabetes screenings. This checks your fasting blood sugar level. Have the screening done: Once every three years after age 54 if you are at a normal weight and have a low risk for diabetes. More often and at a younger age if you are overweight or have a high risk for diabetes. What should I know about preventing infection? Hepatitis B If you have a higher risk for hepatitis B, you should be screened for this virus. Talk with your health care provider to find out if you are at risk for hepatitis B infection. Hepatitis C Testing is recommended for: Everyone born from [birth year] through 1964. Anyone with known risk factors for hepatitis C. Sexually transmitted infections (STIs) Get screened for STIs, including gonorrhea and chlamydia, if: You are sexually active and are younger than 55 years of age. You are older than 55 years of age and your health care provider tells you that you are  at risk for this type of infection. Your sexual activity has changed since you were last screened, and you are at increased risk for chlamydia or gonorrhea. Ask your health care provider if you are at risk. Ask your health care provider about whether you are at high risk for HIV. Your health care provider may recommend a prescription medicine to help prevent HIV  infection. If you choose to take medicine to prevent HIV, you should first get tested for HIV. You should then be tested every 3 months for as long as you are taking the medicine. Pregnancy If you are about to stop having your period (premenopausal) and you may become pregnant, seek counseling before you get pregnant. Take 400 to 800 micrograms (mcg) of folic acid every day if you become pregnant. Ask for birth control (contraception) if you want to prevent pregnancy. Osteoporosis and menopause Osteoporosis is a disease in which the bones lose minerals and strength with aging. This can result in bone fractures. If you are 8 years old or older, or if you are at risk for osteoporosis and fractures, ask your health care provider if you should: Be screened for bone loss. Take a calcium or vitamin D supplement to lower your risk of fractures. Be given hormone replacement therapy (HRT) to treat symptoms of menopause. Follow these instructions at home: Alcohol use Do not drink alcohol if: Your health care provider tells you not to drink. You are pregnant, may be pregnant, or are planning to become pregnant. If you drink alcohol: Limit how much you have to: 0-1 drink a day. Know how much alcohol is in your drink. In the [place] one drink equals one 12 oz bottle of beer (355 mL), one 5 oz glass of wine (148 mL), or one 1 oz glass of hard liquor (44 mL). Lifestyle Do not use any products that contain nicotine or tobacco. These products include cigarettes, chewing tobacco, and vaping devices, such as e-cigarettes. If you need help quitting, ask your health care provider. Do not use street drugs. Do not share needles. Ask your health care provider for help if you need support or information about quitting drugs. General instructions Schedule regular health, dental, and eye exams. Stay current with your vaccines. Tell your health care provider if: You often feel depressed. You have ever been abused  or do not feel safe at home. Summary Adopting a healthy lifestyle and getting preventive care are important in promoting health and wellness. Follow your health care provider's instructions about healthy diet, exercising, and getting tested or screened for diseases. Follow your health care provider's instructions on monitoring your cholesterol and blood pressure. This information is not intended to replace advice given to you by your health care provider. Make sure you discuss any questions you have with your health care provider. Document Revised: 09/14/2020 Document Reviewed: 09/14/2020 Elsevier Patient Education  [place].

## 2021-06-08 ENCOUNTER — Ambulatory Visit (INDEPENDENT_AMBULATORY_CARE_PROVIDER_SITE_OTHER): Payer: BC Managed Care – PPO | Admitting: Internal Medicine

## 2021-06-08 ENCOUNTER — Other Ambulatory Visit: Payer: Self-pay

## 2021-06-08 ENCOUNTER — Encounter: Payer: Self-pay | Admitting: Internal Medicine

## 2021-06-08 VITALS — BP 132/78 | HR 84 | Temp 97.9°F | Ht 63.0 in | Wt 198.0 lb

## 2021-06-08 DIAGNOSIS — E1165 Type 2 diabetes mellitus with hyperglycemia: Secondary | ICD-10-CM

## 2021-06-08 DIAGNOSIS — F419 Anxiety disorder, unspecified: Secondary | ICD-10-CM

## 2021-06-08 DIAGNOSIS — B351 Tinea unguium: Secondary | ICD-10-CM

## 2021-06-08 DIAGNOSIS — Z0000 Encounter for general adult medical examination without abnormal findings: Secondary | ICD-10-CM | POA: Diagnosis not present

## 2021-06-08 DIAGNOSIS — G43809 Other migraine, not intractable, without status migrainosus: Secondary | ICD-10-CM

## 2021-06-08 DIAGNOSIS — F32A Depression, unspecified: Secondary | ICD-10-CM

## 2021-06-08 DIAGNOSIS — E7849 Other hyperlipidemia: Secondary | ICD-10-CM

## 2021-06-08 DIAGNOSIS — I10 Essential (primary) hypertension: Secondary | ICD-10-CM | POA: Diagnosis not present

## 2021-06-08 LAB — CBC WITH DIFFERENTIAL/PLATELET
Basophils Absolute: 0 10*3/uL (ref 0.0–0.1)
Basophils Relative: 0.5 % (ref 0.0–3.0)
Eosinophils Absolute: 0.2 10*3/uL (ref 0.0–0.7)
Eosinophils Relative: 2.7 % (ref 0.0–5.0)
HCT: 39.9 % (ref 36.0–46.0)
Hemoglobin: 13.5 g/dL (ref 12.0–15.0)
Lymphocytes Relative: 36.9 % (ref 12.0–46.0)
Lymphs Abs: 2.2 10*3/uL (ref 0.7–4.0)
MCHC: 33.8 g/dL (ref 30.0–36.0)
MCV: 90.7 fl (ref 78.0–100.0)
Monocytes Absolute: 0.6 10*3/uL (ref 0.1–1.0)
Monocytes Relative: 9.5 % (ref 3.0–12.0)
Neutro Abs: 3 10*3/uL (ref 1.4–7.7)
Neutrophils Relative %: 50.4 % (ref 43.0–77.0)
Platelets: 244 10*3/uL (ref 150.0–400.0)
RBC: 4.4 Mil/uL (ref 3.87–5.11)
RDW: 13.3 % (ref 11.5–15.5)
WBC: 6 10*3/uL (ref 4.0–10.5)

## 2021-06-08 LAB — COMPREHENSIVE METABOLIC PANEL
ALT: 32 U/L (ref 0–35)
AST: 26 U/L (ref 0–37)
Albumin: 4.5 g/dL (ref 3.5–5.2)
Alkaline Phosphatase: 35 U/L — ABNORMAL LOW (ref 39–117)
BUN: 16 mg/dL (ref 6–23)
CO2: 31 mEq/L (ref 19–32)
Calcium: 9.8 mg/dL (ref 8.4–10.5)
Chloride: 101 mEq/L (ref 96–112)
Creatinine, Ser: 0.99 mg/dL (ref 0.40–1.20)
GFR: 64.62 mL/min (ref >60.00)
Glucose, Bld: 117 mg/dL — ABNORMAL HIGH (ref 70–99)
Potassium: 3.4 mEq/L — ABNORMAL LOW (ref 3.5–5.1)
Sodium: 140 mEq/L (ref 135–145)
Total Bilirubin: 0.5 mg/dL (ref 0.2–1.2)
Total Protein: 7 g/dL (ref 6.0–8.3)

## 2021-06-08 LAB — LIPID PANEL
Cholesterol: 165 mg/dL (ref 0–200)
HDL: 36.1 mg/dL — ABNORMAL LOW (ref >39.00)
NonHDL: 129.36
Total CHOL/HDL Ratio: 5
Triglycerides: 247 mg/dL — ABNORMAL HIGH (ref 0.0–149.0)
VLDL: 49.4 mg/dL — ABNORMAL HIGH (ref 0.0–40.0)

## 2021-06-08 LAB — TSH: TSH: 1.04 u[IU]/mL (ref 0.35–5.50)

## 2021-06-08 LAB — HEMOGLOBIN A1C: Hgb A1c MFr Bld: 6.4 % (ref 4.6–6.5)

## 2021-06-08 LAB — LDL CHOLESTEROL, DIRECT: Direct LDL: 106 mg/dL

## 2021-06-08 MED ORDER — TERBINAFINE HCL 250 MG PO TABS: 250.0000 mg | ORAL_TABLET | Freq: Every day | ORAL | 0 refills | Status: DC

## 2021-06-08 NOTE — Assessment & Plan Note (Signed)
Chronic Regular exercise and healthy diet encouraged Check lipid panel  Continue atorvastatin 40 mg daily 

## 2021-06-08 NOTE — Assessment & Plan Note (Addendum)
Chronic Management per neurology On Ajovy once monthly Taking Nurtec as needed

## 2021-06-08 NOTE — Assessment & Plan Note (Signed)
Chronic BMi 35.07 with diabetes, htn Working on weight loss

## 2021-06-08 NOTE — Assessment & Plan Note (Signed)
Chronic Blood pressure well controlled CMP Continue amlodipine 5 mg daily, labetalol 200 mg 3 times daily, and Dyazide 37.5-25 mg daily

## 2021-06-08 NOTE — Assessment & Plan Note (Signed)
Acute Has bilateral toenail onychomycosis-has tried several topical treatments without improvement Discussed treatment options-would like to try oral medication to see if that helps.  Discussed risk of liver problems with the medication Check LFTs today Start terbinafine 250 mg daily for 12 weeks Recheck LFTs in 3-4 weeks

## 2021-06-08 NOTE — Assessment & Plan Note (Addendum)
Chronic Lab Results  Component Value Date   HGBA1C 6.6 (H) 11/30/2020   Has been well controlled Diabetic diet, regular exercise, weight loss Continue metformin 500 mg twice daily, Rybelsus 7 mg daily

## 2021-06-08 NOTE — Assessment & Plan Note (Signed)
Chronic Controlled, Stable Continue sertraline 50 mg nightly

## 2021-06-09 DIAGNOSIS — H2511 Age-related nuclear cataract, right eye: Secondary | ICD-10-CM | POA: Diagnosis not present

## 2021-06-10 ENCOUNTER — Other Ambulatory Visit: Payer: Self-pay | Admitting: Internal Medicine

## 2021-06-25 ENCOUNTER — Other Ambulatory Visit: Payer: Self-pay | Admitting: Internal Medicine

## 2021-06-30 DIAGNOSIS — H2512 Age-related nuclear cataract, left eye: Secondary | ICD-10-CM | POA: Diagnosis not present

## 2021-07-10 ENCOUNTER — Other Ambulatory Visit: Payer: Self-pay | Admitting: Internal Medicine

## 2021-07-10 DIAGNOSIS — I10 Essential (primary) hypertension: Secondary | ICD-10-CM

## 2021-07-19 DIAGNOSIS — Z01419 Encounter for gynecological examination (general) (routine) without abnormal findings: Secondary | ICD-10-CM | POA: Diagnosis not present

## 2021-07-19 DIAGNOSIS — Z124 Encounter for screening for malignant neoplasm of cervix: Secondary | ICD-10-CM | POA: Diagnosis not present

## 2021-07-19 DIAGNOSIS — Z1231 Encounter for screening mammogram for malignant neoplasm of breast: Secondary | ICD-10-CM | POA: Diagnosis not present

## 2021-07-19 DIAGNOSIS — Z1151 Encounter for screening for human papillomavirus (HPV): Secondary | ICD-10-CM | POA: Diagnosis not present

## 2021-07-19 DIAGNOSIS — Z1389 Encounter for screening for other disorder: Secondary | ICD-10-CM | POA: Diagnosis not present

## 2021-07-19 DIAGNOSIS — Z6836 Body mass index (BMI) 36.0-36.9, adult: Secondary | ICD-10-CM | POA: Diagnosis not present

## 2021-07-19 LAB — HM MAMMOGRAPHY

## 2021-07-28 ENCOUNTER — Encounter: Payer: Self-pay | Admitting: Internal Medicine

## 2021-07-29 ENCOUNTER — Encounter: Payer: Self-pay | Admitting: Internal Medicine

## 2021-07-29 NOTE — Progress Notes (Signed)
Virtual Visit via Video Note ? ?I connected with [name] on 07/29/21 at  3:40 PM EDT by a video enabled telemedicine application and verified that I am speaking with the correct person using two identifiers. ?  ?I discussed the limitations of evaluation and management by telemedicine and the availability of in person appointments. The patient expressed understanding and agreed to proceed. ? ?Present for the visit:  Myself, Dr Billey Gosling, Lowella Grip.  The patient is currently at home and I am in the office.   ? ?No referring provider.  ? ? ?History of Present Illness: ?This is an acute visit for anxiety and feeling overwhelmed. ? ?She cannot focus on a task and get something done.  Even doing laundry she is difficult with.  Her mom's dementia is progressing.  She is back in the office 3 days a week and that has made things worse-harder for her to get things done and keep an eye on her mother.  She feels like she is going to have a full blown panic attack.  She is afraid she can have a stroke or heart attack because she is so stressed. ? ? ?Sometimes she shakes a little or feels that inside.   ? ?She is trying to make a little time for herself, but never has time. ? ?She has difficulty sleeping some nights because her mother does not sleep.  Her hair has been falling out and she knows that is related to the stress. ? ?ROS  ? ? ?Social History  ? ?Socioeconomic History  ? Marital status: Married  ?  Spouse name: Johnathon  ? Number of children: 1  ? Years of education: 59  ? Highest education level: Not on file  ?Occupational History  ? Occupation: [employer]  ?Tobacco Use  ? Smoking status: Former  ?  Packs/day: 0.25  ?  Years: 8.00  ?  Pack years: 2.00  ?  Types: Cigarettes  ?  Quit date: 05/09/2005  ?  Years since quitting: 16.2  ? Smokeless tobacco: Never  ? Tobacco comments:  ?  Married, lives with spouse and son. 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  smoked socially   off and on until she became pregnant.  ?Vaping Use  ? Vaping Use: Never used  ?Substance and Sexual Activity  ? Alcohol use: Yes  ?  Comment: social  ? Drug use: No  ? Sexual activity: Not on file  ?Other Topics Concern  ? Not on file  ?Social History Narrative  ? Right handed   ? Coffee daily  ? Lives with husband and son. She also takes care of her mother who has Alzheimers  ? ?Social Determinants of Health  ? ?Financial Resource Strain: Not on file  ?Food Insecurity: Not on file  ?Transportation Needs: Not on file  ?Physical Activity: Not on file  ?Stress: Not on file  ?Social Connections: Not on file  ? ?  ?Observations/Objective: ?Appears well in NAD ?Breathing normally ?Mood is anxious ? ?Assessment and Plan: ? ?See Problem List for Assessment and Plan of chronic medical problems. ? ? ?Follow Up Instructions: ? ?  ?I discussed the assessment and treatment plan with the patient. The patient was provided an opportunity to ask questions and all were answered. The patient agreed with the plan and demonstrated an understanding of the instructions. ?  ?The patient was advised to call back or seek an in-person evaluation if the symptoms worsen or  if the condition fails to improve as anticipated. ? ? ? ?Binnie Rail, MD ? ?

## 2021-07-30 ENCOUNTER — Telehealth (INDEPENDENT_AMBULATORY_CARE_PROVIDER_SITE_OTHER): Payer: BC Managed Care – PPO | Admitting: Internal Medicine

## 2021-07-30 ENCOUNTER — Other Ambulatory Visit: Payer: Self-pay

## 2021-07-30 DIAGNOSIS — F32A Depression, unspecified: Secondary | ICD-10-CM

## 2021-07-30 DIAGNOSIS — F419 Anxiety disorder, unspecified: Secondary | ICD-10-CM | POA: Diagnosis not present

## 2021-07-30 MED ORDER — BUSPIRONE HCL 5 MG PO TABS: 5.0000 mg | ORAL_TABLET | Freq: Two times a day (BID) | ORAL | 5 refills | Status: DC | PRN

## 2021-07-30 MED ORDER — SERTRALINE HCL 100 MG PO TABS: 100.0000 mg | ORAL_TABLET | Freq: Every day | ORAL | 1 refills | Status: DC

## 2021-07-30 NOTE — Assessment & Plan Note (Signed)
Chronic ?Anxiety is not currently controlled and she is feeling very anxious, overwhelmed ?She is having difficulty concentrating and completing tasks, her hair is falling out, she feels shaky and feels panicky ?Increase sertraline to 100 mg daily ?Start BuSpar 5 mg twice daily as needed ?Discussed that if we get her anxiety and stress level better controlled the other symptoms will improve ?She will try to make time for herself ?She will let me know if her symptoms or not improving ?

## 2021-08-08 ENCOUNTER — Other Ambulatory Visit: Payer: Self-pay | Admitting: Internal Medicine

## 2021-08-23 ENCOUNTER — Other Ambulatory Visit: Payer: Self-pay | Admitting: Internal Medicine

## 2021-10-02 ENCOUNTER — Other Ambulatory Visit: Payer: Self-pay | Admitting: Internal Medicine

## 2021-10-13 ENCOUNTER — Other Ambulatory Visit: Payer: Self-pay | Admitting: Internal Medicine

## 2021-11-17 ENCOUNTER — Other Ambulatory Visit: Payer: Self-pay | Admitting: Internal Medicine

## 2021-11-23 ENCOUNTER — Other Ambulatory Visit: Payer: Self-pay

## 2021-11-23 ENCOUNTER — Emergency Department (HOSPITAL_BASED_OUTPATIENT_CLINIC_OR_DEPARTMENT_OTHER): Payer: BC Managed Care – PPO | Admitting: Radiology

## 2021-11-23 ENCOUNTER — Encounter (HOSPITAL_BASED_OUTPATIENT_CLINIC_OR_DEPARTMENT_OTHER): Payer: Self-pay

## 2021-11-23 DIAGNOSIS — S8002XA Contusion of left knee, initial encounter: Secondary | ICD-10-CM | POA: Diagnosis not present

## 2021-11-23 DIAGNOSIS — W1830XA Fall on same level, unspecified, initial encounter: Secondary | ICD-10-CM | POA: Diagnosis not present

## 2021-11-23 DIAGNOSIS — Z79899 Other long term (current) drug therapy: Secondary | ICD-10-CM | POA: Insufficient documentation

## 2021-11-23 DIAGNOSIS — Z23 Encounter for immunization: Secondary | ICD-10-CM | POA: Insufficient documentation

## 2021-11-23 DIAGNOSIS — I10 Essential (primary) hypertension: Secondary | ICD-10-CM | POA: Insufficient documentation

## 2021-11-23 DIAGNOSIS — T1490XA Injury, unspecified, initial encounter: Secondary | ICD-10-CM | POA: Diagnosis not present

## 2021-11-23 DIAGNOSIS — S8992XA Unspecified injury of left lower leg, initial encounter: Secondary | ICD-10-CM | POA: Diagnosis not present

## 2021-11-23 DIAGNOSIS — S81012A Laceration without foreign body, left knee, initial encounter: Secondary | ICD-10-CM | POA: Insufficient documentation

## 2021-11-23 DIAGNOSIS — M7989 Other specified soft tissue disorders: Secondary | ICD-10-CM | POA: Diagnosis not present

## 2021-11-23 NOTE — ED Triage Notes (Signed)
Patient here POV from Home.  Endorses approximately 1.5 Hours ago she was pulled by the Dog and injured her Lower Extremities onto the Ground Outside. Bruising and Abrasion to Right Medial Leg. Moderate 3-4 cm Laceration to Left Anterior Knee.   Unknown Tetanus Status. No Head Injury. No LOC.   NAD Noted during Triage. A&Ox4. GCS 15. BIB Wheelchair.

## 2021-11-24 ENCOUNTER — Emergency Department (HOSPITAL_BASED_OUTPATIENT_CLINIC_OR_DEPARTMENT_OTHER)
Admission: EM | Admit: 2021-11-24 | Discharge: 2021-11-24 | Disposition: A | Discharge status: Home / Self Care | Payer: BC Managed Care – PPO | Attending: Emergency Medicine | Admitting: Emergency Medicine

## 2021-11-24 DIAGNOSIS — S81012A Laceration without foreign body, left knee, initial encounter: Secondary | ICD-10-CM

## 2021-11-24 DIAGNOSIS — S8002XA Contusion of left knee, initial encounter: Secondary | ICD-10-CM

## 2021-11-24 MED ORDER — LIDOCAINE HCL (PF) 1 % IJ SOLN
5.0000 mL | Freq: Once | INTRAMUSCULAR | Status: AC
Start: 2021-11-24 — End: 2021-11-24
  Administered 2021-11-24: 5 mL via INTRADERMAL

## 2021-11-24 MED ORDER — TETANUS-DIPHTH-ACELL PERTUSSIS 5-2.5-18.5 LF-MCG/0.5 IM SUSY
0.5000 mL | PREFILLED_SYRINGE | Freq: Once | INTRAMUSCULAR | Status: AC
  Administered 2021-11-24: 0.5 mL via INTRAMUSCULAR
  Filled 2021-11-24: qty 0.5

## 2021-11-24 MED ORDER — LIDOCAINE HCL (PF) 1 % IJ SOLN
30.0000 mL | Freq: Once | INTRAMUSCULAR | Status: DC
  Filled 2021-11-24: qty 30

## 2021-11-24 NOTE — ED Notes (Signed)
Pt verbalizes understanding of discharge instructions. Opportunity for questioning and answers were provided. Pt discharged from ED to home with family.    

## 2021-11-24 NOTE — Discharge Instructions (Signed)
Local wound care with bacitracin and dressing changes twice daily.  Sutures are to be removed in 10 days.  Please follow-up with your primary doctor for this.  Wear Ace bandage for comfort and support.  Return to the emergency department if you develop redness surrounding the wound, pus draining from the wound, worsening pain, streaks up or down the leg, or for other new and concerning symptoms.

## 2021-11-24 NOTE — ED Provider Notes (Signed)
[facility] EMERGENC[facility] DEPT Provider Note   CSN: [id number] Arrival date & time: 11/23/21  1928     History  Chief Complaint  Patient presents with   [name] is a 56 y.o. female.  Patient is a 56 year old female with past medical history of hypertension, hyperlipidemia, osteoarthritis.  Patient presenting with complaints of a left knee injury.  She was walking her dog when the dog ran and pulled her forward with the leash causing her to fall on the ground.  She has a laceration and swelling to the left knee.  Last tetanus unknown.  Pain is worse with bearing weight and range of motion.  Pain relieved with rest.  The history is provided by the patient.       Home Medications Prior to Admission medications   Medication Sig Start Date End Date Taking? Authorizing Provider  amLODipine (NORVASC) 5 MG tablet TAKE 1 TABLET BY MOUTH DAILY GENERIC EQUIVALENT FOR NORVASC 07/12/21   Burns, Claudina Lick, MD  atorvastatin (LIPITOR) 40 MG tablet TAKE 1 TABLET BY MOUTH DAILY 07/12/21   Burns, Claudina Lick, MD  Azelastine HCl 137 MCG/SPRAY SOLN PLACE 1-2 SPRAYS INTO BOTH NOSTRILS 2 (TWO) TIMES DAILY. USE IN EACH NOSTRIL AS DIRECTED 11/17/21   Burns, Claudina Lick, MD  BESIVANCE 0.6 % SUSP Place 1 drop into the left eye 3 (three) times daily. 06/03/21   [provider]  busPIRone (BUSPAR) 5 MG tablet TAKE 1 TABLET (5 MG TOTAL) BY MOUTH 2 (TWO) TIMES DAILY AS NEEDED (ANXIETY). 08/23/21   Binnie Rail, MD  ciclopirox (LOPROX) 0.77 % cream APPLY TO AFFECTED AREA TWICE A DAY 10/28/20   [provider]  clotrimazole-betamethasone (LOTRISONE) cream APPLY TO AFFECTED AREA TWICE A DAY 10/28/20   Binnie Rail, MD  Diclofenac Sodium (PENNSAID) 2 % SOLN Pennsaid 20 mg/gram/actuation (2 %) topical soln in metered-dose pump  APPLY ONE PUMP TOPICALLY TO THE AFFECTED AREA TWICE DAILY AS NEEDED 01/22/20   Binnie Rail, MD  doxycycline (MONODOX) 50 MG capsule Take 50 mg by mouth 2  (two) times daily. 05/06/20   [provider]  Fremanezumab-vfrm (AJOVY) 225 MG/1.5ML SOAJ Inject 225 mg into the skin every 30 (thirty) days. 06/20/19   Melvenia Beam, MD  glucose blood (ONETOUCH VERIO) test strip Use as instructed to check sugar daily 01/20/20   Binnie Rail, MD  glucose blood test strip OneTouch Verio test strips  TEST 4X DAILY    [provider]  ketorolac (ACULAR) 0.5 % ophthalmic solution Place 1 drop into the left eye 4 (four) times daily. 06/03/21   [provider]  labetalol (NORMODYNE) 200 MG tablet TAKE TWO TABLETS BY MOUTH THREE TIMES DAILY, GENERIC EQUIVALENT FOR NORMODYNE 08/09/21   Binnie Rail, MD  metFORMIN (GLUCOPHAGE) 500 MG tablet TAKE 1 TABLET BY MOUTH TWICE DAILY WITH A MEAL 10/02/21   Burns, Claudina Lick, MD  nabumetone (RELAFEN) 500 MG tablet Take 1 tablet (500 mg total) by mouth 2 (two) times daily as needed. 12/01/20   Hilts, Legrand Como, MD  Olopatadine HCl (PAZEO) 0.7 % SOLN Pazeo 0.7 % eye drops  INSTILL 1 DROP INTO BOTH EYES ONCE A DAY AS NEEDED    [provider]  OneTouch Delica Lancets 49Q MISC Apply topically 4 (four) times daily. 11/19/19   [provider]  prednisoLONE acetate (PRED FORTE) 1 % ophthalmic suspension Place 1 drop into the left eye 4 (four) times daily. 06/03/21  [provider]  RESTASIS 0.05 % ophthalmic emulsion INSTILL 1 DROP INTO BOTH EYES TWICE A DAY INSTILL 1 DROP INTO BOTH EYES TWICE DAILY 01/03/19   [provider]  Rimegepant Sulfate (NURTEC) 75 MG TBDP Take 75 mg by mouth daily as needed (MIGRAINE). Max of 1 tablet per day. 06/20/19   Melvenia Beam, MD  RYBELSUS 7 MG TABS TAKE 1 TABLET BY MOUTH DAILY 07/12/21   Binnie Rail, MD  sertraline (ZOLOFT) 100 MG tablet Take 1 tablet (100 mg total) by mouth daily. 07/30/21   Binnie Rail, MD  terbinafine (LAMISIL) 250 MG tablet Take 1 tablet (250 mg total) by mouth daily. 06/08/21   Binnie Rail, MD  triamcinolone cream  (KENALOG) 0.1 % SMARTSIG:1 Application Topical 2-3 Times Daily 11/18/19   [provider]  triamterene-hydrochlorothiazide (DYAZIDE) 37.5-25 MG capsule TAKE ONE CAPSULE BY MOUTH DAILY 01/16/21   Binnie Rail, MD      Allergies    Lisinopril and Olmesartan medoxomil    Review of Systems   Review of Systems  All other systems reviewed and are negative.   Physical Exam Updated Vital Signs BP (!) 140/100 Comment: Pt has high BP and is on meds  Pulse 81   Temp 98.8 F (37.1 C)   Resp 18   Ht '5\' 3"'$  (1.6 m)   Wt 89.8 kg   SpO2 98%   BMI 35.07 kg/m  Physical Exam Vitals and nursing note reviewed.  Constitutional:      General: She is not in acute distress.    Appearance: Normal appearance. She is not ill-appearing.  HENT:     Head: Normocephalic and atraumatic.  Pulmonary:     Effort: Pulmonary effort is normal.  Musculoskeletal:     Comments: The left knee has some swelling overlying the patella.  There is perhaps a small effusion.  There is a 2.5 cm laceration noted lateral and inferior to the patella.  The laceration does not appear to be intra-articular.  Patient has pain with range of motion which somewhat limits exam, but there is no obvious laxity.  Skin:    General: Skin is warm and dry.  Neurological:     Mental Status: She is alert.     ED Results / Procedures / Treatments   Labs (all labs ordered are listed, but only abnormal results are displayed) Labs Reviewed - No data to display  EKG None  Radiology DG Tibia/Fibula Right  Result Date: 11/23/2021 CLINICAL DATA:  Fall injury EXAM: RIGHT TIBIA AND FIBULA - 2 VIEW COMPARISON:  None Available. FINDINGS: Generalized soft tissue edema.  No fracture or malalignment. IMPRESSION: No acute osseous abnormality. Electronically Signed   By: Donavan Foil M.D.   On: 11/23/2021 21:01   DG Knee Complete 4 Views Left  Result Date: 11/23/2021 CLINICAL DATA:  Fall EXAM: LEFT KNEE - COMPLETE 4+ VIEW COMPARISON:   None Available. FINDINGS: No fracture or malalignment. Minimal patellofemoral degenerative change. Soft tissue edema. Possible tiny knee effusion IMPRESSION: No acute osseous abnormality. Soft tissue swelling with possible tiny knee effusion Electronically Signed   By: Donavan Foil M.D.   On: 11/23/2021 21:00    Procedures Procedures    Medications Ordered in ED Medications  Tdap (BOOSTRIX) injection 0.5 mL (0.5 mLs Intramuscular Given 11/24/21 0055)  lidocaine (PF) (XYLOCAINE) 1 % injection 5 mL (5 mLs Intradermal Given 11/24/21 0057)    ED Course/ Medical Decision Making/ A&P  X-rays negative for fracture.  Knee appears stable and doubt internal derangement.  Laceration is repaired below.  Patient to be treated with local wound care, elevation, ice, rest, and follow-up as needed.  LACERATION REPAIR Performed by: Veryl Speak Authorized by: Veryl Speak Consent: Verbal consent obtained. Risks and benefits: risks, benefits and alternatives were discussed Consent given by: patient Patient identity confirmed: provided demographic data Prepped and Draped in normal sterile fashion Wound explored  Laceration Location: Left knee  Laceration Length: 2.5 cm  No Foreign Bodies seen or palpated  Anesthesia: local infiltration  Local anesthetic: lidocaine 1% without epinephrine  Anesthetic total: 5 ml  Irrigation method: syringe Amount of cleaning: standard  Skin closure: 3-0 Prolene  Number of sutures: 3  Technique: Simple interrupted  Patient tolerance: Patient tolerated the procedure well with no immediate complications.   Final Clinical Impression(s) / ED Diagnoses Final diagnoses:  None    Rx / DC Orders ED Discharge Orders     None         Veryl Speak, MD 11/24/21 [phone]

## 2021-12-06 ENCOUNTER — Encounter: Payer: Self-pay | Admitting: Internal Medicine

## 2021-12-06 NOTE — Progress Notes (Unsigned)
Subjective:    Patient ID: [name], female    DOB: 1965-08-18, 56 y.o.   MRN: [id number]     HPI Fredi is here for follow up of her chronic medical problems, including diabetes, HTN, HLD, anxiety, depression   19th dog pulled her and she landed on the ground - concrete and has several bruises/cuts on her lower legs.  She did hit her left knee and has some swelling and a lot of pain and stiffness.  She did go to the emergency room and x-rays were negative for acute fracture.  She is still having significant pain because of the amount of bruising and swelling she still has.    Medications and allergies reviewed with patient and updated if appropriate.  Current Outpatient Medications on File Prior to Visit  Medication Sig Dispense Refill   amLODipine (NORVASC) 5 MG tablet TAKE 1 TABLET BY MOUTH DAILY GENERIC EQUIVALENT FOR NORVASC 90 tablet 1   atorvastatin (LIPITOR) 40 MG tablet TAKE 1 TABLET BY MOUTH DAILY 90 tablet 3   Azelastine HCl 137 MCG/SPRAY SOLN PLACE 1-2 SPRAYS INTO BOTH NOSTRILS 2 (TWO) TIMES DAILY. USE IN EACH NOSTRIL AS DIRECTED 30 mL 1   BESIVANCE 0.6 % SUSP Place 1 drop into the left eye 3 (three) times daily.     busPIRone (BUSPAR) 5 MG tablet TAKE 1 TABLET (5 MG TOTAL) BY MOUTH 2 (TWO) TIMES DAILY AS NEEDED (ANXIETY). 180 tablet 2   ciclopirox (LOPROX) 0.77 % cream APPLY TO AFFECTED AREA TWICE A DAY     clotrimazole-betamethasone (LOTRISONE) cream APPLY TO AFFECTED AREA TWICE A DAY 30 g 0   Diclofenac Sodium (PENNSAID) 2 % SOLN Pennsaid 20 mg/gram/actuation (2 %) topical soln in metered-dose pump  APPLY ONE PUMP TOPICALLY TO THE AFFECTED AREA TWICE DAILY AS NEEDED 112 g 3   doxycycline (MONODOX) 50 MG capsule Take 50 mg by mouth 2 (two) times daily.     Fremanezumab-vfrm (AJOVY) 225 MG/1.5ML SOAJ Inject 225 mg into the skin every 30 (thirty) days. 1.5 mL 11   glucose blood (ONETOUCH VERIO) test strip Use as instructed to check sugar daily 100 strip 3    glucose blood test strip OneTouch Verio test strips  TEST 4X DAILY     ketorolac (ACULAR) 0.5 % ophthalmic solution Place 1 drop into the left eye 4 (four) times daily.     labetalol (NORMODYNE) 200 MG tablet TAKE TWO TABLETS BY MOUTH THREE TIMES DAILY, GENERIC EQUIVALENT FOR NORMODYNE 540 tablet 1   metFORMIN (GLUCOPHAGE) 500 MG tablet TAKE 1 TABLET BY MOUTH TWICE DAILY WITH A MEAL 180 tablet 0   Olopatadine HCl (PAZEO) 0.7 % SOLN Pazeo 0.7 % eye drops  INSTILL 1 DROP INTO BOTH EYES ONCE A DAY AS NEEDED     OneTouch Delica Lancets 35T MISC Apply topically 4 (four) times daily.     prednisoLONE acetate (PRED FORTE) 1 % ophthalmic suspension Place 1 drop into the left eye 4 (four) times daily.     RESTASIS 0.05 % ophthalmic emulsion INSTILL 1 DROP INTO BOTH EYES TWICE A DAY INSTILL 1 DROP INTO BOTH EYES TWICE DAILY     Rimegepant Sulfate (NURTEC) 75 MG TBDP Take 75 mg by mouth daily as needed (MIGRAINE). Max of 1 tablet per day. 10 tablet 10   RYBELSUS 7 MG TABS TAKE 1 TABLET BY MOUTH DAILY 90 tablet 1   sertraline (ZOLOFT) 100 MG tablet Take 1 tablet (100 mg total) by  mouth daily. 90 tablet 1   terbinafine (LAMISIL) 250 MG tablet Take 1 tablet (250 mg total) by mouth daily. 90 tablet 0   triamcinolone cream (KENALOG) 0.1 % SMARTSIG:1 Application Topical 2-3 Times Daily     triamterene-hydrochlorothiazide (DYAZIDE) 37.5-25 MG capsule TAKE ONE CAPSULE BY MOUTH DAILY 90 capsule 3   No current facility-administered medications on file prior to visit.     Review of Systems  Constitutional:  Negative for fever.  Respiratory:  Negative for cough, shortness of breath and wheezing.   Cardiovascular:  Positive for palpitations (occ) and leg swelling. Negative for chest pain.  Musculoskeletal:  Positive for arthralgias.  Neurological:  Positive for light-headedness (occ) and headaches.       Objective:   Vitals:   12/07/21 0928  BP: 130/62  Pulse: 80  Temp: 98 F (36.7 C)  SpO2: 98%   BP  Readings from Last 3 Encounters:  12/07/21 130/62  11/24/21 (!) 130/91  06/08/21 132/78   Wt Readings from Last 3 Encounters:  12/07/21 198 lb (89.8 kg)  11/23/21 197 lb 15.6 oz (89.8 kg)  06/08/21 198 lb (89.8 kg)   Body mass index is 35.07 kg/m.    Physical Exam Constitutional:      General: She is not in acute distress.    Appearance: Normal appearance.  HENT:     Head: Normocephalic and atraumatic.  Eyes:     Conjunctiva/sclera: Conjunctivae normal.  Cardiovascular:     Rate and Rhythm: Normal rate and regular rhythm.     Heart sounds: Normal heart sounds. No murmur heard. Pulmonary:     Effort: Pulmonary effort is normal. No respiratory distress.     Breath sounds: Normal breath sounds. No wheezing.  Musculoskeletal:        General: Swelling (Left knee effusion and swelling with tenderness with palpation from quadriceps down to tibial plateau) and tenderness (Tenderness palpation throughout left knee) present. No deformity.     Cervical back: Neck supple.     Right lower leg: No edema.     Left lower leg: No edema.  Lymphadenopathy:     Cervical: No cervical adenopathy.  Skin:    General: Skin is warm and dry.     Findings: Bruising (Bilateral lower extremities, left proximal lower extremity) present. No rash.  Neurological:     Mental Status: She is alert. Mental status is at baseline.  Psychiatric:        Mood and Affect: Mood normal.        Behavior: Behavior normal.        Lab Results  Component Value Date   WBC 6.0 06/08/2021   HGB 13.5 06/08/2021   HCT 39.9 06/08/2021   PLT 244.0 06/08/2021   GLUCOSE 117 (H) 06/08/2021   CHOL 165 06/08/2021   TRIG 247.0 (H) 06/08/2021   HDL 36.10 (L) 06/08/2021   LDLDIRECT 106.0 06/08/2021   LDLCALC 102 (H) 01/20/2020   ALT 32 06/08/2021   AST 26 06/08/2021   NA 140 06/08/2021   K 3.4 (L) 06/08/2021   CL 101 06/08/2021   CREATININE 0.99 06/08/2021   BUN 16 06/08/2021   CO2 31 06/08/2021   TSH 1.04  06/08/2021   HGBA1C 6.4 06/08/2021   MICROALBUR <0.7 11/30/2020     Assessment & Plan:    See Problem List for Assessment and Plan of chronic medical problems.

## 2021-12-06 NOTE — Patient Instructions (Addendum)
     Blood work was ordered.     Medications changes include :   stop sertraline and start lexapro 10 mg daily.   Take the buspirone 5 mg 2-3 times a day as needed.  Prednisone 40 mg daily x 4 days ( hold ibuprofen)   Your prescription(s) have been sent to your pharmacy.    A referral was ordered for [facility].     Return in about 6 months (around 06/09/2022) for Physical Exam.

## 2021-12-07 ENCOUNTER — Ambulatory Visit (INDEPENDENT_AMBULATORY_CARE_PROVIDER_SITE_OTHER): Payer: BC Managed Care – PPO | Admitting: Internal Medicine

## 2021-12-07 ENCOUNTER — Other Ambulatory Visit: Payer: Self-pay | Admitting: Internal Medicine

## 2021-12-07 VITALS — BP 130/62 | HR 80 | Temp 98.0°F | Ht 63.0 in | Wt 198.0 lb

## 2021-12-07 DIAGNOSIS — E1165 Type 2 diabetes mellitus with hyperglycemia: Secondary | ICD-10-CM

## 2021-12-07 DIAGNOSIS — S8012XA Contusion of left lower leg, initial encounter: Secondary | ICD-10-CM

## 2021-12-07 DIAGNOSIS — F419 Anxiety disorder, unspecified: Secondary | ICD-10-CM | POA: Diagnosis not present

## 2021-12-07 DIAGNOSIS — I10 Essential (primary) hypertension: Secondary | ICD-10-CM

## 2021-12-07 DIAGNOSIS — F32A Depression, unspecified: Secondary | ICD-10-CM

## 2021-12-07 DIAGNOSIS — M25462 Effusion, left knee: Secondary | ICD-10-CM

## 2021-12-07 DIAGNOSIS — M25562 Pain in left knee: Secondary | ICD-10-CM | POA: Insufficient documentation

## 2021-12-07 DIAGNOSIS — E7849 Other hyperlipidemia: Secondary | ICD-10-CM | POA: Diagnosis not present

## 2021-12-07 DIAGNOSIS — S8011XA Contusion of right lower leg, initial encounter: Secondary | ICD-10-CM

## 2021-12-07 DIAGNOSIS — T148XXA Other injury of unspecified body region, initial encounter: Secondary | ICD-10-CM | POA: Insufficient documentation

## 2021-12-07 LAB — LIPID PANEL
Cholesterol: 172 mg/dL (ref 0–200)
HDL: 37.8 mg/dL — ABNORMAL LOW (ref >39.00)
Total CHOL/HDL Ratio: 5
Triglycerides: 539 mg/dL — ABNORMAL HIGH (ref 0.0–149.0)

## 2021-12-07 LAB — COMPREHENSIVE METABOLIC PANEL
ALT: 60 U/L — ABNORMAL HIGH (ref 0–35)
AST: 41 U/L — ABNORMAL HIGH (ref 0–37)
Albumin: 4.6 g/dL (ref 3.5–5.2)
Alkaline Phosphatase: 70 U/L (ref 39–117)
BUN: 13 mg/dL (ref 6–23)
CO2: 29 mEq/L (ref 19–32)
Calcium: 9.8 mg/dL (ref 8.4–10.5)
Chloride: 100 mEq/L (ref 96–112)
Creatinine, Ser: 0.81 mg/dL (ref 0.40–1.20)
GFR: 81.93 mL/min (ref >60.00)
Glucose, Bld: 131 mg/dL — ABNORMAL HIGH (ref 70–99)
Potassium: 3.8 mEq/L (ref 3.5–5.1)
Sodium: 139 mEq/L (ref 135–145)
Total Bilirubin: 0.8 mg/dL (ref 0.2–1.2)
Total Protein: 7.4 g/dL (ref 6.0–8.3)

## 2021-12-07 LAB — MICROALBUMIN / CREATININE URINE RATIO
Creatinine,U: 89.1 mg/dL
Microalb Creat Ratio: 0.8 mg/g (ref 0.0–30.0)
Microalb, Ur: <0.7 mg/dL (ref 0.0–1.9)

## 2021-12-07 LAB — HEMOGLOBIN A1C: Hgb A1c MFr Bld: 6.2 % (ref 4.6–6.5)

## 2021-12-07 LAB — LDL CHOLESTEROL, DIRECT: Direct LDL: 78 mg/dL

## 2021-12-07 MED ORDER — ESCITALOPRAM OXALATE 10 MG PO TABS: 10.0000 mg | ORAL_TABLET | Freq: Every day | ORAL | 5 refills | Status: DC

## 2021-12-07 MED ORDER — RYBELSUS 7 MG PO TABS: 1.0000 | ORAL_TABLET | Freq: Every day | ORAL | 1 refills | Status: DC

## 2021-12-07 MED ORDER — LABETALOL HCL 200 MG PO TABS: ORAL_TABLET | ORAL | 1 refills | Status: DC

## 2021-12-07 MED ORDER — TRIAMTERENE-HCTZ 37.5-25 MG PO CAPS: 1.0000 | ORAL_CAPSULE | Freq: Every day | ORAL | 2 refills | Status: DC

## 2021-12-07 MED ORDER — AMLODIPINE BESYLATE 5 MG PO TABS: ORAL_TABLET | ORAL | 1 refills | Status: DC

## 2021-12-07 MED ORDER — PREDNISONE 20 MG PO TABS: 40.0000 mg | ORAL_TABLET | Freq: Every day | ORAL | 0 refills | Status: AC

## 2021-12-07 MED ORDER — METFORMIN HCL 500 MG PO TABS: 500.0000 mg | ORAL_TABLET | Freq: Two times a day (BID) | ORAL | 1 refills | Status: DC

## 2021-12-07 NOTE — Assessment & Plan Note (Addendum)
Chronic Depression controlled Anxiety not controlled We had increased sertraline to 100 mg but that caused tremors - she decreased it back to 50 mg daily Stop sertraline and start lexapro 10 mg daily  Continue buspirone 5 mg 2-3 times daily as needed

## 2021-12-07 NOTE — Assessment & Plan Note (Signed)
Chronic Blood pressure well controlled CMP Continue amlodipine 5 mg daily, labetalol 400 mg 3 times daily, triamterene-HCTZ 37.5-25 mg daily 

## 2021-12-07 NOTE — Assessment & Plan Note (Signed)
Chronic  Lab Results  Component Value Date   HGBA1C 6.4 06/08/2021   Sugars well controlled Check A1c, urine microalbumin today Continue Rybelsus 7 mg daily, metformin 500 mg twice daily Stressed regular exercise, diabetic diet

## 2021-12-07 NOTE — Assessment & Plan Note (Signed)
Acute Significant bruising bilateral lower extremities since fall 7/19 There has been improvement Is having pain related to bruising and swelling Can try heating pad Continue topical lidocaine around left knee Elevate legs, can wrap legs Ibuprofen as needed after she completes the prednisone

## 2021-12-07 NOTE — Assessment & Plan Note (Signed)
Chronic Check lipid panel  Continue atorvastatin 40 mg daily Regular exercise and healthy diet encouraged  

## 2021-12-07 NOTE — Assessment & Plan Note (Signed)
Acute Related to fall on 7/19-her dog pulled her and she landed on her knee Has bilateral lower extremity bruising related to the fall, abrasion overlying left knee Left knee is swollen, stiff and painful X-ray negative for fracture in the ED on that day Concern for internal derangement with effusion She does not like injections-we will try prednisone 40 mg daily x4 days to see if that helps with some of the inflammation and pain/stiffness Will refer tomorrow to Ortho for further evaluation and treatment

## 2021-12-08 ENCOUNTER — Encounter: Payer: Self-pay | Admitting: Internal Medicine

## 2021-12-08 MED ORDER — ICOSAPENT ETHYL 1 G PO CAPS
2.0000 g | ORAL_CAPSULE | Freq: Two times a day (BID) | ORAL | 5 refills | Status: DC
Start: 2021-12-08 — End: 2022-06-08

## 2021-12-18 ENCOUNTER — Other Ambulatory Visit: Payer: Self-pay | Admitting: Internal Medicine

## 2021-12-25 ENCOUNTER — Other Ambulatory Visit: Payer: Self-pay | Admitting: Internal Medicine

## 2021-12-30 ENCOUNTER — Other Ambulatory Visit: Payer: Self-pay | Admitting: Internal Medicine

## 2022-01-20 DIAGNOSIS — M79604 Pain in right leg: Secondary | ICD-10-CM | POA: Diagnosis not present

## 2022-01-20 DIAGNOSIS — M25562 Pain in left knee: Secondary | ICD-10-CM | POA: Diagnosis not present

## 2022-02-03 ENCOUNTER — Encounter: Payer: Self-pay | Admitting: Family Medicine

## 2022-02-03 ENCOUNTER — Ambulatory Visit (INDEPENDENT_AMBULATORY_CARE_PROVIDER_SITE_OTHER): Payer: BC Managed Care – PPO | Admitting: Family Medicine

## 2022-02-03 VITALS — BP 132/94 | HR 95 | Temp 98.6°F | Ht 63.0 in | Wt 199.0 lb

## 2022-02-03 DIAGNOSIS — R52 Pain, unspecified: Secondary | ICD-10-CM | POA: Diagnosis not present

## 2022-02-03 DIAGNOSIS — J029 Acute pharyngitis, unspecified: Secondary | ICD-10-CM | POA: Diagnosis not present

## 2022-02-03 DIAGNOSIS — M791 Myalgia, unspecified site: Secondary | ICD-10-CM | POA: Diagnosis not present

## 2022-02-03 DIAGNOSIS — H9203 Otalgia, bilateral: Secondary | ICD-10-CM

## 2022-02-03 LAB — POCT INFLUENZA A/B
Influenza A, POC: NEGATIVE
Influenza B, POC: NEGATIVE

## 2022-02-03 LAB — POCT RAPID STREP A (OFFICE): Rapid Strep A Screen: NEGATIVE

## 2022-02-03 LAB — POC COVID19 BINAXNOW: SARS Coronavirus 2 Ag: NEGATIVE

## 2022-02-03 NOTE — Progress Notes (Signed)
Subjective:  [name] is a 56 y.o. female who presents for a 3 day hx of sore throat. She developed body aches, fatigue, ear pain, rhinorrhea, nasal congestion and mild dry cough yesterday.   Denies dizziness, chest pain, palpitations, shortness of breath, N/V.   Treatment to date: ibuprofen  No other aggravating or relieving factors.  No other c/o.  ROS as in subjective.   Objective: Vitals:   02/03/22 0909  BP: (!) 132/94  Pulse: 95  Temp: 98.6 F (37 C)  SpO2: 97%    General appearance: Alert, WD/WN, no distress, mildly ill appearing                             Skin: warm, no rash                           Head: mild maxillary sinus tenderness                            Eyes: conjunctiva normal, corneas clear, PERRLA                            Ears: pearly TMs, external ear canals normal                          Nose: septum midline, turbinates swollen, with erythema and clear discharge             Mouth/throat: MMM, tongue normal, mild pharyngeal erythema, no edema or exudate                           Neck: supple, no adenopathy, no thyromegaly, nontender                          Heart: RRR                         Lungs: CTA bilaterally, no wheezes, rales, or rhonchi      Assessment: Acute pharyngitis, unspecified etiology  Body aches - Plan: POC COVID-19, POCT Influenza A/B  Sore throat - Plan: POC COVID-19, POCT Influenza A/B, POCT rapid strep A  Otalgia of both ears   Plan: Negative Covid test.  Negative flu test Negative rapid strep test.  Discussed diagnosis and treatment viral syndrome.  Declines steroid injection for acute pharyngitis. Suggested symptomatic OTC remedies. Nasal saline spray for congestion.  Tylenol or Ibuprofen OTC for fever and malaise.  Call/return tomorrow if any new or worsening symptoms.

## 2022-02-03 NOTE — Patient Instructions (Signed)
Continue taking ibuprofen and using Flonase.  You may need to consider a multi-symptom cold medication with a decongestant and antihistamine such as Tylenol cold and flu or similar.  I recommend salt water gargles, throat lozenges and staying well hydrated.   Take another Covid test in the morning.   Send a Pharmacist, community message or call tomorrow to let me know how you are doing or if you have any new or worsening symptoms.    Sore Throat When you have a sore throat, your throat may feel: Tender. Burning. Irritated. Scratchy. Painful when you swallow. Painful when you talk. Many things can cause a sore throat, such as: An infection. Allergies. Dry air. Smoke or pollution. Radiation treatment for cancer. Gastroesophageal reflux disease (GERD). A tumor. A sore throat can be the first sign of another sickness. It can happen with other problems, like: Coughing. Sneezing. Fever. Swelling of the glands in the neck. Most sore throats go away without treatment. Follow these instructions at home:     Medicines Take over-the-counter and prescription medicines only as told by your doctor. Children often get sore throats. Do not give your child aspirin. Use throat sprays to soothe your throat as told by your health care provider. Managing pain To help with pain: Sip warm liquids, such as broth, herbal tea, or warm water. Eat or drink cold or frozen liquids, such as frozen ice pops. Rinse your mouth (gargle) with a salt water mixture 3-4 times a day or as needed. To make salt water, dissolve -1 tsp (3-6 g) of salt in 1 cup (237 mL) of warm water. Do not swallow this mixture. Suck on hard candy or throat lozenges. Put a cool-mist humidifier in your bedroom at night. Sit in the bathroom with the door closed for 5-10 minutes while you run hot water in the shower. General instructions Do not smoke or use any products that contain nicotine or tobacco. If you need help quitting, ask your  doctor. Get plenty of rest. Drink enough fluid to keep your pee (urine) pale yellow. Wash your hands often for at least 20 seconds with soap and water. If soap and water are not available, use hand sanitizer. Contact a doctor if: You have a fever for more than 2-3 days. You keep having symptoms for more than 2-3 days. Your throat does not get better in 7 days. You have a fever and your symptoms suddenly get worse. Your child who is 3 months to 106 years old has a temperature of 102.69F (39C) or higher. Get help right away if: You have trouble breathing. You cannot swallow fluids, soft foods, or your spit. You have swelling in your throat or neck that gets worse. You feel like you may vomit (nauseous) and this feeling lasts a long time. You cannot stop vomiting. These symptoms may be an emergency. Get help right away. Call your local emergency services (911 in the [place] Do not wait to see if the symptoms will go away. Do not drive yourself to the hospital. Summary A sore throat is a painful, burning, irritated, or scratchy throat. Many things can cause a sore throat. Take over-the-counter medicines only as told by your doctor. Get plenty of rest. Drink enough fluid to keep your pee (urine) pale yellow. Contact a doctor if your symptoms get worse or your sore throat does not get better within 7 days. This information is not intended to replace advice given to you by your health care provider. Make sure you discuss any  questions you have with your health care provider. Document Revised: 07/22/2020 Document Reviewed: 07/22/2020 Elsevier Patient Education  [place].

## 2022-02-04 ENCOUNTER — Encounter: Payer: Self-pay | Admitting: Family Medicine

## 2022-02-07 ENCOUNTER — Encounter (HOSPITAL_BASED_OUTPATIENT_CLINIC_OR_DEPARTMENT_OTHER): Payer: Self-pay | Admitting: Emergency Medicine

## 2022-02-07 ENCOUNTER — Other Ambulatory Visit (HOSPITAL_BASED_OUTPATIENT_CLINIC_OR_DEPARTMENT_OTHER): Payer: Self-pay

## 2022-02-07 ENCOUNTER — Other Ambulatory Visit: Payer: Self-pay

## 2022-02-07 ENCOUNTER — Emergency Department (HOSPITAL_BASED_OUTPATIENT_CLINIC_OR_DEPARTMENT_OTHER)
Admission: EM | Admit: 2022-02-07 | Discharge: 2022-02-07 | Disposition: A | Discharge status: Home / Self Care | Payer: BC Managed Care – PPO | Attending: Emergency Medicine | Admitting: Emergency Medicine

## 2022-02-07 DIAGNOSIS — Z20822 Contact with and (suspected) exposure to covid-19: Secondary | ICD-10-CM | POA: Diagnosis not present

## 2022-02-07 DIAGNOSIS — J069 Acute upper respiratory infection, unspecified: Secondary | ICD-10-CM | POA: Diagnosis not present

## 2022-02-07 DIAGNOSIS — J209 Acute bronchitis, unspecified: Secondary | ICD-10-CM | POA: Insufficient documentation

## 2022-02-07 DIAGNOSIS — R5383 Other fatigue: Secondary | ICD-10-CM | POA: Diagnosis not present

## 2022-02-07 DIAGNOSIS — R070 Pain in throat: Secondary | ICD-10-CM

## 2022-02-07 LAB — GROUP A STREP BY PCR: Group A Strep by PCR: NOT DETECTED

## 2022-02-07 LAB — RESP PANEL BY RT-PCR (FLU A&B, COVID) ARPGX2
Influenza A by PCR: NEGATIVE
Influenza B by PCR: NEGATIVE
SARS Coronavirus 2 by RT PCR: NEGATIVE

## 2022-02-07 MED ORDER — DEXAMETHASONE 4 MG PO TABS
10.0000 mg | ORAL_TABLET | Freq: Once | ORAL | Status: AC
  Administered 2022-02-07: 10 mg via ORAL
  Filled 2022-02-07: qty 3

## 2022-02-07 MED ORDER — AMOXICILLIN 500 MG PO CAPS
1000.0000 mg | ORAL_CAPSULE | Freq: Every day | ORAL | 0 refills | Status: AC
  Filled 2022-02-07: qty 20, 10d supply, fill #0

## 2022-02-07 NOTE — Telephone Encounter (Signed)
Patient called after hours line, in regards to this.   Nurse Assessment Nurse: Radford Pax, RN, Eugene Garnet Date/Time Eilene Ghazi Time): 02/07/2022 8:34:41 AM Confirm and document reason for call. If symptomatic, describe symptoms. ---Negative for covid,. Having cough, fever, sore throat, body aches. Does the patient have any new or worsening symptoms? ---Yes Will a triage be completed? ---Yes Related visit to physician within the last 2 weeks? ---Yes Does the PT have any chronic conditions? (i.e. diabetes, asthma, this includes High risk factors for pregnancy, etc.) ---Yes List chronic conditions. ---HTN, diabetes Is this a behavioral health or substance abuse call? ---No  Guidelines Guideline Title Affirmed Question Affirmed Notes Nurse Date/Time (Eastern Time) Influenza (Flu) - Seasonal Chest pain (Exception: MILD central chest pain, present only when coughing.) Turner, RN, Rebekah 02/07/2022 8:37:01 AM Disp. Time Eilene Ghazi Time) Disposition Final User 02/07/2022 8:40:26 AM Go to ED Now Yes Radford Pax, RN, Eugene Garnet 02/07/2022 8:40:26 AM Go to ED Now Yes Radford Pax, RN, Sharion Settler Disagree/Comply Comply Caller Understands Yes PreDisposition Call Doctor Care Advice Given Per Guideline GO TO ED NOW: * You need to be seen in the Emergency Department. CALL EMS 911 IF: * Severe difficulty breathing occurs * Lips or face turns blue * Confusion occurs. CARE ADVICE given per Influenza - Seasonal (Adult) guideline

## 2022-02-07 NOTE — ED Notes (Signed)
Patient verbalizes understanding of discharge instructions. Opportunity for questioning and answers were provided. Patient discharged from ED.  °

## 2022-02-07 NOTE — Telephone Encounter (Signed)
Tested negative for COVID but still experiencing symptoms. Please see nurse triage note

## 2022-02-07 NOTE — Telephone Encounter (Signed)
LM for pt to return my call

## 2022-02-07 NOTE — ED Triage Notes (Signed)
Pt arrives to ED with c/o sore throat, fatigue, body aches, right ear pain, fever x1 week.

## 2022-02-07 NOTE — ED Provider Notes (Signed)
[facility]PT Provider Note   CSN: [id number] Arrival date & time: 02/07/22  2542     History  Chief Complaint  Patient presents with   Fatigue    [name] is a 56 y.o. female.  Patient here with ongoing sore throat, cough, congestion.  Had negative strep and COVID test earlier this week.  Has not gotten better.  Continues to have congestion and pain in the throat.  No difficulty opening mouth.  No chest pain or shortness of breath or abdominal pain or nausea or vomiting.  History of diabetes.  No weakness or numbness or vision changes.  The history is provided by the patient.       Home Medications Prior to Admission medications   Medication Sig Start Date End Date Taking? Authorizing Provider  amoxicillin (AMOXIL) 500 MG capsule Take 2 capsules (1,000 mg total) by mouth daily for 10 days. 02/07/22 02/17/22 Yes Milta Croson, DO  amLODipine (NORVASC) 5 MG tablet TAKE 1 TABLET BY MOUTH DAILY GENERIC EQUIVALENT FOR NORVASC 12/07/21   Burns, Claudina Lick, MD  atorvastatin (LIPITOR) 40 MG tablet TAKE 1 TABLET BY MOUTH DAILY 07/12/21   Burns, Claudina Lick, MD  Azelastine HCl 137 MCG/SPRAY SOLN PLACE 1-2 SPRAYS INTO BOTH NOSTRILS 2 (TWO) TIMES DAILY. USE IN EACH NOSTRIL AS DIRECTED 12/20/21   Burns, Claudina Lick, MD  BESIVANCE 0.6 % SUSP Place 1 drop into the left eye 3 (three) times daily. 06/03/21   [provider]  busPIRone (BUSPAR) 5 MG tablet TAKE 1 TABLET (5 MG TOTAL) BY MOUTH 2 (TWO) TIMES DAILY AS NEEDED (ANXIETY). 08/23/21   Binnie Rail, MD  ciclopirox (LOPROX) 0.77 % cream APPLY TO AFFECTED AREA TWICE A DAY 10/28/20   [provider]  clotrimazole-betamethasone (LOTRISONE) cream APPLY TO AFFECTED AREA TWICE A DAY 10/28/20   Binnie Rail, MD  Diclofenac Sodium (PENNSAID) 2 % SOLN Pennsaid 20 mg/gram/actuation (2 %) topical soln in metered-dose pump  APPLY ONE PUMP TOPICALLY TO THE AFFECTED AREA TWICE DAILY AS NEEDED 01/22/20   Binnie Rail,  MD  doxycycline (MONODOX) 50 MG capsule Take 50 mg by mouth 2 (two) times daily. 05/06/20   [provider]  escitalopram (LEXAPRO) 10 MG tablet TAKE 1 TABLET BY MOUTH EVERY DAY 12/30/21   Burns, Claudina Lick, MD  Fremanezumab-vfrm (AJOVY) 225 MG/1.5ML SOAJ Inject 225 mg into the skin every 30 (thirty) days. 06/20/19   Melvenia Beam, MD  glucose blood (ONETOUCH VERIO) test strip Use as instructed to check sugar daily 01/20/20   Binnie Rail, MD  glucose blood test strip OneTouch Verio test strips  TEST 4X DAILY    [provider]  icosapent Ethyl (VASCEPA) 1 g capsule Take 2 capsules (2 g total) by mouth 2 (two) times daily. 12/08/21   Binnie Rail, MD  ketorolac (ACULAR) 0.5 % ophthalmic solution Place 1 drop into the left eye 4 (four) times daily. 06/03/21   [provider]  labetalol (NORMODYNE) 200 MG tablet TAKE TWO TABLETS BY MOUTH THREE TIMES DAILY 12/07/21   Binnie Rail, MD  metFORMIN (GLUCOPHAGE) 500 MG tablet Take 1 tablet (500 mg total) by mouth 2 (two) times daily with a meal. 12/07/21   Burns, Claudina Lick, MD  Olopatadine HCl (PAZEO) 0.7 % SOLN Pazeo 0.7 % eye drops  INSTILL 1 DROP INTO BOTH EYES ONCE A DAY AS NEEDED    [provider]  OneTouch Delica Lancets [address] [place]  topically 4 (four) times daily. 11/19/19   [provider]  prednisoLONE acetate (PRED FORTE) 1 % ophthalmic suspension Place 1 drop into the left eye 4 (four) times daily. 06/03/21   [provider]  RESTASIS 0.05 % ophthalmic emulsion INSTILL 1 DROP INTO BOTH EYES TWICE A DAY INSTILL 1 DROP INTO BOTH EYES TWICE DAILY 01/03/19   [provider]  Rimegepant Sulfate (NURTEC) 75 MG TBDP Take 75 mg by mouth daily as needed (MIGRAINE). Max of 1 tablet per day. 06/20/19   Melvenia Beam, MD  Semaglutide (RYBELSUS) 7 MG TABS Take 1 tablet by mouth daily. 12/07/21   Binnie Rail, MD  terbinafine (LAMISIL) 250 MG tablet Take 1 tablet (250 mg total) by mouth daily. 06/08/21    Binnie Rail, MD  triamcinolone cream (KENALOG) 0.1 % SMARTSIG:1 Application Topical 2-3 Times Daily 11/18/19   [provider]  triamterene-hydrochlorothiazide (DYAZIDE) 37.5-25 MG capsule Take 1 each (1 capsule total) by mouth daily. 12/07/21   Binnie Rail, MD      Allergies    Lisinopril and Olmesartan medoxomil    Review of Systems   Review of Systems  Physical Exam Updated Vital Signs BP (!) 153/101 (BP Location: Right Arm)   Pulse (!) 107   Temp 97.9 F (36.6 C)   Resp 20   SpO2 98%  Physical Exam Vitals and nursing note reviewed.  Constitutional:      General: She is not in acute distress.    Appearance: She is well-developed. She is not ill-appearing.  HENT:     Head: Normocephalic and atraumatic.     Nose: Congestion present.     Mouth/Throat:     Pharynx: Posterior oropharyngeal erythema present. No oropharyngeal exudate.  Eyes:     Extraocular Movements: Extraocular movements intact.     Conjunctiva/sclera: Conjunctivae normal.     Pupils: Pupils are equal, round, and reactive to light.  Cardiovascular:     Rate and Rhythm: Normal rate and regular rhythm.     Pulses: Normal pulses.     Heart sounds: Normal heart sounds. No murmur heard. Pulmonary:     Effort: Pulmonary effort is normal. No respiratory distress.     Breath sounds: Normal breath sounds.  Abdominal:     Palpations: Abdomen is soft.     Tenderness: There is no abdominal tenderness.  Musculoskeletal:        General: No swelling.     Cervical back: Normal range of motion and neck supple.  Skin:    General: Skin is warm and dry.     Capillary Refill: Capillary refill takes less than 2 seconds.  Neurological:     General: No focal deficit present.     Mental Status: She is alert.  Psychiatric:        Mood and Affect: Mood normal.     ED Results / Procedures / Treatments   Labs (all labs ordered are listed, but only abnormal results are displayed) Labs Reviewed  RESP PANEL  BY RT-PCR (FLU A&B, COVID) ARPGX2  GROUP A STREP BY PCR    EKG None  Radiology No results found.  Procedures Procedures    Medications Ordered in ED Medications  dexamethasone (DECADRON) tablet 10 mg (has no administration in time range)    ED Course/ Medical Decision Making/ A&P  Medical Decision Making Risk Prescription drug management.   Crete is here with sore throat and congestion.  Normal vitals.  No fever.  Comorbidities of diabetes.  Appears to have some erythema in the throat.  Differential diagnosis is viral process versus bronchitis versus strep throat.  Will swab for strep and COVID however will treat with antibiotics empirically.  No signs of trismus or drooling.  No concern for retropharyngeal abscess or peritonsillar abscess.  Vital signs are reassuring.  Breath sounds are clear.  Seems less likely to be pneumonia.  Understands return precautions.  Discharged in good condition.  We will have her follow-up with primary care doctor.  Will treat with Decadron for discomfort as well.  This chart was dictated using voice recognition software.  Despite best efforts to proofread,  errors can occur which can change the documentation meaning.         Final Clinical Impression(s) / ED Diagnoses Final diagnoses:  Acute upper respiratory infection  Acute bronchitis, unspecified organism  Throat pain    Rx / DC Orders ED Discharge Orders          Ordered    amoxicillin (AMOXIL) 500 MG capsule  Daily        02/07/22 0929              Lennice Sites, DO 02/07/22 [phone]

## 2022-02-08 ENCOUNTER — Encounter: Payer: Self-pay | Admitting: Internal Medicine

## 2022-02-10 ENCOUNTER — Encounter: Payer: Self-pay | Admitting: Internal Medicine

## 2022-02-10 MED ORDER — HYDROCOD POLI-CHLORPHE POLI ER 10-8 MG/5ML PO SUER: 5.0000 mL | Freq: Two times a day (BID) | ORAL | 0 refills | Status: DC | PRN

## 2022-02-12 ENCOUNTER — Other Ambulatory Visit: Payer: Self-pay | Admitting: Internal Medicine

## 2022-02-12 DIAGNOSIS — J4521 Mild intermittent asthma with (acute) exacerbation: Secondary | ICD-10-CM

## 2022-02-12 MED ORDER — IPRATROPIUM-ALBUTEROL 0.5-2.5 (3) MG/3ML IN SOLN: 3.0000 mL | Freq: Four times a day (QID) | RESPIRATORY_TRACT | 0 refills | Status: DC | PRN

## 2022-02-21 DIAGNOSIS — E119 Type 2 diabetes mellitus without complications: Secondary | ICD-10-CM | POA: Diagnosis not present

## 2022-02-21 LAB — HM DIABETES EYE EXAM

## 2022-02-22 NOTE — Progress Notes (Unsigned)
Subjective:    Patient ID: [name], female    DOB: 14-Apr-1966, 56 y.o.   MRN: [id number]      HPI [name] is here for  Chief Complaint  Patient presents with   follow up from urgent care     9/28 - saw [name] - cold symptoms.  Covid and flu neg.  Otc treatment  10/2 - [facility] - strep, flu negative. Received dexamethasone 10 mg. Dx with acute bronchitis, sore throat  - rx'd amoxicillin      Medications and allergies reviewed with patient and updated if appropriate.  Current Outpatient Medications on File Prior to Visit  Medication Sig Dispense Refill   amLODipine (NORVASC) 5 MG tablet TAKE 1 TABLET BY MOUTH DAILY GENERIC EQUIVALENT FOR NORVASC 90 tablet 1   atorvastatin (LIPITOR) 40 MG tablet TAKE 1 TABLET BY MOUTH DAILY 90 tablet 3   Azelastine HCl 137 MCG/SPRAY SOLN PLACE 1-2 SPRAYS INTO BOTH NOSTRILS 2 (TWO) TIMES DAILY. USE IN EACH NOSTRIL AS DIRECTED 30 mL 2   BESIVANCE 0.6 % SUSP Place 1 drop into the left eye 3 (three) times daily.     busPIRone (BUSPAR) 5 MG tablet TAKE 1 TABLET (5 MG TOTAL) BY MOUTH 2 (TWO) TIMES DAILY AS NEEDED (ANXIETY). 180 tablet 2   chlorpheniramine-HYDROcodone (TUSSIONEX) 10-8 MG/5ML Take 5 mLs by mouth every 12 (twelve) hours as needed for cough. 115 mL 0   ciclopirox (LOPROX) 0.77 % cream APPLY TO AFFECTED AREA TWICE A DAY     clotrimazole-betamethasone (LOTRISONE) cream APPLY TO AFFECTED AREA TWICE A DAY 30 g 0   Diclofenac Sodium (PENNSAID) 2 % SOLN Pennsaid 20 mg/gram/actuation (2 %) topical soln in metered-dose pump  APPLY ONE PUMP TOPICALLY TO THE AFFECTED AREA TWICE DAILY AS NEEDED 112 g 3   doxycycline (MONODOX) 50 MG capsule Take 50 mg by mouth 2 (two) times daily.     escitalopram (LEXAPRO) 10 MG tablet TAKE 1 TABLET BY MOUTH EVERY DAY 90 tablet 2   Fremanezumab-vfrm (AJOVY) 225 MG/1.5ML SOAJ Inject 225 mg into the skin every 30 (thirty) days. 1.5 mL 11   glucose blood (ONETOUCH VERIO) test strip Use as instructed  to check sugar daily 100 strip 3   glucose blood test strip OneTouch Verio test strips  TEST 4X DAILY     icosapent Ethyl (VASCEPA) 1 g capsule Take 2 capsules (2 g total) by mouth 2 (two) times daily. 120 capsule 5   ipratropium-albuterol (DUONEB) 0.5-2.5 (3) MG/3ML SOLN Take 3 mLs by nebulization every 6 (six) hours as needed. 360 mL 0   ketorolac (ACULAR) 0.5 % ophthalmic solution Place 1 drop into the left eye 4 (four) times daily.     labetalol (NORMODYNE) 200 MG tablet TAKE TWO TABLETS BY MOUTH THREE TIMES DAILY 540 tablet 1   metFORMIN (GLUCOPHAGE) 500 MG tablet Take 1 tablet (500 mg total) by mouth 2 (two) times daily with a meal. 180 tablet 1   Olopatadine HCl (PAZEO) 0.7 % SOLN Pazeo 0.7 % eye drops  INSTILL 1 DROP INTO BOTH EYES ONCE A DAY AS NEEDED     OneTouch Delica Lancets [address] MISC Apply topically 4 (four) times daily.     prednisoLONE acetate (PRED FORTE) 1 % ophthalmic suspension Place 1 drop into the left eye 4 (four) times daily.     RESTASIS 0.05 % ophthalmic emulsion INSTILL 1 DROP INTO BOTH EYES TWICE A DAY INSTILL 1 DROP INTO BOTH EYES TWICE DAILY  Rimegepant Sulfate (NURTEC) 75 MG TBDP Take 75 mg by mouth daily as needed (MIGRAINE). Max of 1 tablet per day. 10 tablet 10   Semaglutide (RYBELSUS) 7 MG TABS Take 1 tablet by mouth daily. 90 tablet 1   terbinafine (LAMISIL) 250 MG tablet Take 1 tablet (250 mg total) by mouth daily. 90 tablet 0   triamcinolone cream (KENALOG) 0.1 % SMARTSIG:1 Application Topical 2-3 Times Daily     triamterene-hydrochlorothiazide (DYAZIDE) 37.5-25 MG capsule Take 1 each (1 capsule total) by mouth daily. 90 capsule 2   No current facility-administered medications on file prior to visit.    Review of Systems     Objective:  There were no vitals filed for this visit. BP Readings from Last 3 Encounters:  02/07/22 (!) 153/101  02/03/22 (!) 132/94  12/07/21 130/62   Wt Readings from Last 3 Encounters:  02/03/22 199 lb (90.3 kg)   12/07/21 198 lb (89.8 kg)  11/23/21 197 lb 15.6 oz (89.8 kg)   There is no height or weight on file to calculate BMI.    Physical Exam         Assessment & Plan:    See Problem List for Assessment and Plan of chronic medical problems.

## 2022-02-23 ENCOUNTER — Ambulatory Visit (INDEPENDENT_AMBULATORY_CARE_PROVIDER_SITE_OTHER): Payer: BC Managed Care – PPO | Admitting: Internal Medicine

## 2022-02-23 ENCOUNTER — Encounter: Payer: Self-pay | Admitting: Internal Medicine

## 2022-02-23 VITALS — BP 126/78 | HR 93 | Temp 98.2°F | Ht 63.0 in | Wt 193.0 lb

## 2022-02-23 DIAGNOSIS — J209 Acute bronchitis, unspecified: Secondary | ICD-10-CM

## 2022-02-23 DIAGNOSIS — R42 Dizziness and giddiness: Secondary | ICD-10-CM

## 2022-02-23 DIAGNOSIS — G4709 Other insomnia: Secondary | ICD-10-CM

## 2022-02-23 MED ORDER — ZALEPLON 5 MG PO CAPS: 5.0000 mg | ORAL_CAPSULE | Freq: Every evening | ORAL | 2 refills | Status: DC | PRN

## 2022-02-23 NOTE — Patient Instructions (Signed)
       Medications changes include :   sonata 5-10 mg at bedtime.

## 2022-02-23 NOTE — Assessment & Plan Note (Signed)
Acute Having a few episodes of lightheadedness/dizziness or feeling off balance This started when she was sick and hopefully is related to that and will resolve on its own He is drinking plenty of water so unlikely dehydration Discussed it could also be her sugar Most likely related to her recent upper respiratory infection and possible some labyrinthitis She will let me know if the symptoms do not improve

## 2022-02-23 NOTE — Assessment & Plan Note (Addendum)
Chronic Not sleeping well - severely fatigued Has more difficulty falling asleep because her brain will not turn off, some difficulties getting back to sleep if she wakes up in the middle of the night Trazodone not effective Trial of sonata 5-10 mg nightly prn

## 2022-02-23 NOTE — Assessment & Plan Note (Signed)
Resolved Needed amoxicillin and symptoms have improved Mild residual cough-symptomatic treatment

## 2022-03-08 ENCOUNTER — Other Ambulatory Visit: Payer: Self-pay | Admitting: Internal Medicine

## 2022-03-08 DIAGNOSIS — J4521 Mild intermittent asthma with (acute) exacerbation: Secondary | ICD-10-CM

## 2022-03-26 ENCOUNTER — Other Ambulatory Visit: Payer: Self-pay | Admitting: Internal Medicine

## 2022-04-14 ENCOUNTER — Encounter: Payer: Self-pay | Admitting: Internal Medicine

## 2022-04-14 NOTE — Progress Notes (Signed)
Outside notes received. Information abstracted. Notes sent to scan.  

## 2022-04-25 ENCOUNTER — Other Ambulatory Visit: Payer: Self-pay | Admitting: Internal Medicine

## 2022-04-25 DIAGNOSIS — J4521 Mild intermittent asthma with (acute) exacerbation: Secondary | ICD-10-CM

## 2022-06-08 ENCOUNTER — Other Ambulatory Visit: Payer: Self-pay | Admitting: Internal Medicine

## 2022-06-09 ENCOUNTER — Encounter: Payer: Self-pay | Admitting: Internal Medicine

## 2022-06-09 NOTE — Patient Instructions (Addendum)
      Blood work was ordered.   The lab is on the first floor.     Medications changes include :   lunesta for sleep      Return in about 6 months (around 12/09/2022) for follow up.

## 2022-06-09 NOTE — Progress Notes (Signed)
Subjective:    Patient ID: [name], female    DOB: 03-27-1966, 57 y.o.   MRN: [id number]      HPI [name] is here for a Physical exam.   Not sleeping well.  Stress with caring for mom.   Earlier this week x 2 days - joints felt very stiff.  Fingers swell at times.  Feels she is OA.    Medications and allergies reviewed with patient and updated if appropriate.  Current Outpatient Medications on File Prior to Visit  Medication Sig Dispense Refill   amLODipine (NORVASC) 5 MG tablet TAKE 1 TABLET BY MOUTH DAILY GENERIC EQUIVALENT FOR NORVASC 90 tablet 1   atorvastatin (LIPITOR) 40 MG tablet TAKE 1 TABLET BY MOUTH DAILY 90 tablet 3   Azelastine HCl 137 MCG/SPRAY SOLN PLACE 1-2 SPRAYS INTO BOTH NOSTRILS 2 (TWO) TIMES DAILY. USE IN EACH NOSTRIL AS DIRECTED 30 mL 2   BESIVANCE 0.6 % SUSP Place 1 drop into the left eye 3 (three) times daily.     busPIRone (BUSPAR) 5 MG tablet TAKE 1 TABLET (5 MG TOTAL) BY MOUTH 2 (TWO) TIMES DAILY AS NEEDED (ANXIETY). 180 tablet 2   ciclopirox (LOPROX) 0.77 % cream APPLY TO AFFECTED AREA TWICE A DAY     clotrimazole-betamethasone (LOTRISONE) cream APPLY TO AFFECTED AREA TWICE A DAY 30 g 0   Diclofenac Sodium (PENNSAID) 2 % SOLN Pennsaid 20 mg/gram/actuation (2 %) topical soln in metered-dose pump  APPLY ONE PUMP TOPICALLY TO THE AFFECTED AREA TWICE DAILY AS NEEDED 112 g 3   doxycycline (MONODOX) 50 MG capsule Take 50 mg by mouth 2 (two) times daily.     escitalopram (LEXAPRO) 10 MG tablet TAKE 1 TABLET BY MOUTH EVERY DAY 90 tablet 2   Fremanezumab-vfrm (AJOVY) 225 MG/1.5ML SOAJ Inject 225 mg into the skin every 30 (thirty) days. 1.5 mL 11   glucose blood (ONETOUCH VERIO) test strip Use as instructed to check sugar daily 100 strip 3   glucose blood test strip OneTouch Verio test strips  TEST 4X DAILY     ipratropium-albuterol (DUONEB) 0.5-2.5 (3) MG/3ML SOLN Take 3 mLs by nebulization every 6 (six) hours as needed. 360 mL 0   ketorolac (ACULAR)  0.5 % ophthalmic solution Place 1 drop into the left eye 4 (four) times daily.     labetalol (NORMODYNE) 200 MG tablet TAKE TWO TABLETS BY MOUTH THREE TIMES DAILY 540 tablet 1   metFORMIN (GLUCOPHAGE) 500 MG tablet Take 1 tablet (500 mg total) by mouth 2 (two) times daily with a meal. 180 tablet 1   Olopatadine HCl (PAZEO) 0.7 % SOLN Pazeo 0.7 % eye drops  INSTILL 1 DROP INTO BOTH EYES ONCE A DAY AS NEEDED     OneTouch Delica Lancets 40[address] MISC Apply topically 4 (four) times daily.     prednisoLONE acetate (PRED FORTE) 1 % ophthalmic suspension Place 1 drop into the left eye 4 (four) times daily.     RESTASIS 0.05 % ophthalmic emulsion INSTILL 1 DROP INTO BOTH EYES TWICE A DAY INSTILL 1 DROP INTO BOTH EYES TWICE DAILY     Rimegepant Sulfate (NURTEC) 75 MG TBDP Take 75 mg by mouth daily as needed (MIGRAINE). Max of 1 tablet per day. 10 tablet 10   Semaglutide (RYBELSUS) 7 MG TABS Take 1 tablet by mouth daily. 90 tablet 1   terbinafine (LAMISIL) 250 MG tablet Take 1 tablet (250 mg total) by mouth daily. 90 tablet 0   triamcinolone cream (  KENALOG) 0.1 % SMARTSIG:1 Application Topical 2-3 Times Daily     triamterene-hydrochlorothiazide (DYAZIDE) 37.5-25 MG capsule Take 1 each (1 capsule total) by mouth daily. 90 capsule 2   VASCEPA 1 g capsule TAKE 2 CAPSULES BY MOUTH 2 TIMES DAILY. 120 capsule 5   No current facility-administered medications on file prior to visit.    Review of Systems  Constitutional:  Negative for fever.  Eyes:  Negative for visual disturbance.  Respiratory:  Negative for cough, shortness of breath and wheezing.   Cardiovascular:  Positive for palpitations (occ). Negative for chest pain and leg swelling.  Gastrointestinal:  Positive for diarrhea (occ - depending on what she eats). Negative for abdominal pain, blood in stool, constipation and nausea.       No gerd  Genitourinary:  Negative for dysuria.  Musculoskeletal:  Positive for arthralgias. Negative for back pain.  Skin:   Negative for rash.  Neurological:  Positive for light-headedness and headaches.  Psychiatric/Behavioral:  Positive for sleep disturbance. Negative for dysphoric mood. The patient is nervous/anxious.        Objective:   Vitals:   06/10/22 0829  BP: 130/82  Pulse: 80  Temp: 98 F (36.7 C)  SpO2: 99%   Filed Weights   06/10/22 0829  Weight: 195 lb (88.5 kg)   Body mass index is 34.54 kg/m.  BP Readings from Last 3 Encounters:  06/10/22 130/82  02/23/22 126/78  02/07/22 (!) 153/101    Wt Readings from Last 3 Encounters:  06/10/22 195 lb (88.5 kg)  02/23/22 193 lb (87.5 kg)  02/03/22 199 lb (90.3 kg)       Physical Exam Constitutional: She appears well-developed and well-nourished. No distress.  HENT:  Head: Normocephalic and atraumatic.  Right Ear: External ear normal. Normal ear canal and TM Left Ear: External ear normal.  Normal ear canal and TM Mouth/Throat: Oropharynx is clear and moist.  Eyes: Conjunctivae normal.  Neck: Neck supple. No tracheal deviation present. No thyromegaly present.  No carotid bruit  Cardiovascular: Normal rate, regular rhythm and normal heart sounds.   No murmur heard.  No edema. Pulmonary/Chest: Effort normal and breath sounds normal. No respiratory distress. She has no wheezes. She has no rales.  Breast: deferred   Abdominal: Soft. She exhibits no distension. There is no tenderness.  Lymphadenopathy: She has no cervical adenopathy.  Skin: Skin is warm and dry. She is not diaphoretic.  Psychiatric: She has a normal mood and affect. Her behavior is normal.     Lab Results  Component Value Date   WBC 6.0 06/08/2021   HGB 13.5 06/08/2021   HCT 39.9 06/08/2021   PLT 244.0 06/08/2021   GLUCOSE 131 (H) 12/07/2021   CHOL 172 12/07/2021   TRIG (H) 12/07/2021    539.0 Triglyceride is over 400; calculations on Lipids are invalid.   HDL 37.80 (L) 12/07/2021   LDLDIRECT 78.0 12/07/2021   LDLCALC 102 (H) 01/20/2020   ALT 60 (H)  12/07/2021   AST 41 (H) 12/07/2021   NA 139 12/07/2021   K 3.8 12/07/2021   CL 100 12/07/2021   CREATININE 0.81 12/07/2021   BUN 13 12/07/2021   CO2 29 12/07/2021   TSH 1.04 06/08/2021   HGBA1C 6.2 12/07/2021   MICROALBUR <0.7 12/07/2021         Assessment & Plan:   Physical exam: Screening blood work  ordered Exercise  not regular  Weight  not able to work on weight loss at this time Substance  abuse  none   Reviewed recommended immunizations.   Health Maintenance  Topic Date Due   MAMMOGRAM  11/14/2021   PAP SMEAR-Modifier  01/31/2022   FOOT EXAM  06/08/2022   HEMOGLOBIN A1C  06/09/2022   COVID-19 Vaccine (1) 06/26/2022 (Originally 11/15/1970)   INFLUENZA VACCINE  08/07/2022 (Originally 12/07/2021)   COLONOSCOPY (Pts 45-90yr Insurance coverage will need to be confirmed)  09/14/2022   Diabetic kidney evaluation - eGFR measurement  12/08/2022   Diabetic kidney evaluation - Urine ACR  12/08/2022   OPHTHALMOLOGY EXAM  02/22/2023   DTaP/Tdap/Td (4 - Td or Tdap) 11/25/2031   Hepatitis C Screening  Completed   HIV Screening  Completed   Zoster Vaccines- Shingrix  Completed   HPV VACCINES  Aged Out          See Problem List for Assessment and Plan of chronic medical problems.

## 2022-06-10 ENCOUNTER — Ambulatory Visit (INDEPENDENT_AMBULATORY_CARE_PROVIDER_SITE_OTHER): Payer: No Typology Code available for payment source | Admitting: Internal Medicine

## 2022-06-10 VITALS — BP 130/82 | HR 80 | Temp 98.0°F | Ht 63.0 in | Wt 195.0 lb

## 2022-06-10 DIAGNOSIS — G4709 Other insomnia: Secondary | ICD-10-CM | POA: Diagnosis not present

## 2022-06-10 DIAGNOSIS — F32A Depression, unspecified: Secondary | ICD-10-CM

## 2022-06-10 DIAGNOSIS — R7989 Other specified abnormal findings of blood chemistry: Secondary | ICD-10-CM

## 2022-06-10 DIAGNOSIS — E7849 Other hyperlipidemia: Secondary | ICD-10-CM

## 2022-06-10 DIAGNOSIS — Z0000 Encounter for general adult medical examination without abnormal findings: Secondary | ICD-10-CM | POA: Diagnosis not present

## 2022-06-10 DIAGNOSIS — G43809 Other migraine, not intractable, without status migrainosus: Secondary | ICD-10-CM | POA: Diagnosis not present

## 2022-06-10 DIAGNOSIS — F419 Anxiety disorder, unspecified: Secondary | ICD-10-CM

## 2022-06-10 DIAGNOSIS — E1165 Type 2 diabetes mellitus with hyperglycemia: Secondary | ICD-10-CM

## 2022-06-10 DIAGNOSIS — I10 Essential (primary) hypertension: Secondary | ICD-10-CM

## 2022-06-10 DIAGNOSIS — E669 Obesity, unspecified: Secondary | ICD-10-CM | POA: Diagnosis not present

## 2022-06-10 LAB — CBC WITH DIFFERENTIAL/PLATELET
Basophils Absolute: 0.1 10*3/uL (ref 0.0–0.1)
Basophils Relative: 0.7 % (ref 0.0–3.0)
Eosinophils Absolute: 0.2 10*3/uL (ref 0.0–0.7)
Eosinophils Relative: 3.3 % (ref 0.0–5.0)
HCT: 43.7 % (ref 36.0–46.0)
Hemoglobin: 15.5 g/dL — ABNORMAL HIGH (ref 12.0–15.0)
Lymphocytes Relative: 35.2 % (ref 12.0–46.0)
Lymphs Abs: 2.6 10*3/uL (ref 0.7–4.0)
MCHC: 35.5 g/dL (ref 30.0–36.0)
MCV: 91.1 fl (ref 78.0–100.0)
Monocytes Absolute: 0.6 10*3/uL (ref 0.1–1.0)
Monocytes Relative: 8.4 % (ref 3.0–12.0)
Neutro Abs: 3.8 10*3/uL (ref 1.4–7.7)
Neutrophils Relative %: 52.4 % (ref 43.0–77.0)
Platelets: 229 10*3/uL (ref 150.0–400.0)
RBC: 4.8 Mil/uL (ref 3.87–5.11)
RDW: 12.8 % (ref 11.5–15.5)
WBC: 7.3 10*3/uL (ref 4.0–10.5)

## 2022-06-10 LAB — LIPID PANEL
Cholesterol: 182 mg/dL (ref 0–200)
HDL: 34.7 mg/dL — ABNORMAL LOW (ref >39.00)
Total CHOL/HDL Ratio: 5
Triglycerides: 785 mg/dL — ABNORMAL HIGH (ref 0.0–149.0)

## 2022-06-10 LAB — COMPREHENSIVE METABOLIC PANEL
ALT: 192 U/L — ABNORMAL HIGH (ref 0–35)
AST: 144 U/L — ABNORMAL HIGH (ref 0–37)
Albumin: 4.4 g/dL (ref 3.5–5.2)
Alkaline Phosphatase: 95 U/L (ref 39–117)
BUN: 12 mg/dL (ref 6–23)
CO2: 27 mEq/L (ref 19–32)
Calcium: 10.5 mg/dL (ref 8.4–10.5)
Chloride: 99 mEq/L (ref 96–112)
Creatinine, Ser: 0.74 mg/dL (ref 0.40–1.20)
GFR: 90.99 mL/min (ref >60.00)
Glucose, Bld: 132 mg/dL — ABNORMAL HIGH (ref 70–99)
Potassium: 3.8 mEq/L (ref 3.5–5.1)
Sodium: 140 mEq/L (ref 135–145)
Total Bilirubin: 0.5 mg/dL (ref 0.2–1.2)
Total Protein: 7 g/dL (ref 6.0–8.3)

## 2022-06-10 LAB — HEMOGLOBIN A1C: Hgb A1c MFr Bld: 6.3 % (ref 4.6–6.5)

## 2022-06-10 LAB — LDL CHOLESTEROL, DIRECT: Direct LDL: 81 mg/dL

## 2022-06-10 LAB — TSH: TSH: 2.12 u[IU]/mL (ref 0.35–5.50)

## 2022-06-10 MED ORDER — ESZOPICLONE 2 MG PO TABS: 2.0000 mg | ORAL_TABLET | Freq: Every evening | ORAL | 0 refills | Status: DC | PRN

## 2022-06-10 NOTE — Assessment & Plan Note (Signed)
Chronic Blood pressure well controlled CMP Continue amlodipine 5 mg daily, labetalol 400 mg 3 times daily, triamterene-HCTZ 37.5-25 mg daily

## 2022-06-10 NOTE — Assessment & Plan Note (Signed)
Chronic Regular exercise and healthy diet encouraged Check lipid panel  Continue atorvastatin 40 mg daily, Vascepa 2 g twice daily

## 2022-06-10 NOTE — Assessment & Plan Note (Signed)
Chronic   Lab Results  Component Value Date   HGBA1C 6.2 12/07/2021   Sugars well controlled Check A1c Continue Rybelsus 7 mg daily, metformin 500 mg twice daily Stressed regular exercise, diabetic diet

## 2022-06-10 NOTE — Assessment & Plan Note (Addendum)
Chronic Not good to begin wth, but mom with dementia is up throughout the night which gets her up Trazodone, sonata not effective Trial of lunesta 2 mg  -- if not effective can try belsomra or dayvigo

## 2022-06-10 NOTE — Assessment & Plan Note (Addendum)
Chronic Controlled, Stable Continue Lexapro 10 mg daily, buspar 5 mg bid prn

## 2022-06-10 NOTE — Assessment & Plan Note (Signed)
Chronic Encouraged healthy diet Exercise when she can

## 2022-06-10 NOTE — Assessment & Plan Note (Signed)
Chronic Management per neurology-Dr. Jaynee Eagles

## 2022-06-12 NOTE — Addendum Note (Signed)
Addended by: Binnie Rail on: 06/12/2022 07:24 PM   Modules accepted: Orders

## 2022-06-13 ENCOUNTER — Encounter: Payer: Self-pay | Admitting: Internal Medicine

## 2022-06-28 ENCOUNTER — Other Ambulatory Visit: Payer: Self-pay

## 2022-06-28 ENCOUNTER — Other Ambulatory Visit: Payer: Self-pay | Admitting: Internal Medicine

## 2022-08-31 ENCOUNTER — Other Ambulatory Visit: Payer: Self-pay | Admitting: Internal Medicine

## 2022-08-31 DIAGNOSIS — J4521 Mild intermittent asthma with (acute) exacerbation: Secondary | ICD-10-CM

## 2022-10-01 ENCOUNTER — Other Ambulatory Visit: Payer: Self-pay | Admitting: Internal Medicine

## 2022-10-11 ENCOUNTER — Other Ambulatory Visit: Payer: Self-pay

## 2022-10-11 ENCOUNTER — Encounter: Payer: Self-pay | Admitting: Internal Medicine

## 2022-10-11 DIAGNOSIS — I10 Essential (primary) hypertension: Secondary | ICD-10-CM

## 2022-10-11 MED ORDER — METFORMIN HCL 500 MG PO TABS: 500.0000 mg | ORAL_TABLET | Freq: Two times a day (BID) | ORAL | 1 refills | Status: DC

## 2022-10-11 MED ORDER — RYBELSUS 7 MG PO TABS: 1.0000 | ORAL_TABLET | Freq: Every day | ORAL | 1 refills | Status: DC

## 2022-10-11 MED ORDER — AMLODIPINE BESYLATE 5 MG PO TABS
ORAL_TABLET | ORAL | 1 refills | Status: DC
Start: 2022-10-11 — End: 2023-04-17

## 2022-12-08 ENCOUNTER — Encounter: Payer: Self-pay | Admitting: Internal Medicine

## 2022-12-08 NOTE — Patient Instructions (Addendum)
      Blood work was ordered.   The lab is on the first floor.    Medications changes include :   None      Return in about 6 months (around 06/11/2023) for Physical Exam.

## 2022-12-08 NOTE — Progress Notes (Signed)
Subjective:    Patient ID: [name], female    DOB: 04-23-1966, 57 y.o.   MRN: [id number]     HPI Clema is here for follow up of her chronic medical problems.  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  Sleep is not great.  She does not have much time for her self-no time exercise, etc.  She feels her stress level overall is tolerable/controlled.  Medications and allergies reviewed with patient and updated if appropriate.  Current Outpatient Medications on File Prior to Visit  Medication Sig Dispense Refill   amLODipine (NORVASC) 5 MG tablet TAKE 1 TABLET BY MOUTH DAILY GENERIC EQUIVALENT FOR NORVASC 90 tablet 1   Azelastine HCl 137 MCG/SPRAY SOLN PLACE 1-2 SPRAYS INTO BOTH NOSTRILS 2 (TWO) TIMES DAILY. USE IN EACH NOSTRIL AS DIRECTED 30 mL 2   BESIVANCE 0.6 % SUSP Place 1 drop into the left eye 3 (three) times daily.     busPIRone (BUSPAR) 5 MG tablet TAKE 1 TABLET (5 MG TOTAL) BY MOUTH 2 (TWO) TIMES DAILY AS NEEDED (ANXIETY). 180 tablet 2   ciclopirox (LOPROX) 0.77 % cream APPLY TO AFFECTED AREA TWICE A DAY     clotrimazole-betamethasone (LOTRISONE) cream APPLY TO AFFECTED AREA TWICE A DAY 30 g 0   Diclofenac Sodium (PENNSAID) 2 % SOLN Pennsaid 20 mg/gram/actuation (2 %) topical soln in metered-dose pump  APPLY ONE PUMP TOPICALLY TO THE AFFECTED AREA TWICE DAILY AS NEEDED 112 g 3   doxycycline (MONODOX) 50 MG capsule Take 50 mg by mouth 2 (two) times daily.     eszopiclone (LUNESTA) 2 MG TABS tablet Take 1 tablet (2 mg total) by mouth at bedtime as needed for sleep. Take immediately before bedtime 30 tablet 0   Fremanezumab-vfrm (AJOVY) 225 MG/1.5ML SOAJ Inject 225 mg into the skin every 30 (thirty) days. 1.5 mL 11   glucose blood (ONETOUCH VERIO) test strip Use as instructed to check sugar daily 100 strip 3   glucose blood test strip OneTouch Verio test strips  TEST 4X DAILY     ipratropium-albuterol (DUONEB) 0.5-2.5 (3) MG/3ML SOLN Take 3 mLs by nebulization  every 6 (six) hours as needed. 360 mL 0   ketorolac (ACULAR) 0.5 % ophthalmic solution Place 1 drop into the left eye 4 (four) times daily.     labetalol (NORMODYNE) 200 MG tablet TAKE TWO TABLETS BY MOUTH THREE TIMES DAILY 540 tablet 1   Olopatadine HCl (PAZEO) 0.7 % SOLN Pazeo 0.7 % eye drops  INSTILL 1 DROP INTO BOTH EYES ONCE A DAY AS NEEDED     OneTouch Delica Lancets 33G MISC Apply topically 4 (four) times daily.     prednisoLONE acetate (PRED FORTE) 1 % ophthalmic suspension Place 1 drop into the left eye 4 (four) times daily.     RESTASIS 0.05 % ophthalmic emulsion INSTILL 1 DROP INTO BOTH EYES TWICE A DAY INSTILL 1 DROP INTO BOTH EYES TWICE DAILY     Rimegepant Sulfate (NURTEC) 75 MG TBDP Take 75 mg by mouth daily as needed (MIGRAINE). Max of 1 tablet per day. 10 tablet 10   Semaglutide (RYBELSUS) 7 MG TABS Take 1 tablet (7 mg total) by mouth daily. 90 tablet 1   triamcinolone cream (KENALOG) 0.1 % SMARTSIG:1 Application Topical 2-3 Times Daily     triamterene-hydrochlorothiazide (DYAZIDE) 37.5-25 MG capsule Take 1 each (1 capsule total) by mouth daily. 90 capsule 2   No current facility-administered medications on file prior to visit.  Review of Systems  Constitutional:  Negative for fever.  Respiratory:  Negative for cough, shortness of breath and wheezing.   Cardiovascular:  Positive for palpitations (occ) and leg swelling (occ when on feet long time). Negative for chest pain.  Neurological:  Positive for light-headedness (episodic) and headaches (increased recently - ? sinuses, stress).  Psychiatric/Behavioral:  Positive for sleep disturbance. The patient is nervous/anxious.        Objective:   Vitals:   12/09/22 0838  BP: 128/72  Pulse: 85  Temp: 98.7 F (37.1 C)  SpO2: 98%   BP Readings from Last 3 Encounters:  12/09/22 128/72  06/10/22 130/82  02/23/22 126/78   Wt Readings from Last 3 Encounters:  12/09/22 198 lb (89.8 kg)  06/10/22 195 lb (88.5 kg)   02/23/22 193 lb (87.5 kg)   Body mass index is 35.07 kg/m.    Physical Exam Constitutional:      General: She is not in acute distress.    Appearance: Normal appearance.  HENT:     Head: Normocephalic and atraumatic.  Eyes:     Conjunctiva/sclera: Conjunctivae normal.  Cardiovascular:     Rate and Rhythm: Normal rate and regular rhythm.     Heart sounds: Normal heart sounds.  Pulmonary:     Effort: Pulmonary effort is normal. No respiratory distress.     Breath sounds: Normal breath sounds. No wheezing.  Musculoskeletal:     Cervical back: Neck supple.     Right lower leg: No edema.     Left lower leg: No edema.  Lymphadenopathy:     Cervical: No cervical adenopathy.  Skin:    General: Skin is warm and dry.     Findings: No rash.  Neurological:     Mental Status: She is alert. Mental status is at baseline.  Psychiatric:        Mood and Affect: Mood normal.        Behavior: Behavior normal.        Lab Results  Component Value Date   WBC 7.3 06/10/2022   HGB 15.5 (H) 06/10/2022   HCT 43.7 06/10/2022   PLT 229.0 06/10/2022   GLUCOSE 132 (H) 06/10/2022   CHOL 182 06/10/2022   TRIG (H) 06/10/2022    785.0 Triglyceride is over 400; calculations on Lipids are invalid.   HDL 34.70 (L) 06/10/2022   LDLDIRECT 81.0 06/10/2022   LDLCALC 102 (H) 01/20/2020   ALT 192 (H) 06/10/2022   AST 144 (H) 06/10/2022   NA 140 06/10/2022   K 3.8 06/10/2022   CL 99 06/10/2022   CREATININE 0.74 06/10/2022   BUN 12 06/10/2022   CO2 27 06/10/2022   TSH 2.12 06/10/2022   HGBA1C 6.3 06/10/2022   MICROALBUR <0.7 12/07/2021     Assessment & Plan:    See Problem List for Assessment and Plan of chronic medical problems.

## 2022-12-09 ENCOUNTER — Ambulatory Visit: Payer: No Typology Code available for payment source | Admitting: Internal Medicine

## 2022-12-09 VITALS — BP 128/72 | HR 85 | Temp 98.7°F | Ht 63.0 in | Wt 198.0 lb

## 2022-12-09 DIAGNOSIS — Z7984 Long term (current) use of oral hypoglycemic drugs: Secondary | ICD-10-CM

## 2022-12-09 DIAGNOSIS — E669 Obesity, unspecified: Secondary | ICD-10-CM

## 2022-12-09 DIAGNOSIS — I10 Essential (primary) hypertension: Secondary | ICD-10-CM

## 2022-12-09 DIAGNOSIS — Z7985 Long-term (current) use of injectable non-insulin antidiabetic drugs: Secondary | ICD-10-CM

## 2022-12-09 DIAGNOSIS — G43809 Other migraine, not intractable, without status migrainosus: Secondary | ICD-10-CM

## 2022-12-09 DIAGNOSIS — E1165 Type 2 diabetes mellitus with hyperglycemia: Secondary | ICD-10-CM | POA: Diagnosis not present

## 2022-12-09 DIAGNOSIS — F419 Anxiety disorder, unspecified: Secondary | ICD-10-CM

## 2022-12-09 DIAGNOSIS — E7849 Other hyperlipidemia: Secondary | ICD-10-CM | POA: Diagnosis not present

## 2022-12-09 DIAGNOSIS — G4709 Other insomnia: Secondary | ICD-10-CM

## 2022-12-09 DIAGNOSIS — F32A Depression, unspecified: Secondary | ICD-10-CM

## 2022-12-09 LAB — COMPREHENSIVE METABOLIC PANEL
ALT: 62 U/L — ABNORMAL HIGH (ref 0–35)
AST: 45 U/L — ABNORMAL HIGH (ref 0–37)
Albumin: 4.7 g/dL (ref 3.5–5.2)
Alkaline Phosphatase: 70 U/L (ref 39–117)
BUN: 13 mg/dL (ref 6–23)
CO2: 29 mEq/L (ref 19–32)
Calcium: 10.2 mg/dL (ref 8.4–10.5)
Chloride: 101 mEq/L (ref 96–112)
Creatinine, Ser: 0.89 mg/dL (ref 0.40–1.20)
GFR: 72.66 mL/min (ref >60.00)
Glucose, Bld: 112 mg/dL — ABNORMAL HIGH (ref 70–99)
Potassium: 3.4 mEq/L — ABNORMAL LOW (ref 3.5–5.1)
Sodium: 140 mEq/L (ref 135–145)
Total Bilirubin: 0.9 mg/dL (ref 0.2–1.2)
Total Protein: 7.4 g/dL (ref 6.0–8.3)

## 2022-12-09 LAB — LIPID PANEL
Cholesterol: 145 mg/dL (ref 0–200)
HDL: 40.1 mg/dL (ref >39.00)
LDL Cholesterol: 66 mg/dL (ref 0–99)
NonHDL: 104.43
Total CHOL/HDL Ratio: 4
Triglycerides: 190 mg/dL — ABNORMAL HIGH (ref 0.0–149.0)
VLDL: 38 mg/dL (ref 0.0–40.0)

## 2022-12-09 LAB — CBC WITH DIFFERENTIAL/PLATELET
Basophils Absolute: 0.1 10*3/uL (ref 0.0–0.1)
Basophils Relative: 1 % (ref 0.0–3.0)
Eosinophils Absolute: 0.3 10*3/uL (ref 0.0–0.7)
Eosinophils Relative: 4.6 % (ref 0.0–5.0)
HCT: 44.2 % (ref 36.0–46.0)
Hemoglobin: 15 g/dL (ref 12.0–15.0)
Lymphocytes Relative: 37 % (ref 12.0–46.0)
Lymphs Abs: 2.3 10*3/uL (ref 0.7–4.0)
MCHC: 34 g/dL (ref 30.0–36.0)
MCV: 92 fl (ref 78.0–100.0)
Monocytes Absolute: 0.6 10*3/uL (ref 0.1–1.0)
Monocytes Relative: 10.2 % (ref 3.0–12.0)
Neutro Abs: 2.9 10*3/uL (ref 1.4–7.7)
Neutrophils Relative %: 47.2 % (ref 43.0–77.0)
Platelets: 253 10*3/uL (ref 150.0–400.0)
RBC: 4.8 Mil/uL (ref 3.87–5.11)
RDW: 13.2 % (ref 11.5–15.5)
WBC: 6.2 10*3/uL (ref 4.0–10.5)

## 2022-12-09 LAB — MICROALBUMIN / CREATININE URINE RATIO
Creatinine,U: 87.8 mg/dL
Microalb Creat Ratio: 0.8 mg/g (ref 0.0–30.0)
Microalb, Ur: <0.7 mg/dL (ref 0.0–1.9)

## 2022-12-09 LAB — HEMOGLOBIN A1C: Hgb A1c MFr Bld: 6 % (ref 4.6–6.5)

## 2022-12-09 MED ORDER — TIRZEPATIDE 2.5 MG/0.5ML ~~LOC~~ SOAJ: 2.5000 mg | SUBCUTANEOUS | 0 refills | Status: DC

## 2022-12-09 MED ORDER — ESCITALOPRAM OXALATE 10 MG PO TABS: 10.0000 mg | ORAL_TABLET | Freq: Every day | ORAL | 2 refills | Status: DC

## 2022-12-09 MED ORDER — ATORVASTATIN CALCIUM 40 MG PO TABS: 40.0000 mg | ORAL_TABLET | Freq: Every day | ORAL | 3 refills | Status: DC

## 2022-12-09 MED ORDER — ICOSAPENT ETHYL 1 G PO CAPS: 2.0000 g | ORAL_CAPSULE | Freq: Two times a day (BID) | ORAL | 5 refills | Status: DC

## 2022-12-09 MED ORDER — METFORMIN HCL 500 MG PO TABS: 500.0000 mg | ORAL_TABLET | Freq: Two times a day (BID) | ORAL | 1 refills | Status: DC

## 2022-12-09 NOTE — Assessment & Plan Note (Addendum)
Chronic   Lab Results  Component Value Date   HGBA1C 6.3 06/10/2022   Sugars well controlled Check A1c Continue metformin 500 mg twice daily Will try Mounjaro 2.5 mg weekly-when she starts that she will stop Rybelsus Stressed regular exercise, diabetic diet

## 2022-12-09 NOTE — Assessment & Plan Note (Addendum)
Chronic Encouraged healthy diet, decrease portions Exercise is much as possible Continue metformin Will try mounjaro - will stop rybelsus

## 2022-12-09 NOTE — Assessment & Plan Note (Signed)
Chronic Blood pressure well controlled CMP, CBC Continue amlodipine 5 mg daily, labetalol 400 mg 3 times daily, triamterene-HCTZ 37.5-25 mg daily

## 2022-12-09 NOTE — Assessment & Plan Note (Signed)
Chronic Controlled, Stable Continue Lexapro 10 mg daily, buspar 5 mg bid prn

## 2022-12-09 NOTE — Assessment & Plan Note (Signed)
Chronic Regular exercise and healthy diet encouraged Encouraged weight loss Check lipid panel, CMP Continue atorvastatin 40 mg daily, Vascepa 2 g twice daily

## 2022-12-09 NOTE — Assessment & Plan Note (Addendum)
Chronic Overall sleep is poor Trazodone, sonata, lunesta not effective  Related to stress which is high Stressed trying to get to bed earlier if possible to maximize sleep

## 2022-12-09 NOTE — Assessment & Plan Note (Signed)
Chronic Management per neurology-Dr. Lucia Gaskins On Ajovy injections, Nurtec as needed

## 2022-12-27 ENCOUNTER — Emergency Department (HOSPITAL_BASED_OUTPATIENT_CLINIC_OR_DEPARTMENT_OTHER): Payer: No Typology Code available for payment source

## 2022-12-27 ENCOUNTER — Other Ambulatory Visit: Payer: Self-pay

## 2022-12-27 ENCOUNTER — Emergency Department (HOSPITAL_BASED_OUTPATIENT_CLINIC_OR_DEPARTMENT_OTHER)
Admission: EM | Admit: 2022-12-27 | Discharge: 2022-12-27 | Disposition: A | Discharge status: Home / Self Care | Payer: No Typology Code available for payment source | Attending: Emergency Medicine | Admitting: Emergency Medicine

## 2022-12-27 ENCOUNTER — Encounter (HOSPITAL_BASED_OUTPATIENT_CLINIC_OR_DEPARTMENT_OTHER): Payer: Self-pay

## 2022-12-27 DIAGNOSIS — M25572 Pain in left ankle and joints of left foot: Secondary | ICD-10-CM | POA: Diagnosis present

## 2022-12-27 DIAGNOSIS — S8252XA Displaced fracture of medial malleolus of left tibia, initial encounter for closed fracture: Secondary | ICD-10-CM | POA: Diagnosis not present

## 2022-12-27 DIAGNOSIS — X501XXA Overexertion from prolonged static or awkward postures, initial encounter: Secondary | ICD-10-CM | POA: Diagnosis not present

## 2022-12-27 DIAGNOSIS — S82892A Other fracture of left lower leg, initial encounter for closed fracture: Secondary | ICD-10-CM | POA: Diagnosis not present

## 2022-12-27 DIAGNOSIS — E119 Type 2 diabetes mellitus without complications: Secondary | ICD-10-CM | POA: Diagnosis not present

## 2022-12-27 MED ORDER — MORPHINE SULFATE (PF) 4 MG/ML IV SOLN
4.0000 mg | Freq: Once | INTRAVENOUS | Status: AC
  Administered 2022-12-27: 4 mg via INTRAVENOUS
  Filled 2022-12-27: qty 1

## 2022-12-27 MED ORDER — PROPOFOL 10 MG/ML IV BOLUS
1.0000 mg/kg | Freq: Once | INTRAVENOUS | Status: AC
  Administered 2022-12-27: 89.8 mg via INTRAVENOUS
  Filled 2022-12-27: qty 20

## 2022-12-27 MED ORDER — LACTATED RINGERS IV BOLUS: 1000.0000 mL | Freq: Once | INTRAVENOUS | Status: DC

## 2022-12-27 MED ORDER — OXYCODONE-ACETAMINOPHEN 5-325 MG PO TABS: 1.0000 | ORAL_TABLET | Freq: Four times a day (QID) | ORAL | 0 refills | Status: DC | PRN

## 2022-12-27 MED ORDER — ONDANSETRON HCL 4 MG/2ML IJ SOLN
4.0000 mg | Freq: Once | INTRAMUSCULAR | Status: AC
  Administered 2022-12-27: 4 mg via INTRAVENOUS
  Filled 2022-12-27: qty 2

## 2022-12-27 MED ORDER — SODIUM CHLORIDE 0.9 % IV BOLUS
1000.0000 mL | Freq: Once | INTRAVENOUS | Status: AC
  Administered 2022-12-27: 1000 mL via INTRAVENOUS

## 2022-12-27 MED ORDER — OXYCODONE-ACETAMINOPHEN 5-325 MG PO TABS
2.0000 | ORAL_TABLET | Freq: Once | ORAL | Status: AC
  Administered 2022-12-27: 2 via ORAL
  Filled 2022-12-27: qty 2

## 2022-12-27 MED ORDER — KETAMINE HCL 10 MG/ML IJ SOLN
1.0000 mg/kg | Freq: Once | INTRAMUSCULAR | Status: AC
  Administered 2022-12-27: 90 mg via INTRAVENOUS
  Filled 2022-12-27: qty 1

## 2022-12-27 NOTE — ED Provider Notes (Signed)
.  Sedation  Date/Time: 12/27/2022 3:30 PM  Performed by: Laurence Spates, MD Authorized by: Laurence Spates, MD   Consent:    Consent obtained:  Verbal and written   Consent given by:  Patient   Risks discussed:  Allergic reaction, dysrhythmia, inadequate sedation, nausea, vomiting, respiratory compromise necessitating ventilatory assistance and intubation, prolonged sedation necessitating reversal and prolonged hypoxia resulting in organ damage   Alternatives discussed:  Analgesia without sedation Universal protocol:    Immediately prior to procedure, a time out was called: yes   Indications:    Procedure performed:  Fracture reduction Pre-sedation assessment:    Time since last food or drink:  12 hours   ASA classification: class 1 - normal, healthy patient     Mouth opening:  3 or more finger widths   Thyromental distance:  4 finger widths   Mallampati score:  II - soft palate, uvula, fauces visible   Neck mobility: normal     Pre-sedation assessments completed and reviewed: airway patency, cardiovascular function, hydration status, mental status, nausea/vomiting, pain level, respiratory function and temperature   Immediate pre-procedure details:    Reassessment: Patient reassessed immediately prior to procedure     Reviewed: vital signs, relevant labs/tests and NPO status     Verified: bag valve mask available, emergency equipment available, intubation equipment available, IV patency confirmed, oxygen available and suction available   Procedure details (see MAR for exact dosages):    Preoxygenation:  Nasal cannula   Sedation:  Propofol and ketamine   Intended level of sedation: deep   Intra-procedure monitoring:  Blood pressure monitoring, cardiac monitor, continuous pulse oximetry, continuous capnometry, frequent LOC assessments and frequent vital sign checks   Intra-procedure events: none     Total Provider sedation time (minutes):  20 Post-procedure details:    Attendance:  Constant attendance by certified staff until patient recovered     Recovery: Patient returned to pre-procedure baseline     Post-sedation assessments completed and reviewed: airway patency, cardiovascular function, hydration status, mental status, nausea/vomiting, pain level, respiratory function and temperature     Patient is stable for discharge or admission: yes     Procedure completion:  Tolerated well, no immediate complications .Ortho Injury Treatment  Date/Time: 12/27/2022 3:30 PM  Performed by: Laurence Spates, MD Authorized by: Laurence Spates, MD   Consent:    Consent obtained:  Verbal and written   Consent given by:  Patient   Risks discussed:  Fracture   Alternatives discussed:  Alternative treatment Universal protocol:    Immediately prior to procedure a time out was called: yes  Injury location: ankle Location details: left ankle Injury type: fracture-dislocation Pre-procedure neurovascular assessment: neurovascularly intact Pre-procedure distal perfusion: normal Pre-procedure neurological function: normal Pre-procedure range of motion: reduced Manipulation performed: yes Skin traction used: yes Reduction successful: yes X-ray confirmed reduction: yes Immobilization: splint Splint Applied by: ED Tech Supplies used: Ortho-Glass Post-procedure neurovascular assessment: post-procedure neurovascularly intact Post-procedure distal perfusion: normal Post-procedure neurological function: normal Post-procedure range of motion: improved       Laurence Spates, MD 12/30/22 1611

## 2022-12-27 NOTE — ED Triage Notes (Signed)
Pt to ED c/o left ankle injury. Pt tripped yesterday morning. Left ankle swollen, bruised. Pt unable to put weight on foot. Moving toes.

## 2022-12-27 NOTE — ED Provider Notes (Signed)
[place] EMERGENCY DEPARTMENT AT [facility] Provider Note   CSN: [id number] Arrival date & time: 12/27/22  1235     History Chief Complaint  Patient presents with   Leg Injury    Left ankle    [name] is a 57 y.o. female.  Patient with past history significant for morbid obesity, and diabetes presents to the emergency department concerns of ankle pain.  Reports that she was walking at home yesterday when she tripped over herself and rolled her left ankle.  Endorsing pain and bruising to the left ankle with inability to bear weight on this foot.  Able to move toes without difficulty and has full sensation.  Has tried taking ibuprofen at home without improvement in symptoms.  No prior history of any fractures or surgery in this ankle/leg.  HPI     Home Medications Prior to Admission medications   Medication Sig Start Date End Date Taking? Authorizing Provider  oxyCODONE-acetaminophen (PERCOCET/ROXICET) 5-325 MG tablet Take 1 tablet by mouth every 6 (six) hours as needed for severe pain. 12/27/22  Yes Maryanna Shape A, PA-C  amLODipine (NORVASC) 5 MG tablet TAKE 1 TABLET BY MOUTH DAILY GENERIC EQUIVALENT FOR NORVASC 10/11/22   Burns, Bobette Mo, MD  atorvastatin (LIPITOR) 40 MG tablet Take 1 tablet (40 mg total) by mouth daily. 12/09/22   Pincus Sanes, MD  Azelastine HCl 137 MCG/SPRAY SOLN PLACE 1-2 SPRAYS INTO BOTH NOSTRILS 2 (TWO) TIMES DAILY. USE IN EACH NOSTRIL AS DIRECTED Patient not taking: Reported on 12/28/2022 10/04/22   Pincus Sanes, MD  busPIRone (BUSPAR) 5 MG tablet TAKE 1 TABLET (5 MG TOTAL) BY MOUTH 2 (TWO) TIMES DAILY AS NEEDED (ANXIETY). 08/31/22   Pincus Sanes, MD  escitalopram (LEXAPRO) 10 MG tablet Take 1 tablet (10 mg total) by mouth daily. 12/09/22   Pincus Sanes, MD  eszopiclone (LUNESTA) 2 MG TABS tablet Take 1 tablet (2 mg total) by mouth at bedtime as needed for sleep. Take immediately before bedtime Patient not taking: Reported on 12/28/2022  06/10/22   Pincus Sanes, MD  fluticasone [facility]) 50 MCG/ACT nasal spray Place 2 sprays into both nostrils 2 (two) times daily as needed for allergies.    [provider]  glucose blood (ONETOUCH VERIO) test strip Use as instructed to check sugar daily 01/20/20   Pincus Sanes, MD  glucose blood test strip OneTouch Verio test strips  TEST 4X DAILY    [provider]  icosapent Ethyl (VASCEPA) 1 g capsule Take 2 capsules (2 g total) by mouth 2 (two) times daily. 12/09/22   Burns, Bobette Mo, MD  ipratropium-albuterol (DUONEB) 0.5-2.5 (3) MG/3ML SOLN Take 3 mLs by nebulization every 6 (six) hours as needed. 02/12/22   Etta Grandchild, MD  labetalol (NORMODYNE) 200 MG tablet TAKE TWO TABLETS BY MOUTH THREE TIMES DAILY 12/07/21   Pincus Sanes, MD  metFORMIN (GLUCOPHAGE) 500 MG tablet Take 1 tablet (500 mg total) by mouth 2 (two) times daily with a meal. 12/09/22   Burns, Bobette Mo, MD  RESTASIS 0.05 % ophthalmic emulsion INSTILL 1 DROP INTO BOTH EYES TWICE A DAY INSTILL 1 DROP INTO BOTH EYES TWICE DAILY 01/03/19   [provider]  Semaglutide (RYBELSUS) 7 MG TABS Take 1 tablet (7 mg total) by mouth daily. Patient not taking: Reported on 12/28/2022 10/11/22   Pincus Sanes, MD  tirzepatide [facility]) 2.5 MG/0.5ML Pen Inject 2.5 mg into the skin once a week. 12/09/22  Pincus Sanes, MD  triamterene-hydrochlorothiazide (DYAZIDE) 37.5-25 MG capsule Take 1 each (1 capsule total) by mouth daily. 12/07/21   Pincus Sanes, MD      Allergies    Lisinopril and Olmesartan medoxomil    Review of Systems   Review of Systems  Musculoskeletal:  Positive for joint swelling.  All other systems reviewed and are negative.   Physical Exam Updated Vital Signs BP 124/82   Pulse 83   Temp 98.2 F (36.8 C) (Oral)   Resp 18   Ht 5\' 4"  (1.626 m)   Wt 89.8 kg   SpO2 98%   BMI 33.99 kg/m  Physical Exam Vitals and nursing note reviewed.  Constitutional:      General: She is not in acute  distress.    Appearance: She is well-developed.  HENT:     Head: Normocephalic and atraumatic.  Eyes:     Conjunctiva/sclera: Conjunctivae normal.  Cardiovascular:     Rate and Rhythm: Normal rate and regular rhythm.     Heart sounds: No murmur heard. Pulmonary:     Effort: Pulmonary effort is normal. No respiratory distress.     Breath sounds: Normal breath sounds.  Abdominal:     Palpations: Abdomen is soft.     Tenderness: There is no abdominal tenderness.  Musculoskeletal:        General: Swelling, tenderness and signs of injury present. No deformity.     Cervical back: Neck supple.       Legs:     Comments: Bruising noted to the medial aspect of the left ankle.  Slight deformity to the lateral aspect of the left ankle but this may just be due to swelling.  Limited range of motion due to pain.  Full motor function and sensory function intact in the left toes distal to the injury site.  Skin:    General: Skin is warm and dry.     Capillary Refill: Capillary refill takes less than 2 seconds.  Neurological:     Mental Status: She is alert.  Psychiatric:        Mood and Affect: Mood normal.     ED Results / Procedures / Treatments   Labs (all labs ordered are listed, but only abnormal results are displayed) Labs Reviewed - No data to display  EKG None  Radiology DG Ankle Left Port  Result Date: 12/27/2022 CLINICAL DATA:  Injury, postreduction. EXAM: PORTABLE LEFT ANKLE - 2 VIEW COMPARISON:  Radiograph earlier today FINDINGS: Improved alignment of distal fibular fracture postreduction, mild residual lateral displacement of distal fracture fragment. Improved alignment of medial malleolar fracture with mild residual lateral displacement. Decreased lateral subluxation of the talus with respect to the tibial plafond, mild residual displacement. IMPRESSION: Improved alignment of distal fibular and medial malleolar fractures postreduction. Decreased lateral subluxation of the talus  with respect to the tibial plafond. Electronically Signed   By: Narda Rutherford M.D.   On: 12/27/2022 18:05   DG Ankle Left Port  Result Date: 12/27/2022 CLINICAL DATA:  Left ankle pain after injury yesterday. EXAM: PORTABLE LEFT ANKLE - 2 VIEW COMPARISON:  None Available. FINDINGS: Severely displaced medial malleolar fracture is noted as well as moderately displaced distal fibular fracture. Moderate lateral dislocation of talus relative to distal tibia is noted. IMPRESSION: Moderate lateral talotibial dislocation is noted with associated severely displaced medial malleolar fracture and moderately displaced distal left fibular fracture. Electronically Signed   By: Lupita Raider M.D.   On: 12/27/2022  15:42    Procedures Procedures   Medications Ordered in ED Medications  oxyCODONE-acetaminophen (PERCOCET/ROXICET) 5-325 MG per tablet 2 tablet (2 tablets Oral Given 12/27/22 1429)  morphine (PF) 4 MG/ML injection 4 mg (4 mg Intravenous Given 12/27/22 1612)  ondansetron (ZOFRAN) injection 4 mg (4 mg Intravenous Given 12/27/22 1713)  propofol (DIPRIVAN) 10 mg/mL bolus/IV push 89.8 mg (89.8 mg Intravenous Given 12/27/22 1731)  ketamine (KETALAR) injection 90 mg (90 mg Intravenous Given 12/27/22 1731)  sodium chloride 0.9 % bolus 1,000 mL (0 mLs Intravenous Stopped 12/27/22 1847)  morphine (PF) 4 MG/ML injection 4 mg (4 mg Intravenous Given 12/27/22 1811)    ED Course/ Medical Decision Making/ A&P                               Medical Decision Making Amount and/or Complexity of Data Reviewed Radiology: ordered.  Risk Prescription drug management.   This patient presents to the ED for concern of left ankle injury. Differential diagnosis includes fibula fracture, tibial type of fracture, ankle dislocation    Imaging Studies ordered:  I ordered imaging studies including x-ray of left ankle, post-reduction left ankle xray I independently visualized and interpreted imaging which showed moderate  lateral talotibial dislocation is noted with associated severely displaced medial malleolar fracture and moderately displaced distal left fibular fracture. Post reduction film with improvement in improved alignment, particularly with medial malleolus.  I agree with the radiologist interpretation   Medicines ordered and prescription drug management:  I ordered medication including Percocet for pain Reevaluation of the patient after these medicines showed that the patient improved I have reviewed the patients home medicines and have made adjustments as needed   Problem List / ED Course:  Patient presents to the ED with concerns of left ankle injury. Reports that she was walking at home when she tripped over herself and noted pain in the left ankle. Unable to bear weight and reports swelling and bruising to this area. No prior surgeries or significant injuries in this area. Neurovascularly intact. Xray imaging ordered for assessment of injury. Motor and sensory function intact in left foot/ankle. Mild ecchymosis to the left medial aspect of ankle with slight deformity, but this may just be due to ankle swelling from injury. States pain is 8 out of 10. Will order Percocet as patient does not have IV in at this moment. Will await result of radiologist interpretation of ankle xray. Spoke with Dr. August Saucer, orthopedic surgeon, regarding patient case. Advised attempting manual reduction with sedation for better alignment but will plan on following up with patient tomorrow morning in office regardless. Planning on surgical management. Reduction performed by Dr. Earlene Plater, ED attending physician, after sedation. Patient tolerated well and post-reduction films appear to show improvement of positioning of the medial malleolar fracture/dislocation. Will update Dr. August Saucer on patient status for any further input. Dr August Saucer feels that reduction was successful and advises outpatient follow up with his clinic tomorrow morning at  8:15am. Managing on operative management later this week. Will discharge patient home with pain medicine for continued pain management.  Final Clinical Impression(s) / ED Diagnoses Final diagnoses:  Closed fracture of left ankle, initial encounter    Rx / DC Orders ED Discharge Orders          Ordered    oxyCODONE-acetaminophen (PERCOCET/ROXICET) 5-325 MG tablet  Every 6 hours PRN        12/27/22 1828  Smitty Knudsen, PA-C 12/28/22 1544    Elayne Snare K, DO 12/30/22 [phone]

## 2022-12-27 NOTE — Discharge Instructions (Addendum)
You were seen in the ER today for an ankle injury. Your imaging unfortunately showed a fracture in this area of your distal fibula as well as the medial malleolus. I spoke with Dr. August Saucer regarding your case and manuel reduction as performed with some improvement in alignment. Dr. August Saucer is expecting you in clinic tomorrow morning at 8:15am for evaluation fo this and is planning on likely surgery to repair this. Please ensure you follow up with Dr. August Saucer.  [facility] [address] [place], [place] [address] They are planning to see you tomorrow morning at 8:15am.

## 2022-12-27 NOTE — ED Notes (Signed)
Unable to rate pain during procedure due to sedation

## 2022-12-27 NOTE — ED Notes (Signed)
Reviewed AVS/discharge instruction with patient. Time allotted for and all questions answered. Patient is agreeable for d/c and escorted to ed exit by staff.  

## 2022-12-27 NOTE — Progress Notes (Signed)
Pt on ra before conscious sedation SATS 96%, HR 83, RR 13 CO@ 42 and BP 124/87. Pt's O2 dropped during procedure and RT placed the pt on 2l SAT 93. Sats dropped to 89 and RT increased the O2 to 4L and then to 6L. Pt O2 SAT went up to 99% and RT gradually lowered the Pt O2 to 2l. The CO2 36 and O2 99, RR 15. Pt is stable

## 2022-12-28 ENCOUNTER — Ambulatory Visit (INDEPENDENT_AMBULATORY_CARE_PROVIDER_SITE_OTHER): Payer: No Typology Code available for payment source | Admitting: Orthopedic Surgery

## 2022-12-28 ENCOUNTER — Telehealth: Payer: Self-pay

## 2022-12-28 ENCOUNTER — Other Ambulatory Visit: Payer: Self-pay

## 2022-12-28 ENCOUNTER — Encounter (HOSPITAL_COMMUNITY): Payer: Self-pay | Admitting: Orthopedic Surgery

## 2022-12-28 DIAGNOSIS — S82892A Other fracture of left lower leg, initial encounter for closed fracture: Secondary | ICD-10-CM

## 2022-12-28 NOTE — Progress Notes (Signed)
For Anesthesia: PCP - Pincus Sanes, MD  Cardiologist - N/A  Chest x-ray - N/A EKG - N/A Stress Test - N/A ECHO - N/A Cardiac Cath - N/A Pacemaker/ICD device last checked: N/A Pacemaker orders received: N/A Device Rep notified: N/A  Spinal Cord Stimulator:  N/A  Sleep Study - N/A CPAP - N/A  Fasting Blood Sugar - N/A Checks Blood Sugar __as needed___ times a day Date and result of last Hgb A1c- 12/09/22 (6) in [facility]  Last dose of GLP1 agonist- Mounjaro GLP1 instructions: 12/26/22  Last dose of SGLT-2 inhibitors- N/A SGLT-2 instructions:N/A  Blood Thinner Instructions: N/A Aspirin Instructions: N/A Last Dose: N/A  Activity level:  Able to exercise without chest pain and/or shortness of breath     Anesthesia review: N/A  Patient denies shortness of breath, fever, cough and chest pain during pre op phone call.   Patient verbalized understanding of instructions reviewed via telephone.

## 2022-12-28 NOTE — Progress Notes (Signed)
Choose an anesthesia record to view details        DISCUSSION: [name] is a 57 yo female who presents to PAT prior to L ankle ORIF on 12/29/22 with Dr. August Saucer. PMH significant for HTN, HLD, diabetes, obesity, insomnia, migraines, anxiety, depression  No prior anesthesia complications  Patient fell and broke her ankle on 8/20. She presented to the ED and she was sedated and it was reduced. She required 6L O2 during the procedure but was weaned to RA afterwards and discharged.  Patient follows with her PCP for chronic medical problems. Last seen on 8/2. She was started on [facility] - last dose was 8/19. A1c was 6.0. Blood pressure noted to be well controlled. Advised to f/u in 6 months.  VS: Ht 5\' 4"  (1.626 m)   Wt 89.8 kg   BMI 33.99 kg/m   PROVIDERS: Pincus Sanes, MD   LABS: Labs reviewed: Acceptable for surgery. (all labs ordered are listed, but only abnormal results are displayed)  Labs Reviewed - No data to display   IMAGES:  L ankle 12/27/22:  IMPRESSION: Improved alignment of distal fibular and medial malleolar fractures postreduction. Decreased lateral subluxation of the talus with respect to the tibial plafond.   EKG: obtain DOS   CV:  Past Medical History:  Diagnosis Date   ALLERGIC RHINITIS    Anal skin tag 09/13/2017   Anemia    History of   Cataract    Bilateral   COMMON MIGRAINE    GLUCOSE INTOLERANCE    Hx of colonic polyp 09/20/2017   Hypercalcemia    HYPERLIPIDEMIA    HYPERTENSION    INSOMNIA    Migraines    OA (osteoarthritis)    OBESITY    RENAL INSUFFICIENCY     Past Surgical History:  Procedure Laterality Date   BREAST SURGERY  05/09/1996   Implants   cataract Bilateral    CESAREAN SECTION     hypertension post partum  09/07/2006   Not precalmpsia    MEDICATIONS: No current facility-administered medications for this encounter.    amLODipine (NORVASC) 5 MG tablet   atorvastatin (LIPITOR) 40 MG tablet   busPIRone  (BUSPAR) 5 MG tablet   escitalopram (LEXAPRO) 10 MG tablet   fluticasone (FLONASE) 50 MCG/ACT nasal spray   icosapent Ethyl (VASCEPA) 1 g capsule   ipratropium-albuterol (DUONEB) 0.5-2.5 (3) MG/3ML SOLN   labetalol (NORMODYNE) 200 MG tablet   metFORMIN (GLUCOPHAGE) 500 MG tablet   oxyCODONE-acetaminophen (PERCOCET/ROXICET) 5-325 MG tablet   RESTASIS 0.05 % ophthalmic emulsion   tirzepatide (MOUNJARO) 2.5 MG/0.5ML Pen   triamterene-hydrochlorothiazide (DYAZIDE) 37.5-25 MG capsule   Azelastine HCl 137 MCG/SPRAY SOLN   eszopiclone (LUNESTA) 2 MG TABS tablet   glucose blood (ONETOUCH VERIO) test strip   glucose blood test strip   Semaglutide (RYBELSUS) 7 MG TABS   Ubaldo Glassing, PA-C [facility] Surgical Short Stay/Anesthesiology [facility] Phone [phone] 12/28/2022 3:11 PM

## 2022-12-28 NOTE — Transitions of Care (Post Inpatient/ED Visit) (Signed)
   12/28/2022  Name: [name] MRN: [id number] DOB: Jul 07, 1965  Today's TOC FU Call Status: Today's TOC FU Call Status:: Unsuccessful Call (1st Attempt) Unsuccessful Call (1st Attempt) Date: 12/28/22  Attempted to reach the patient regarding the most recent Inpatient/ED visit.  Follow Up Plan: Additional outreach attempts will be made to reach the patient to complete the Transitions of Care (Post Inpatient/ED visit) call.   Signature Karena Addison, LPN [facility] Nurse Health Advisor Direct Dial [phone]

## 2022-12-28 NOTE — Transitions of Care (Post Inpatient/ED Visit) (Signed)
12/28/2022  Name: [name] MRN: [id number] DOB: 1966-05-01  Today's TOC FU Call Status: Today's TOC FU Call Status:: Successful TOC FU Call Completed Unsuccessful Call (1st Attempt) Date: 12/28/22 [facility] FU Call Complete Date: 12/28/22  Transition Care Management Follow-up Telephone Call Date of Discharge: 12/27/22 Discharge Facility: [facility] ([facility]) Type of Discharge: Emergency Department Reason for ED Visit: Other: (fracture left leg) How have you been since you were released from the hospital?: Better Any questions or concerns?: No  Items Reviewed: Did you receive and understand the discharge instructions provided?: Yes Medications obtained,verified, and reconciled?: Yes (Medications Reviewed) Any new allergies since your discharge?: No Dietary orders reviewed?: Yes Do you have support at home?: Yes People in Home: spouse  Medications Reviewed Today: Medications Reviewed Today     Reviewed by Karena Addison, LPN (Licensed Practical Nurse) on 12/28/22 at 1154  Med List Status: <None>   Medication Order Taking? Sig Documenting Provider Last Dose Status Informant  amLODipine (NORVASC) 5 MG tablet [id number] No TAKE 1 TABLET BY MOUTH DAILY GENERIC EQUIVALENT FOR NORVASC Burns, Bobette Mo, MD Taking Active Self  atorvastatin (LIPITOR) 40 MG tablet [id number]  Take 1 tablet (40 mg total) by mouth daily. Pincus Sanes, MD  Active Self  Azelastine HCl 137 MCG/SPRAY SOLN [id number] No PLACE 1-2 SPRAYS INTO BOTH NOSTRILS 2 (TWO) TIMES DAILY. USE IN EACH NOSTRIL AS DIRECTED  Patient not taking: Reported on 12/28/2022   Pincus Sanes, MD Not Taking Active Self  busPIRone (BUSPAR) 5 MG tablet [id number] No TAKE 1 TABLET (5 MG TOTAL) BY MOUTH 2 (TWO) TIMES DAILY AS NEEDED (ANXIETY). Pincus Sanes, MD Taking Active Self  escitalopram (LEXAPRO) 10 MG tablet [id number]  Take 1 tablet (10 mg total) by mouth daily. Pincus Sanes, MD  Active Self  eszopiclone Alfonso Patten) 2 MG TABS  tablet [id number] No Take 1 tablet (2 mg total) by mouth at bedtime as needed for sleep. Take immediately before bedtime  Patient not taking: Reported on 12/28/2022   Pincus Sanes, MD Not Taking Active Self  fluticasone [facility]) 50 MCG/ACT nasal spray [id number]  Place 2 sprays into both nostrils 2 (two) times daily as needed for allergies. [provider]  Active Self  glucose blood (ONETOUCH VERIO) test strip [id number] No Use as instructed to check sugar daily Pincus Sanes, MD Taking Active Self  glucose blood test strip [id number] No OneTouch Verio test strips  TEST 4X DAILY [provider] Taking Active Self  icosapent Ethyl (VASCEPA) 1 g capsule [id number]  Take 2 capsules (2 g total) by mouth 2 (two) times daily. Pincus Sanes, MD  Active Self  ipratropium-albuterol (DUONEB) 0.5-2.5 (3) MG/3ML SOLN [id number] No Take 3 mLs by nebulization every 6 (six) hours as needed. Etta Grandchild, MD Taking Active Self  labetalol (NORMODYNE) 200 MG tablet [id number] No TAKE TWO TABLETS BY MOUTH THREE TIMES DAILY Burns, Bobette Mo, MD Taking Active Self  metFORMIN (GLUCOPHAGE) 500 MG tablet [id number]  Take 1 tablet (500 mg total) by mouth 2 (two) times daily with a meal. Burns, Bobette Mo, MD  Active Self  oxyCODONE-acetaminophen (PERCOCET/ROXICET) 5-325 MG tablet [id number]  Take 1 tablet by mouth every 6 (six) hours as needed for severe pain. Smitty Knudsen, PA-C  Active Self  RESTASIS 0.05 % ophthalmic emulsion [id number] No INSTILL 1 DROP INTO BOTH EYES TWICE A DAY INSTILL 1 DROP INTO BOTH EYES TWICE DAILY [provider] Taking Active Self  Semaglutide (RYBELSUS) 7  MG TABS [id number] No Take 1 tablet (7 mg total) by mouth daily.  Patient not taking: Reported on 12/28/2022   Pincus Sanes, MD Not Taking Active Self  tirzepatide [facility]) 2.5 MG/0.5ML Pen [id number] No Inject 2.5 mg into the skin once a week. Pincus Sanes, MD 12/26/2022 Active Self  triamterene-hydrochlorothiazide  (DYAZIDE) 37.5-25 MG capsule [id number] No Take 1 each (1 capsule total) by mouth daily. Pincus Sanes, MD Taking Active Self            Home Care and Equipment/Supplies: Were Home Health Services Ordered?: NA Any new equipment or medical supplies ordered?: NA  Functional Questionnaire: Do you need assistance with bathing/showering or dressing?: Yes Do you need assistance with meal preparation?: Yes Do you need assistance with eating?: No Do you have difficulty maintaining continence: No Do you need assistance with getting out of bed/getting out of a chair/moving?: Yes Do you have difficulty managing or taking your medications?: No  Follow up appointments reviewed: PCP Follow-up appointment confirmed?: NA Specialist Hospital Follow-up appointment confirmed?: Yes Date of Specialist follow-up appointment?: 12/29/22 Follow-Up Specialty Provider:: ortho Do you need transportation to your follow-up appointment?: No Do you understand care options if your condition(s) worsen?: Yes-patient verbalized understanding    SIGNATURE Karena Addison, LPN [facility] Nurse Health Advisor Direct Dial [phone]

## 2022-12-28 NOTE — Patient Instructions (Addendum)
SURGICAL WAITING ROOM VISITATION  Patients having surgery or a procedure may have no more than 2 support people in the waiting area - these visitors may rotate.    Children under the age of 69 must have an adult with them who is not the patient.  Due to an increase in RSV and influenza rates and associated hospitalizations, children ages 70 and under may not visit patients in [facility] hospitals.  If the patient needs to stay at the hospital during part of their recovery, the visitor guidelines for inpatient rooms apply. Pre-op nurse will coordinate an appropriate time for 1 support person to accompany patient in pre-op.  This support person may not rotate.    Please refer to the [facility] website for the visitor guidelines for Inpatients (after your surgery is over and you are in a regular room).       Your procedure is scheduled on: Thursday, Dec 29, 2022   Report to [facility] Main Entrance    Report to admitting at 12:45 PM   Call this number if you have problems the morning of surgery [phone]   Do not eat food :After Midnight.   After Midnight you may have the following liquids until 12 NOON DAY OF SURGERY  Water Non-Citrus Juices (without pulp, NO RED-Apple, White grape, White cranberry) Black Coffee (NO MILK/CREAM OR CREAMERS, sugar ok)  Clear Tea (NO MILK/CREAM OR CREAMERS, sugar ok) regular and decaf                             Plain Jell-O (NO RED)                                           Fruit ices (not with fruit pulp, NO RED)                                     Popsicles (NO RED)                                                               Sports drinks like Gatorade (NO RED)  FOLLOW BOWEL PREP AND ANY ADDITIONAL PRE OP INSTRUCTIONS YOU RECEIVED FROM YOUR SURGEON'S OFFICE!!!     Oral Hygiene is also important to reduce your risk of infection.                                    Remember - BRUSH YOUR TEETH THE MORNING OF SURGERY WITH YOUR REGULAR  TOOTHPASTE  DENTURES WILL BE REMOVED PRIOR TO SURGERY PLEASE DO NOT APPLY "Poly grip" OR ADHESIVES!!!   Do NOT smoke after Midnight   Stop all vitamins and herbal supplements 7 days before surgery.   Take these medicines the morning of surgery with A SIP OF WATER:  Amlodipine            Escitalopram            Vascepa  Labetalol   May use eyedrops morning of surgery  DO NOT TAKE ANY ORAL DIABETIC MEDICATIONS DAY OF YOUR SURGERY                              You may not have any metal on your body including hair pins, jewelry, and body piercing             Do not wear make-up, lotions, powders, perfumes/cologne, or deodorant  Do not wear nail polish including gel and S&S, artificial/acrylic nails, or any other type of covering on natural nails including finger and toenails. If you have artificial nails, gel coating, etc. that needs to be removed by a nail salon please have this removed prior to surgery or surgery may need to be canceled/ delayed if the surgeon/ anesthesia feels like they are unable to be safely monitored.   Do not shave  48 hours prior to surgery.               Do not bring valuables to the hospital. [facility] IS NOT             RESPONSIBLE   FOR VALUABLES.   Contacts, glasses, dentures or bridgework may not be worn into surgery.   Bring small overnight bag day of surgery.   DO NOT BRING YOUR HOME MEDICATIONS TO THE HOSPITAL. PHARMACY WILL DISPENSE MEDICATIONS LISTED ON YOUR MEDICATION LIST TO YOU DURING YOUR ADMISSION IN THE HOSPITAL!    Patients discharged on the day of surgery will not be allowed to drive home.  Someone NEEDS to stay with you for the first 24 hours after anesthesia.   Special Instructions: Bring a copy of your healthcare power of attorney and living will documents the day of surgery if you haven't scanned them before.              Please read over the following fact sheets you were given: IF YOU HAVE QUESTIONS ABOUT YOUR PRE-OP  INSTRUCTIONS PLEASE CALL [phone]   If you received a COVID test during your pre-op visit  it is requested that you wear a mask when out in public, stay away from anyone that may not be feeling well and notify your surgeon if you develop symptoms. If you test positive for Covid or have been in contact with anyone that has tested positive in the last 10 days please notify you surgeon.

## 2022-12-28 NOTE — Anesthesia Preprocedure Evaluation (Signed)
Anesthesia Evaluation  Patient identified by MRN, date of birth, ID band Patient awake    Reviewed: Allergy & Precautions, NPO status , Patient's Chart, lab work & pertinent test results  Airway Mallampati: III  TM Distance: >3 FB Neck ROM: Full    Dental no notable dental hx.    Pulmonary former smoker Vapes    Pulmonary exam normal        Cardiovascular hypertension, Pt. on medications and Pt. on home beta blockers Normal cardiovascular exam     Neuro/Psych  Headaches PSYCHIATRIC DISORDERS Anxiety Depression       GI/Hepatic negative GI ROS, Neg liver ROS,,,  Endo/Other  diabetes, Oral Hypoglycemic Agents  Patient on GLP-1 Agonist  Renal/GU Renal disease     Musculoskeletal  (+) Arthritis ,    Abdominal  (+) + obese  Peds  Hematology negative hematology ROS (+)   Anesthesia Other Findings left ankle bimalleolar fracture  Reproductive/Obstetrics                              Anesthesia Physical Anesthesia Plan  ASA: 3  Anesthesia Plan: General and Regional   Post-op Pain Management:    Induction: Intravenous  PONV Risk Score and Plan: 3 and Ondansetron, Dexamethasone, Midazolam and Treatment may vary due to age or medical condition  Airway Management Planned: Oral ETT  Additional Equipment:   Intra-op Plan:   Post-operative Plan: Extubation in OR  Informed Consent: I have reviewed the patients History and Physical, chart, labs and discussed the procedure including the risks, benefits and alternatives for the proposed anesthesia with the patient or authorized representative who has indicated his/her understanding and acceptance.     Dental advisory given  Plan Discussed with: CRNA  Anesthesia Plan Comments: (PAT note from 8/21 by Sherlie Ban PA-C )         Anesthesia Quick Evaluation

## 2022-12-28 NOTE — Progress Notes (Signed)
Sent message, via epic in basket, requesting orders in epic from surgeon.  

## 2022-12-29 ENCOUNTER — Ambulatory Visit (HOSPITAL_BASED_OUTPATIENT_CLINIC_OR_DEPARTMENT_OTHER): Payer: No Typology Code available for payment source | Admitting: Anesthesiology

## 2022-12-29 ENCOUNTER — Encounter: Payer: Self-pay | Admitting: Orthopedic Surgery

## 2022-12-29 ENCOUNTER — Ambulatory Visit (HOSPITAL_COMMUNITY): Payer: No Typology Code available for payment source

## 2022-12-29 ENCOUNTER — Encounter (HOSPITAL_COMMUNITY)
Admission: RE | Disposition: A | Discharge status: Home / Self Care | Payer: Self-pay | Source: Home / Self Care | Attending: Orthopedic Surgery

## 2022-12-29 ENCOUNTER — Ambulatory Visit (HOSPITAL_COMMUNITY)
Admission: RE | Admit: 2022-12-29 | Discharge: 2022-12-29 | Disposition: A | Discharge status: Home / Self Care | Payer: No Typology Code available for payment source | Attending: Orthopedic Surgery | Admitting: Orthopedic Surgery

## 2022-12-29 ENCOUNTER — Ambulatory Visit (HOSPITAL_COMMUNITY): Payer: No Typology Code available for payment source | Admitting: Anesthesiology

## 2022-12-29 ENCOUNTER — Encounter (HOSPITAL_COMMUNITY): Payer: Self-pay | Admitting: Orthopedic Surgery

## 2022-12-29 ENCOUNTER — Other Ambulatory Visit: Payer: Self-pay

## 2022-12-29 DIAGNOSIS — E119 Type 2 diabetes mellitus without complications: Secondary | ICD-10-CM | POA: Insufficient documentation

## 2022-12-29 DIAGNOSIS — I10 Essential (primary) hypertension: Secondary | ICD-10-CM | POA: Insufficient documentation

## 2022-12-29 DIAGNOSIS — Z7984 Long term (current) use of oral hypoglycemic drugs: Secondary | ICD-10-CM | POA: Insufficient documentation

## 2022-12-29 DIAGNOSIS — X58XXXA Exposure to other specified factors, initial encounter: Secondary | ICD-10-CM | POA: Insufficient documentation

## 2022-12-29 DIAGNOSIS — Z87891 Personal history of nicotine dependence: Secondary | ICD-10-CM

## 2022-12-29 DIAGNOSIS — S82842A Displaced bimalleolar fracture of left lower leg, initial encounter for closed fracture: Secondary | ICD-10-CM

## 2022-12-29 DIAGNOSIS — Z79899 Other long term (current) drug therapy: Secondary | ICD-10-CM | POA: Insufficient documentation

## 2022-12-29 DIAGNOSIS — Z01818 Encounter for other preprocedural examination: Secondary | ICD-10-CM

## 2022-12-29 DIAGNOSIS — E1165 Type 2 diabetes mellitus with hyperglycemia: Secondary | ICD-10-CM

## 2022-12-29 DIAGNOSIS — M25572 Pain in left ankle and joints of left foot: Secondary | ICD-10-CM | POA: Diagnosis present

## 2022-12-29 HISTORY — DX: Anemia, unspecified: D64.9

## 2022-12-29 HISTORY — PX: ORIF ANKLE FRACTURE: SHX5408

## 2022-12-29 HISTORY — DX: Migraine, unspecified, not intractable, without status migrainosus: G43.909

## 2022-12-29 HISTORY — DX: Unspecified osteoarthritis, unspecified site: M19.90

## 2022-12-29 LAB — GLUCOSE, CAPILLARY
Glucose-Capillary: 106 mg/dL — ABNORMAL HIGH (ref 70–99)
Glucose-Capillary: 129 mg/dL — ABNORMAL HIGH (ref 70–99)

## 2022-12-29 SURGERY — OPEN REDUCTION INTERNAL FIXATION (ORIF) ANKLE FRACTURE
Anesthesia: Regional | Site: Ankle | Laterality: Left

## 2022-12-29 MED ORDER — LIDOCAINE HCL (PF) 2 % IJ SOLN
INTRAMUSCULAR | Status: AC
  Filled 2022-12-29: qty 10

## 2022-12-29 MED ORDER — OXYCODONE HCL 5 MG PO TABS
5.0000 mg | ORAL_TABLET | ORAL | 0 refills | Status: DC | PRN
Start: 2022-12-29 — End: 2023-06-16

## 2022-12-29 MED ORDER — INSULIN ASPART 100 UNIT/ML IJ SOLN: 0.0000 [IU] | INTRAMUSCULAR | Status: DC | PRN

## 2022-12-29 MED ORDER — ROCURONIUM BROMIDE 10 MG/ML (PF) SYRINGE
PREFILLED_SYRINGE | INTRAVENOUS | Status: AC
  Filled 2022-12-29: qty 10

## 2022-12-29 MED ORDER — ASPIRIN 81 MG PO CHEW: 81.0000 mg | CHEWABLE_TABLET | Freq: Two times a day (BID) | ORAL | 0 refills | Status: DC

## 2022-12-29 MED ORDER — PROPOFOL 10 MG/ML IV BOLUS
INTRAVENOUS | Status: DC | PRN
Start: 2022-12-29 — End: 2022-12-29
  Administered 2022-12-29: 150 mg via INTRAVENOUS

## 2022-12-29 MED ORDER — MIDAZOLAM HCL 2 MG/2ML IJ SOLN: 1.0000 mg | INTRAMUSCULAR | Status: DC

## 2022-12-29 MED ORDER — TRANEXAMIC ACID-NACL 1000-0.7 MG/100ML-% IV SOLN
1000.0000 mg | INTRAVENOUS | Status: AC
  Administered 2022-12-29: 1000 mg via INTRAVENOUS
  Filled 2022-12-29: qty 100

## 2022-12-29 MED ORDER — POVIDONE-IODINE 10 % EX SWAB: 2.0000 | Freq: Once | CUTANEOUS | Status: DC

## 2022-12-29 MED ORDER — SUCCINYLCHOLINE CHLORIDE 200 MG/10ML IV SOSY
PREFILLED_SYRINGE | INTRAVENOUS | Status: DC | PRN
  Administered 2022-12-29: 100 mg via INTRAVENOUS

## 2022-12-29 MED ORDER — LABETALOL HCL 5 MG/ML IV SOLN
INTRAVENOUS | Status: AC
  Filled 2022-12-29: qty 4

## 2022-12-29 MED ORDER — MIDAZOLAM HCL 2 MG/2ML IJ SOLN
INTRAMUSCULAR | Status: AC
  Administered 2022-12-29: 2 mg via INTRAVENOUS
  Filled 2022-12-29: qty 2

## 2022-12-29 MED ORDER — CELECOXIB 100 MG PO CAPS: 100.0000 mg | ORAL_CAPSULE | Freq: Two times a day (BID) | ORAL | 0 refills | Status: DC

## 2022-12-29 MED ORDER — ACETAMINOPHEN 500 MG PO TABS
1000.0000 mg | ORAL_TABLET | Freq: Once | ORAL | Status: AC
  Administered 2022-12-29: 1000 mg via ORAL
  Filled 2022-12-29: qty 2

## 2022-12-29 MED ORDER — HYDROMORPHONE HCL 2 MG/ML IJ SOLN
INTRAMUSCULAR | Status: AC
  Filled 2022-12-29: qty 1

## 2022-12-29 MED ORDER — FENTANYL CITRATE PF 50 MCG/ML IJ SOSY
PREFILLED_SYRINGE | INTRAMUSCULAR | Status: AC
  Filled 2022-12-29: qty 2

## 2022-12-29 MED ORDER — KETOROLAC TROMETHAMINE 30 MG/ML IJ SOLN: 30.0000 mg | Freq: Once | INTRAMUSCULAR | Status: DC | PRN

## 2022-12-29 MED ORDER — VANCOMYCIN HCL 1000 MG IV SOLR
INTRAVENOUS | Status: AC
  Filled 2022-12-29: qty 20

## 2022-12-29 MED ORDER — VANCOMYCIN HCL 1000 MG IV SOLR
INTRAVENOUS | Status: DC | PRN
  Administered 2022-12-29: 1000 mg via TOPICAL

## 2022-12-29 MED ORDER — CEFAZOLIN SODIUM-DEXTROSE 2-4 GM/100ML-% IV SOLN
2.0000 g | INTRAVENOUS | Status: AC
  Administered 2022-12-29: 2 g via INTRAVENOUS
  Filled 2022-12-29: qty 100

## 2022-12-29 MED ORDER — OXYCODONE HCL 5 MG PO TABS: 5.0000 mg | ORAL_TABLET | Freq: Once | ORAL | Status: DC | PRN

## 2022-12-29 MED ORDER — CHLORHEXIDINE GLUCONATE 0.12 % MT SOLN
15.0000 mL | Freq: Once | OROMUCOSAL | Status: AC
  Administered 2022-12-29: 15 mL via OROMUCOSAL

## 2022-12-29 MED ORDER — PHENYLEPHRINE HCL-NACL 20-0.9 MG/250ML-% IV SOLN
INTRAVENOUS | Status: DC | PRN
  Administered 2022-12-29: 25 ug/min via INTRAVENOUS

## 2022-12-29 MED ORDER — POVIDONE-IODINE 7.5 % EX SOLN: Freq: Once | CUTANEOUS | Status: DC

## 2022-12-29 MED ORDER — LACTATED RINGERS IV SOLN: INTRAVENOUS | Status: DC | PRN

## 2022-12-29 MED ORDER — LACTATED RINGERS IV SOLN: INTRAVENOUS | Status: DC

## 2022-12-29 MED ORDER — PHENYLEPHRINE HCL (PRESSORS) 10 MG/ML IV SOLN
INTRAVENOUS | Status: AC
  Filled 2022-12-29: qty 1

## 2022-12-29 MED ORDER — FENTANYL CITRATE (PF) 100 MCG/2ML IJ SOLN
INTRAMUSCULAR | Status: AC
  Filled 2022-12-29: qty 2

## 2022-12-29 MED ORDER — PHENYLEPHRINE HCL (PRESSORS) 10 MG/ML IV SOLN
INTRAVENOUS | Status: DC | PRN
  Administered 2022-12-29 (×4): 80 ug via INTRAVENOUS

## 2022-12-29 MED ORDER — FENTANYL CITRATE PF 50 MCG/ML IJ SOSY: 25.0000 ug | PREFILLED_SYRINGE | INTRAMUSCULAR | Status: DC | PRN

## 2022-12-29 MED ORDER — METHOCARBAMOL 500 MG PO TABS: 500.0000 mg | ORAL_TABLET | Freq: Three times a day (TID) | ORAL | 1 refills | Status: DC | PRN

## 2022-12-29 MED ORDER — DEXAMETHASONE SODIUM PHOSPHATE 4 MG/ML IJ SOLN
INTRAMUSCULAR | Status: DC | PRN
  Administered 2022-12-29: 8 mg via INTRAVENOUS

## 2022-12-29 MED ORDER — LIDOCAINE HCL (CARDIAC) PF 100 MG/5ML IV SOSY
PREFILLED_SYRINGE | INTRAVENOUS | Status: DC | PRN
  Administered 2022-12-29: 80 mg via INTRAVENOUS

## 2022-12-29 MED ORDER — BUPIVACAINE HCL (PF) 0.25 % IJ SOLN
INTRAMUSCULAR | Status: DC | PRN
  Administered 2022-12-29: 20 mL via PERINEURAL

## 2022-12-29 MED ORDER — OXYCODONE HCL 5 MG/5ML PO SOLN: 5.0000 mg | Freq: Once | ORAL | Status: DC | PRN

## 2022-12-29 MED ORDER — BUPIVACAINE-EPINEPHRINE (PF) 0.5% -1:200000 IJ SOLN
INTRAMUSCULAR | Status: DC | PRN
Start: 2022-12-29 — End: 2022-12-29
  Administered 2022-12-29: 30 mL via PERINEURAL

## 2022-12-29 MED ORDER — AMISULPRIDE (ANTIEMETIC) 5 MG/2ML IV SOLN: 10.0000 mg | Freq: Once | INTRAVENOUS | Status: DC | PRN

## 2022-12-29 MED ORDER — ONDANSETRON HCL 4 MG/2ML IJ SOLN
INTRAMUSCULAR | Status: DC | PRN
  Administered 2022-12-29: 4 mg via INTRAVENOUS

## 2022-12-29 MED ORDER — PROMETHAZINE HCL 25 MG/ML IJ SOLN: 6.2500 mg | INTRAMUSCULAR | Status: DC | PRN

## 2022-12-29 MED ORDER — ORAL CARE MOUTH RINSE: 15.0000 mL | Freq: Once | OROMUCOSAL | Status: AC

## 2022-12-29 MED ORDER — FENTANYL CITRATE (PF) 100 MCG/2ML IJ SOLN
INTRAMUSCULAR | Status: DC | PRN
  Administered 2022-12-29 (×2): 50 ug via INTRAVENOUS
  Administered 2022-12-29: 100 ug via INTRAVENOUS

## 2022-12-29 MED ORDER — BUPIVACAINE HCL (PF) 0.5 % IJ SOLN
INTRAMUSCULAR | Status: AC
  Filled 2022-12-29: qty 60

## 2022-12-29 MED ORDER — FENTANYL CITRATE PF 50 MCG/ML IJ SOSY: 50.0000 ug | PREFILLED_SYRINGE | INTRAMUSCULAR | Status: DC

## 2022-12-29 SURGICAL SUPPLY — 65 items
BAG COUNTER SPONGE SURGICOUNT (BAG) IMPLANT
BAG SPEC THK2 15X12 ZIP CLS (MISCELLANEOUS) ×1
BAG SPNG CNTER NS LX DISP (BAG)
BAG ZIPLOCK 12X15 (MISCELLANEOUS) ×1 IMPLANT
BIT DRILL 2.7 QC CANN 155 (BIT) IMPLANT
BIT DRILL OVR 3.5AO QC SHRT SM (DRILL) IMPLANT
BIT DRILL QC 2.0 SHORT EVOS SM (DRILL) IMPLANT
BIT DRILL QC 2.5MM SHRT EVO SM (DRILL) IMPLANT
BNDG CMPR 5X4 KNIT ELC UNQ LF (GAUZE/BANDAGES/DRESSINGS) ×1
BNDG CMPR 6 X 5 YARDS HK CLSR (GAUZE/BANDAGES/DRESSINGS) ×2
BNDG CMPR 9X4 STRL LF SNTH (GAUZE/BANDAGES/DRESSINGS) ×1
BNDG ELASTIC 4INX 5YD STR LF (GAUZE/BANDAGES/DRESSINGS) ×1 IMPLANT
BNDG ELASTIC 6INX 5YD STR LF (GAUZE/BANDAGES/DRESSINGS) ×1 IMPLANT
BNDG ESMARK 4X9 LF (GAUZE/BANDAGES/DRESSINGS) ×1 IMPLANT
CUFF TOURN SGL QUICK 34 (TOURNIQUET CUFF) ×1
CUFF TRNQT CYL 34X4.125X (TOURNIQUET CUFF) ×1 IMPLANT
DRAPE C-ARM 42X120 X-RAY (DRAPES) ×1 IMPLANT
DRAPE U-SHAPE 47X51 STRL (DRAPES) ×2 IMPLANT
DRILL OVER 3.5 AO QC SHORT SM (DRILL) ×1
DRILL QC 2.0 SHORT EVOS SM (DRILL) ×1
DRILL QC 2.5MM SHORT EVOS SM (DRILL) ×1
DRSG AQUACEL AG ADV 3.5X 6 (GAUZE/BANDAGES/DRESSINGS) IMPLANT
DRSG AQUACEL AG ADV 3.5X10 (GAUZE/BANDAGES/DRESSINGS) IMPLANT
ELECT CAUTERY BLADE TIP 2.5 (TIP) ×1
ELECT REM PT RETURN 15FT ADLT (MISCELLANEOUS) ×1 IMPLANT
ELECTRODE CAUTERY BLDE TIP 2.5 (TIP) ×1 IMPLANT
GAUZE PAD ABD 8X10 STRL (GAUZE/BANDAGES/DRESSINGS) ×4 IMPLANT
GAUZE SPONGE 4X4 12PLY STRL (GAUZE/BANDAGES/DRESSINGS) ×2 IMPLANT
GLOVE SURG ORTHO 8.0 STRL STRW (GLOVE) ×1 IMPLANT
GOWN STRL REUS W/ TWL LRG LVL3 (GOWN DISPOSABLE) ×1 IMPLANT
GOWN STRL REUS W/TWL LRG LVL3 (GOWN DISPOSABLE) ×1
K-WIRE 1.3X40 (WIRE) ×2
KIT TURNOVER KIT A (KITS) IMPLANT
KWIRE 1.3X40 (WIRE) IMPLANT
NDL HYPO 22X1.5 SAFETY MO (MISCELLANEOUS) ×1 IMPLANT
NEEDLE HYPO 22X1.5 SAFETY MO (MISCELLANEOUS) ×1 IMPLANT
NS IRRIG 1000ML POUR BTL (IV SOLUTION) ×1 IMPLANT
PACK ORTHO EXTREMITY (CUSTOM PROCEDURE TRAY) ×1 IMPLANT
PAD CAST 4YDX4 CTTN HI CHSV (CAST SUPPLIES) ×1 IMPLANT
PADDING CAST COTTON 4X4 STRL (CAST SUPPLIES) ×1
PADDING CAST COTTON 6X4 STRL (CAST SUPPLIES) ×1 IMPLANT
PENCIL SMOKE EVACUATOR (MISCELLANEOUS) IMPLANT
PLATE 5H L 81MM FIBULA EVOS (Plate) IMPLANT
PROTECTOR NERVE ULNAR (MISCELLANEOUS) ×1 IMPLANT
SCREW BONE 4.0X16MM OST (Screw) IMPLANT
SCREW CANNULATED 4.1X40 (Screw) IMPLANT
SCREW CORT 2.7X12 EVOS (Screw) IMPLANT
SCREW CORT 2.7X14 T8 EVOS (Screw) IMPLANT
SCREW CORT 3.5X10MM ST EVOS (Screw) IMPLANT
SCREW CORT 3.5X13MM (Screw) IMPLANT
SCREW CORT 3.5X14 ST EVOS (Screw) IMPLANT
SCREW CORT 3.5X8MM ST EVOS (Screw) IMPLANT
SCREW EVOS 2.7X11 LOCK T8 (Screw) IMPLANT
SCREW LOCK 2.7X13 ST EVOS (Screw) IMPLANT
SPIKE FLUID TRANSFER (MISCELLANEOUS) ×1 IMPLANT
STOCKINETTE 8 INCH (MISCELLANEOUS) ×1 IMPLANT
SUCTION TUBE FRAZIER 12FR DISP (SUCTIONS) IMPLANT
SUT ETHILON 4 0 PS 2 18 (SUTURE) ×2 IMPLANT
SUT VIC AB 2-0 CT1 27 (SUTURE) ×1
SUT VIC AB 2-0 CT1 TAPERPNT 27 (SUTURE) ×1 IMPLANT
SYR CONTROL 10ML LL (SYRINGE) ×1 IMPLANT
TOWEL GREEN STERILE FF (TOWEL DISPOSABLE) ×1 IMPLANT
TOWEL OR 17X26 10 PK STRL BLUE (TOWEL DISPOSABLE) ×2 IMPLANT
TUBING CONNECTING 10 (TUBING) ×1 IMPLANT
WATER STERILE IRR 1000ML POUR (IV SOLUTION) ×1 IMPLANT

## 2022-12-29 NOTE — Brief Op Note (Signed)
   12/29/2022  5:20 PM  PATIENT:  Adiyah J Delbridge  57 y.o. female  PRE-OPERATIVE DIAGNOSIS:  left ankle bimalleolar fracture  POST-OPERATIVE DIAGNOSIS:  left ankle bimalleolar fracture  PROCEDURE:  Procedure(s): OPEN REDUCTION INTERNAL FIXATION (ORIF) ANKLE FRACTURE  SURGEON:  Surgeon(s): Cammy Copa, MD  ASSISTANT: magnant pa  ANESTHESIA:   general  EBL: 20 ml    Total I/O In: 1200 [I.V.:1000; IV Piggyback:200] Out: 20 [Blood:20]  BLOOD ADMINISTERED: none  DRAINS: none   LOCAL MEDICATIONS USED:  vanco  SPECIMEN:  No Specimen  COUNTS:  YES  TOURNIQUET:  * Missing tourniquet times found for documented tourniquets in log: [id number] *  DICTATION: .Other Dictation: Dictation Number [id number]   PLAN OF CARE: Discharge to home after PACU  PATIENT DISPOSITION:  PACU - hemodynamically stable

## 2022-12-29 NOTE — Anesthesia Procedure Notes (Signed)
Anesthesia Regional Block: Popliteal block   Pre-Anesthetic Checklist: , timeout performed,  Correct Patient, Correct Site, Correct Laterality,  Correct Procedure, Correct Position, site marked,  Risks and benefits discussed,  Surgical consent,  Pre-op evaluation,  At surgeon's request and post-op pain management  Laterality: Left  Prep: chloraprep       Needles:  Injection technique: Single-shot  Needle Type: Echogenic Stimulator Needle     Needle Length: 10cm  Needle Gauge: 20     Additional Needles:   Procedures:,,,, ultrasound used (permanent image in chart),,    Narrative:  Start time: 12/29/2022 2:15 PM End time: 12/29/2022 2:25 PM Injection made incrementally with aspirations every 5 mL.  Performed by: Personally  Anesthesiologist: Leonides Grills, MD  Additional Notes: Functioning IV was confirmed and monitors were applied.  A timeout was performed. Sterile prep, hand hygiene and sterile gloves were used. A 20ga Bbraun echogenic stimulator needle was used. Negative aspiration and negative test dose prior to incremental administration of local anesthetic. The patient tolerated the procedure well.  Ultrasound guidance: relevent anatomy identified, needle position confirmed, local anesthetic spread visualized around nerve(s), vascular puncture avoided.  Image printed for medical record.

## 2022-12-29 NOTE — Anesthesia Procedure Notes (Signed)
Procedure Name: Intubation Date/Time: 12/29/2022 3:08 PM  Performed by: Deri Fuelling, CRNAPre-anesthesia Checklist: Patient identified, Emergency Drugs available, Suction available and Patient being monitored Patient Re-evaluated:Patient Re-evaluated prior to induction Oxygen Delivery Method: Circle system utilized Preoxygenation: Pre-oxygenation with 100% oxygen Induction Type: IV induction Ventilation: Mask ventilation without difficulty Laryngoscope Size: Mac and 4 Grade View: Grade I Tube type: Oral Tube size: 7.0 mm Number of attempts: 1 Airway Equipment and Method: Stylet and Oral airway Placement Confirmation: ETT inserted through vocal cords under direct vision, positive ETCO2 and breath sounds checked- equal and bilateral Secured at: 21 cm Tube secured with: Tape Dental Injury: Teeth and Oropharynx as per pre-operative assessment

## 2022-12-29 NOTE — Op Note (Signed)
[name], [name] MEDICAL RECORD NO: [id number] ACCOUNT NO: [id number] DATE OF BIRTH: 29-Oct-1965 FACILITY: [facility] LOCATION: [facility] PHYSICIAN: Graylin Shiver. August Saucer, MD  Operative Report   PREOPERATIVE DIAGNOSIS:  Left ankle bimalleolar ankle fracture, unstable.  POSTOPERATIVE DIAGNOSIS:  Left ankle bimalleolar ankle fracture, unstable.  PROCEDURE:  Left ankle open reduction and internal fixation of bimalleolar ankle fracture.  SURGEON:  Graylin Shiver. August Saucer, MD  ASSISTANT:  Karenann Cai.  INDICATIONS:  The patient is a 57 year old patient who sustained left ankle injury 2 days ago.  Presents for operative management for unstable left ankle fracture.  PROCEDURE IN DETAIL:  The patient was brought to the operating room where general anesthetic was induced.  Preoperative antibiotics administered.  Timeout was called.  Left leg was prescrubbed with alcohol, Betadine, allowed to air dry, prepped with  DuraPrep solution and draped in sterile manner.  Ioban used to cover the operative field.  Leg was elevated and ankle Esmarch was utilized.  Timeout was called.  Incision made on the lateral malleolus extending about 8 cm proximal to the tip of the  malleolus.  Skin and subcutaneous tissue sharply divided.  Care was taken to avoid injury to the superficial peroneal nerve.  Periosteal elevation was performed off of the distal fracture fragment.  This was carried proximally.  Fracture site was  identified.  Fracture site was reduced and provisionally held with a lag screw.  Lateral 5 hole plate from [employer] was applied.  We placed a secondary lag screw through the plate to achieve better reduction of the fracture.  Bone quality was  excellent.  Locking screws placed distally, nonlocking screws proximally.  Overall, secure fixation was achieved.  Syndesmosis was stable under stress.  This incision was irrigated and moist sponge was placed over it.  Then, we turned our attention to  the medial  side.  Skin and subcutaneous tissue were divided over the medial malleolus extending about 3 cm proximal to the fracture.  Care was taken to avoid injury to the saphenous nerve and vein.  Fracture was irrigated and the fracture lines were  visible.  Fracture was then reduced, held with a towel clamp and two 40 mm cannulated screws were placed across the fracture with good reduction achieved in the AP and lateral planes under fluoroscopy.  At this time, tourniquet was released.  Three  liters of irrigating solution utilized to irrigate the incisions.  Vancomycin powder placed. On the medial side, the incision was closed using 2-0 Vicryl and 3-0 nylon.  On the lateral side, the incision was closed using 2-0 Vicryl and 3-0 nylon.   Aquacel dressing followed by well padded posterior splint was applied.  The patient was then transferred to the recovery room in stable condition.  Luke's assistance was required at all times for opening, closing, mobilization of tissues.  His assistance  was a medical necessity.   NIK D: 12/29/2022 5:24:09 pm T: 12/29/2022 8:02:00 pm  JOB: [id number]/ [id number]

## 2022-12-29 NOTE — Transfer of Care (Signed)
Immediate Anesthesia Transfer of Care Note  Patient: Konica J Sieloff  Procedure(s) Performed: OPEN REDUCTION INTERNAL FIXATION (ORIF) ANKLE FRACTURE (Left: Ankle)  Patient Location: PACU  Anesthesia Type:GA combined with regional for post-op pain  Level of Consciousness: awake and alert   Airway & Oxygen Therapy: Patient Spontanous Breathing and Patient connected to face mask oxygen  Post-op Assessment: Report given to RN and Post -op Vital signs reviewed and stable  Post vital signs: Reviewed and stable  Last Vitals:  Vitals Value Taken Time  BP 112/80 12/29/22 1720  Temp    Pulse 83 12/29/22 1722  Resp 12 12/29/22 1722  SpO2 95 % 12/29/22 1722  Vitals shown include unfiled device data.  Last Pain:  Vitals:   12/29/22 1435  PainSc: 0-No pain      Patients Stated Pain Goal: 3 (12/28/22 1055)  Complications: No notable events documented.

## 2022-12-29 NOTE — Progress Notes (Signed)
Office Visit Note   Patient: [name]           Date of Birth: [birth year]           MRN: [id number] Visit Date: 12/28/2022 Requested by: Pincus Sanes, MD [address] [place],  [place] [address] PCP: Pincus Sanes, MD  Subjective: Chief Complaint  Patient presents with   Left Ankle - Injury    DOI: 12/26/22    HPI: [name] is a 57 y.o. female who presents to the office reporting left ankle pain.  Patient had an injury 2 days ago where she sustained a fracture to her left ankle.  This was reduced and splinted.  Seen in the clinic yesterday with intact skin around the ankle joint.  Presents now for operative management of unstable bimalleolar ankle fracture.  No personal or family history of DVT or pulmonary embolism.  Denies any other orthopedic complaints..                ROS: All systems reviewed are negative as they relate to the chief complaint within the history of present illness.  Patient denies fevers or chills.  Assessment & Plan: Visit Diagnoses:  1. Closed fracture of left ankle, initial encounter     Plan: Impression is unstable left bimalleolar ankle fracture.  Syndesmosis appears to be stable based on fracture morphology on the lateral side.  Plan at this time is open reduction internal fixation of bimalleolar ankle fracture with possible syndesmotic fixation depending on stress test under fluoroscopy.  The risk and benefits of surgical intervention are discussed with the patient including not limited to infection nerve and vessel damage incomplete restoration of motion as well as possible development of arthritis.  Patient understands the risk and benefits.  All questions answered.  Follow-Up Instructions: No follow-ups on file.   Orders:  No orders of the defined types were placed in this encounter.  No orders of the defined types were placed in this encounter.     Procedures: No procedures performed   Clinical Data: No additional  findings.  Objective: Vital Signs: There were no vitals taken for this visit.  Physical Exam:  Constitutional: Patient appears well-developed HEENT:  Head: Normocephalic Eyes:EOM are normal Neck: Normal range of motion Cardiovascular: Normal rate Pulmonary/chest: Effort normal Neurologic: Patient is alert Skin: Skin is warm Psychiatric: Patient has normal mood and affect  Ortho Exam: Examination of the left ankle demonstrates some swelling but no fracture blisters.  Compartments are soft.  Sensation intact on the dorsal and plantar aspect of the foot.  Pedal pulses palpable.  Tenderness is present on the medial and lateral malleolar regions.  No knee effusion.  Specialty Comments:  No specialty comments available.  Imaging: No results found.   PMFS History: Patient Active Problem List   Diagnosis Date Noted   Episodic lightheadedness 02/23/2022   Acute pain of left knee 12/07/2021   Onychomycosis of toenail 06/08/2021   Right knee pain 11/30/2020   Migraines - Dr Lucia Gaskins 05/31/2020   Ventral hernia without obstruction or gangrene 11/14/2019   Hair loss 11/14/2019   Cyst of nasal sinus 06/11/2019   Deviated nasal septum 06/11/2019   Thyroid nodule 06/11/2019   Cervicogenic headache 05/17/2019   Ganglion cyst of wrist, left 05/29/2018   Hx of colonic polyp 09/20/2017   Anal skin tag 09/13/2017   Anxiety and depression 05/24/2017   Atrophic vaginitis 01/30/2017   Lateral epicondylitis 05/23/2016   Diabetes (HCC)  05/23/2016   Snoring 09/07/2015   Insomnia 06/02/2009   Morbid obesity (HCC) 05/19/2008   Hyperlipidemia 04/17/2008   Essential hypertension 04/17/2008   ALLERGIC RHINITIS 04/17/2008   Past Medical History:  Diagnosis Date   ALLERGIC RHINITIS    Anal skin tag 09/13/2017   Anemia    History of   Cataract    Bilateral   COMMON MIGRAINE    GLUCOSE INTOLERANCE    Hx of colonic polyp 09/20/2017   Hypercalcemia    HYPERLIPIDEMIA    HYPERTENSION     INSOMNIA    Migraines    OA (osteoarthritis)    OBESITY    RENAL INSUFFICIENCY     Family History  Problem Relation Age of Onset   Hypertension Mother    Dementia Mother    Other Father        MVA   Diabetes Maternal Grandfather    Asthma Brother    Rheum arthritis Maternal Aunt    Colon cancer Neg Hx    Stomach cancer Neg Hx    Rectal cancer Neg Hx     Past Surgical History:  Procedure Laterality Date   BREAST SURGERY  05/09/1996   Implants   cataract Bilateral    CESAREAN SECTION     hypertension post partum  09/07/2006   Not precalmpsia   Social History   Occupational History   Occupation: [employer]  Tobacco Use   Smoking status: Former    Current packs/day: 0.00    Average packs/day: 0.3 packs/day for 8.0 years (2.0 ttl pk-yrs)    Types: Cigarettes    Start date: 05/09/1997    Quit date: 05/09/2005    Years since quitting: 17.6   Smokeless tobacco: Never   Tobacco comments:    Married, lives with spouse and son. 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  smoked socially  off and on until she became pregnant.  Vaping Use   Vaping status: Every Day  Substance and Sexual Activity   Alcohol use: Yes    Comment: social   Drug use: No   Sexual activity: Not on file

## 2022-12-29 NOTE — Anesthesia Postprocedure Evaluation (Signed)
Anesthesia Post Note  Patient: [name]  Procedure(s) Performed: OPEN REDUCTION INTERNAL FIXATION (ORIF) ANKLE FRACTURE (Left: Ankle)     Patient location during evaluation: PACU Anesthesia Type: Regional and General Level of consciousness: awake Pain management: pain level controlled Vital Signs Assessment: post-procedure vital signs reviewed and stable Respiratory status: spontaneous breathing, nonlabored ventilation and respiratory function stable Cardiovascular status: blood pressure returned to baseline and stable Postop Assessment: no apparent nausea or vomiting Anesthetic complications: no   No notable events documented.  Last Vitals:  Vitals:   12/29/22 1810 12/29/22 1815  BP: (!) 129/90 (!) 129/91  Pulse: 85 83  Resp:    Temp:  36.7 C  SpO2: 92% 94%    Last Pain:  Vitals:   12/29/22 1815  PainSc: 0-No pain                 Kym Fenter P Mayan Dolney

## 2022-12-29 NOTE — H&P (Signed)
[name] is an 57 y.o. female.   Chief Complaint: left ankle pain HPI: [name] is a 57 y.o. female who presents to the office reporting left ankle pain.  Patient had an injury 2 days ago where she sustained a fracture to her left ankle.  This was reduced and splinted.  Seen in the clinic yesterday with intact skin around the ankle joint.  Presents now for operative management of unstable bimalleolar ankle fracture.  No personal or family history of DVT or pulmonary embolism.  Denies any other orthopedic complaints.   Past Medical History:  Diagnosis Date   ALLERGIC RHINITIS    Anal skin tag 09/13/2017   Anemia    History of   Cataract    Bilateral   COMMON MIGRAINE    GLUCOSE INTOLERANCE    Hx of colonic polyp 09/20/2017   Hypercalcemia    HYPERLIPIDEMIA    HYPERTENSION    INSOMNIA    Migraines    OA (osteoarthritis)    OBESITY    RENAL INSUFFICIENCY     Past Surgical History:  Procedure Laterality Date   BREAST SURGERY  05/09/1996   Implants   cataract Bilateral    CESAREAN SECTION     hypertension post partum  09/07/2006   Not precalmpsia    Family History  Problem Relation Age of Onset   Hypertension Mother    Dementia Mother    Other Father        MVA   Diabetes Maternal Grandfather    Asthma Brother    Rheum arthritis Maternal Aunt    Colon cancer Neg Hx    Stomach cancer Neg Hx    Rectal cancer Neg Hx    Social History:  reports that she quit smoking about 17 years ago. Her smoking use included cigarettes. She started smoking about 25 years ago. She has a 2 pack-year smoking history. She has never used smokeless tobacco. She reports current alcohol use. She reports that she does not use drugs.  Allergies:  Allergies  Allergen Reactions   Lisinopril     REACTION: depressed mood   Olmesartan Medoxomil     REACTION: Headaches, dizziness    Medications Prior to Admission  Medication Sig Dispense Refill   amLODipine (NORVASC) 5 MG  tablet TAKE 1 TABLET BY MOUTH DAILY GENERIC EQUIVALENT FOR NORVASC 90 tablet 1   atorvastatin (LIPITOR) 40 MG tablet Take 1 tablet (40 mg total) by mouth daily. 90 tablet 3   busPIRone (BUSPAR) 5 MG tablet TAKE 1 TABLET (5 MG TOTAL) BY MOUTH 2 (TWO) TIMES DAILY AS NEEDED (ANXIETY). 180 tablet 2   escitalopram (LEXAPRO) 10 MG tablet Take 1 tablet (10 mg total) by mouth daily. 90 tablet 2   fluticasone (FLONASE) 50 MCG/ACT nasal spray Place 2 sprays into both nostrils 2 (two) times daily as needed for allergies.     icosapent Ethyl (VASCEPA) 1 g capsule Take 2 capsules (2 g total) by mouth 2 (two) times daily. 120 capsule 5   ipratropium-albuterol (DUONEB) 0.5-2.5 (3) MG/3ML SOLN Take 3 mLs by nebulization every 6 (six) hours as needed. 360 mL 0   labetalol (NORMODYNE) 200 MG tablet TAKE TWO TABLETS BY MOUTH THREE TIMES DAILY 540 tablet 1   metFORMIN (GLUCOPHAGE) 500 MG tablet Take 1 tablet (500 mg total) by mouth 2 (two) times daily with a meal. 180 tablet 1   oxyCODONE-acetaminophen (PERCOCET/ROXICET) 5-325 MG tablet Take 1 tablet by mouth every 6 (six) hours as  needed for severe pain. 15 tablet 0   RESTASIS 0.05 % ophthalmic emulsion INSTILL 1 DROP INTO BOTH EYES TWICE A DAY INSTILL 1 DROP INTO BOTH EYES TWICE DAILY     tirzepatide (MOUNJARO) 2.5 MG/0.5ML Pen Inject 2.5 mg into the skin once a week. 2 mL 0   triamterene-hydrochlorothiazide (DYAZIDE) 37.5-25 MG capsule Take 1 each (1 capsule total) by mouth daily. 90 capsule 2   Azelastine HCl 137 MCG/SPRAY SOLN PLACE 1-2 SPRAYS INTO BOTH NOSTRILS 2 (TWO) TIMES DAILY. USE IN EACH NOSTRIL AS DIRECTED (Patient not taking: Reported on 12/28/2022) 30 mL 2   eszopiclone (LUNESTA) 2 MG TABS tablet Take 1 tablet (2 mg total) by mouth at bedtime as needed for sleep. Take immediately before bedtime (Patient not taking: Reported on 12/28/2022) 30 tablet 0   glucose blood (ONETOUCH VERIO) test strip Use as instructed to check sugar daily 100 strip 3   glucose  blood test strip OneTouch Verio test strips  TEST 4X DAILY     Semaglutide (RYBELSUS) 7 MG TABS Take 1 tablet (7 mg total) by mouth daily. (Patient not taking: Reported on 12/28/2022) 90 tablet 1    Results for orders placed or performed during the hospital encounter of 12/29/22 (from the past 48 hour(s))  Glucose, capillary     Status: Abnormal   Collection Time: 12/29/22  1:45 PM  Result Value Ref Range   Glucose-Capillary 106 (H) 70 - 99 mg/dL    Comment: Glucose reference range applies only to samples taken after fasting for at least 8 hours.   Comment 1 Notify RN    Comment 2 Document in Chart    DG Ankle Left Port  Result Date: 12/27/2022 CLINICAL DATA:  Injury, postreduction. EXAM: PORTABLE LEFT ANKLE - 2 VIEW COMPARISON:  Radiograph earlier today FINDINGS: Improved alignment of distal fibular fracture postreduction, mild residual lateral displacement of distal fracture fragment. Improved alignment of medial malleolar fracture with mild residual lateral displacement. Decreased lateral subluxation of the talus with respect to the tibial plafond, mild residual displacement. IMPRESSION: Improved alignment of distal fibular and medial malleolar fractures postreduction. Decreased lateral subluxation of the talus with respect to the tibial plafond. Electronically Signed   By: Narda Rutherford M.D.   On: 12/27/2022 18:05    Review of Systems  Musculoskeletal:  Positive for arthralgias.  All other systems reviewed and are negative.   Blood pressure 111/81, pulse 83, resp. rate 14, height 5\' 4"  (1.626 m), weight 89.8 kg, SpO2 96%. Physical Exam Vitals reviewed.  HENT:     Head: Normocephalic.     Nose: Nose normal.     Mouth/Throat:     Mouth: Mucous membranes are moist.  Eyes:     Pupils: Pupils are equal, round, and reactive to light.  Cardiovascular:     Rate and Rhythm: Normal rate.     Pulses: Normal pulses.  Pulmonary:     Effort: Pulmonary effort is normal.  Abdominal:      General: Abdomen is flat.  Musculoskeletal:     Cervical back: Normal range of motion.  Skin:    General: Skin is warm.     Capillary Refill: Capillary refill takes less than 2 seconds.  Neurological:     General: No focal deficit present.     Mental Status: She is alert.  Psychiatric:        Mood and Affect: Mood normal.    Examination of the left ankle demonstrates some swelling but no fracture blisters.  Compartments are soft. Sensation intact on the dorsal and plantar aspect of the foot. Pedal pulses palpable. Tenderness is present on the medial and lateral malleolar regions. No knee effusion.   Assessment/Plan Impression is unstable left bimalleolar ankle fracture. Syndesmosis appears to be stable based on fracture morphology on the lateral side. Plan at this time is open reduction internal fixation of bimalleolar ankle fracture with possible syndesmotic fixation depending on stress test under fluoroscopy. The risk and benefits of surgical intervention are discussed with the patient including not limited to infection nerve and vessel damage incomplete restoration of motion as well as possible development of arthritis. Patient understands the risk and benefits. All questions answered.   Burnard Bunting, MD 12/29/2022, 2:33 PM

## 2022-12-29 NOTE — Anesthesia Procedure Notes (Signed)
Anesthesia Regional Block: Adductor canal block   Pre-Anesthetic Checklist: , timeout performed,  Correct Patient, Correct Site, Correct Laterality,  Correct Procedure,, site marked,  Risks and benefits discussed,  Surgical consent,  Pre-op evaluation,  At surgeon's request and post-op pain management  Laterality: Left  Prep: chloraprep       Needles:  Injection technique: Single-shot  Needle Type: Echogenic Stimulator Needle     Needle Length: 10cm  Needle Gauge: 20     Additional Needles:   Procedures:,,,, ultrasound used (permanent image in chart),,    Narrative:  Start time: 12/29/2022 2:25 PM End time: 12/29/2022 2:35 PM Injection made incrementally with aspirations every 5 mL.  Performed by: Personally  Anesthesiologist: Leonides Grills, MD  Additional Notes: Functioning IV was confirmed and monitors were applied. A time-out was performed. Hand hygiene and sterile gloves were used. The thigh was placed in a frog-leg position and prepped in a sterile fashion. A 20ga Bbraun echogenic stimulator needle was placed using ultrasound guidance.  Negative aspiration and negative test dose prior to incremental administration of local anesthetic. The patient tolerated the procedure well.

## 2022-12-30 ENCOUNTER — Encounter (HOSPITAL_COMMUNITY): Payer: Self-pay | Admitting: Orthopedic Surgery

## 2022-12-30 ENCOUNTER — Other Ambulatory Visit: Payer: Self-pay

## 2023-01-03 ENCOUNTER — Ambulatory Visit: Payer: No Typology Code available for payment source | Admitting: Internal Medicine

## 2023-01-04 ENCOUNTER — Encounter: Payer: Self-pay | Admitting: Internal Medicine

## 2023-01-04 ENCOUNTER — Other Ambulatory Visit: Payer: Self-pay | Admitting: Internal Medicine

## 2023-01-04 ENCOUNTER — Other Ambulatory Visit: Payer: Self-pay

## 2023-01-04 MED ORDER — TIRZEPATIDE 2.5 MG/0.5ML ~~LOC~~ SOAJ: 2.5000 mg | SUBCUTANEOUS | 0 refills | Status: DC

## 2023-01-05 NOTE — Telephone Encounter (Signed)
Left message for patient today.  Also sent message for patient via my-chart.

## 2023-01-06 ENCOUNTER — Ambulatory Visit (INDEPENDENT_AMBULATORY_CARE_PROVIDER_SITE_OTHER): Payer: No Typology Code available for payment source | Admitting: Orthopedic Surgery

## 2023-01-06 ENCOUNTER — Other Ambulatory Visit (INDEPENDENT_AMBULATORY_CARE_PROVIDER_SITE_OTHER): Payer: No Typology Code available for payment source

## 2023-01-06 DIAGNOSIS — Z9889 Other specified postprocedural states: Secondary | ICD-10-CM | POA: Diagnosis not present

## 2023-01-06 DIAGNOSIS — Z8781 Personal history of (healed) traumatic fracture: Secondary | ICD-10-CM

## 2023-01-07 ENCOUNTER — Encounter: Payer: Self-pay | Admitting: Orthopedic Surgery

## 2023-01-07 NOTE — Progress Notes (Signed)
Post-Op Visit Note   Patient: [name]           Date of Birth: 02/19/1966           MRN: [id number] Visit Date: 01/06/2023 PCP: Pincus Sanes, MD   Assessment & Plan:  Chief Complaint:  Chief Complaint  Patient presents with   Left Ankle - Routine Post Op    12/29/22 ORIF left ankle fracture   Visit Diagnoses:  1. S/P ORIF (open reduction internal fixation) fracture     Plan: Patient is now a week out left ankle fracture open reduction internal fixation.  Patient has been doing well.  Pain is controlled.  On examination the incisions are intact.  No calf tenderness negative Homans.  On aspirin for DVT prophylaxis.  Plan at this time is to continue with compression and elevation and nonweightbearing on that left ankle.  Okay to sleep without the fracture boot.  Come back next Wednesday to have the sutures removed.  We will continue range of motion exercises at that time and potentially start some partial weightbearing in the fracture boot at the 3-week mark.  Syndesmosis feels stable.  Follow-Up Instructions: No follow-ups on file.   Orders:  Orders Placed This Encounter  Procedures   XR Ankle Complete Left   No orders of the defined types were placed in this encounter.   Imaging: No results found.  PMFS History: Patient Active Problem List   Diagnosis Date Noted   Episodic lightheadedness 02/23/2022   Acute pain of left knee 12/07/2021   Onychomycosis of toenail 06/08/2021   Right knee pain 11/30/2020   Migraines - Dr Lucia Gaskins 05/31/2020   Ventral hernia without obstruction or gangrene 11/14/2019   Hair loss 11/14/2019   Cyst of nasal sinus 06/11/2019   Deviated nasal septum 06/11/2019   Thyroid nodule 06/11/2019   Cervicogenic headache 05/17/2019   Ganglion cyst of wrist, left 05/29/2018   Hx of colonic polyp 09/20/2017   Anal skin tag 09/13/2017   Anxiety and depression 05/24/2017   Atrophic vaginitis 01/30/2017   Lateral epicondylitis 05/23/2016    Diabetes (HCC) 05/23/2016   Snoring 09/07/2015   Insomnia 06/02/2009   Morbid obesity (HCC) 05/19/2008   Hyperlipidemia 04/17/2008   Essential hypertension 04/17/2008   ALLERGIC RHINITIS 04/17/2008   Past Medical History:  Diagnosis Date   ALLERGIC RHINITIS    Anal skin tag 09/13/2017   Anemia    History of   Cataract    Bilateral   COMMON MIGRAINE    GLUCOSE INTOLERANCE    Hx of colonic polyp 09/20/2017   Hypercalcemia    HYPERLIPIDEMIA    HYPERTENSION    INSOMNIA    Migraines    OA (osteoarthritis)    OBESITY    RENAL INSUFFICIENCY     Family History  Problem Relation Age of Onset   Hypertension Mother    Dementia Mother    Other Father        MVA   Diabetes Maternal Grandfather    Asthma Brother    Rheum arthritis Maternal Aunt    Colon cancer Neg Hx    Stomach cancer Neg Hx    Rectal cancer Neg Hx     Past Surgical History:  Procedure Laterality Date   BREAST SURGERY  05/09/1996   Implants   cataract Bilateral    CESAREAN SECTION     hypertension post partum  09/07/2006   Not precalmpsia   ORIF ANKLE FRACTURE Left 12/29/2022  Procedure: OPEN REDUCTION INTERNAL FIXATION (ORIF) ANKLE FRACTURE;  Surgeon: Cammy Copa, MD;  Location: [facility];  Service: Orthopedics;  Laterality: Left;   Social History   Occupational History   Occupation: [employer]  Tobacco Use   Smoking status: Former    Current packs/day: 0.00    Average packs/day: 0.3 packs/day for 8.0 years (2.0 ttl pk-yrs)    Types: Cigarettes    Start date: 05/09/1997    Quit date: 05/09/2005    Years since quitting: 17.6   Smokeless tobacco: Never   Tobacco comments:    Married, lives with spouse and son. 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  smoked socially  off and on until she became pregnant.  Vaping Use   Vaping status: Every Day  Substance and Sexual Activity   Alcohol use: Yes    Comment: social   Drug use: No   Sexual activity: Not on file

## 2023-01-08 DIAGNOSIS — S82842A Displaced bimalleolar fracture of left lower leg, initial encounter for closed fracture: Secondary | ICD-10-CM

## 2023-01-10 MED ORDER — TIRZEPATIDE 5 MG/0.5ML ~~LOC~~ SOAJ: 5.0000 mg | SUBCUTANEOUS | 0 refills | Status: DC

## 2023-01-10 NOTE — Addendum Note (Signed)
Addended by: Pincus Sanes on: 01/10/2023 08:13 AM   Modules accepted: Orders

## 2023-01-11 ENCOUNTER — Ambulatory Visit (INDEPENDENT_AMBULATORY_CARE_PROVIDER_SITE_OTHER): Payer: No Typology Code available for payment source | Admitting: Surgical

## 2023-01-11 ENCOUNTER — Encounter: Payer: Self-pay | Admitting: Surgical

## 2023-01-11 DIAGNOSIS — Z8781 Personal history of (healed) traumatic fracture: Secondary | ICD-10-CM

## 2023-01-11 DIAGNOSIS — Z9889 Other specified postprocedural states: Secondary | ICD-10-CM

## 2023-01-13 ENCOUNTER — Encounter: Payer: Self-pay | Admitting: Surgical

## 2023-01-13 NOTE — Progress Notes (Signed)
Post-Op Visit Note   Patient: [name]           Date of Birth: [birth year]           MRN: [id number] Visit Date: 01/11/2023 PCP: Pincus Sanes, MD   Assessment & Plan:  Chief Complaint:  Chief Complaint  Patient presents with   Left Ankle - Follow-up    ORIF left ankle 12/29/2022   Visit Diagnoses:  1. S/P ORIF (open reduction internal fixation) fracture     Plan: Patient is a 57 year old female who presents s/p left ankle ORIF on 12/29/2022.  She is about 2 weeks postop.  Only taking ibuprofen for pain control.  She is ambulating with boot and crutches and has not put any weight on her operative leg yet.  No chest pain/shortness breath/calf pain.  No fevers or chills or drainage from the incision.  Sutures intact.  These were removed and replaced with Steri-Strips today.  There is no residual dehiscence of the incisions after removal of sutures.  She will start ankle pump exercises and follow-up with the office in about 10 days for clinical recheck with new radiographs at that time in order to evaluate for callus formation and determine weightbearing status.  Follow-Up Instructions: No follow-ups on file.   Orders:  No orders of the defined types were placed in this encounter.  No orders of the defined types were placed in this encounter.   Imaging: No results found.  PMFS History: Patient Active Problem List   Diagnosis Date Noted   Closed bimalleolar fracture of left ankle 01/08/2023   Episodic lightheadedness 02/23/2022   Acute pain of left knee 12/07/2021   Onychomycosis of toenail 06/08/2021   Right knee pain 11/30/2020   Migraines - Dr Lucia Gaskins 05/31/2020   Ventral hernia without obstruction or gangrene 11/14/2019   Hair loss 11/14/2019   Cyst of nasal sinus 06/11/2019   Deviated nasal septum 06/11/2019   Thyroid nodule 06/11/2019   Cervicogenic headache 05/17/2019   Ganglion cyst of wrist, left 05/29/2018   Hx of colonic polyp 09/20/2017   Anal skin  tag 09/13/2017   Anxiety and depression 05/24/2017   Atrophic vaginitis 01/30/2017   Lateral epicondylitis 05/23/2016   Diabetes (HCC) 05/23/2016   Snoring 09/07/2015   Insomnia 06/02/2009   Morbid obesity (HCC) 05/19/2008   Hyperlipidemia 04/17/2008   Essential hypertension 04/17/2008   ALLERGIC RHINITIS 04/17/2008   Past Medical History:  Diagnosis Date   ALLERGIC RHINITIS    Anal skin tag 09/13/2017   Anemia    History of   Cataract    Bilateral   COMMON MIGRAINE    GLUCOSE INTOLERANCE    Hx of colonic polyp 09/20/2017   Hypercalcemia    HYPERLIPIDEMIA    HYPERTENSION    INSOMNIA    Migraines    OA (osteoarthritis)    OBESITY    RENAL INSUFFICIENCY     Family History  Problem Relation Age of Onset   Hypertension Mother    Dementia Mother    Other Father        MVA   Diabetes Maternal Grandfather    Asthma Brother    Rheum arthritis Maternal Aunt    Colon cancer Neg Hx    Stomach cancer Neg Hx    Rectal cancer Neg Hx     Past Surgical History:  Procedure Laterality Date   BREAST SURGERY  05/09/1996   Implants   cataract Bilateral    CESAREAN SECTION  hypertension post partum  09/07/2006   Not precalmpsia   ORIF ANKLE FRACTURE Left 12/29/2022   Procedure: OPEN REDUCTION INTERNAL FIXATION (ORIF) ANKLE FRACTURE;  Surgeon: Cammy Copa, MD;  Location: [facility];  Service: Orthopedics;  Laterality: Left;   Social History   Occupational History   Occupation: [employer]  Tobacco Use   Smoking status: Former    Current packs/day: 0.00    Average packs/day: 0.3 packs/day for 8.0 years (2.0 ttl pk-yrs)    Types: Cigarettes    Start date: 05/09/1997    Quit date: 05/09/2005    Years since quitting: 17.6   Smokeless tobacco: Never   Tobacco comments:    Married, lives with spouse and son. 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  smoked socially  off and on until she became pregnant.  Vaping Use   Vaping status: Every Day   Substance and Sexual Activity   Alcohol use: Yes    Comment: social   Drug use: No   Sexual activity: Not on file

## 2023-01-18 ENCOUNTER — Encounter: Payer: No Typology Code available for payment source | Admitting: Surgical

## 2023-01-20 ENCOUNTER — Encounter: Payer: No Typology Code available for payment source | Admitting: Surgical

## 2023-01-23 ENCOUNTER — Encounter: Payer: Self-pay | Admitting: Surgical

## 2023-01-23 ENCOUNTER — Other Ambulatory Visit (INDEPENDENT_AMBULATORY_CARE_PROVIDER_SITE_OTHER): Payer: No Typology Code available for payment source

## 2023-01-23 ENCOUNTER — Ambulatory Visit (INDEPENDENT_AMBULATORY_CARE_PROVIDER_SITE_OTHER): Payer: No Typology Code available for payment source | Admitting: Surgical

## 2023-01-23 DIAGNOSIS — Z8781 Personal history of (healed) traumatic fracture: Secondary | ICD-10-CM

## 2023-01-23 DIAGNOSIS — Z9889 Other specified postprocedural states: Secondary | ICD-10-CM | POA: Diagnosis not present

## 2023-01-23 NOTE — Progress Notes (Signed)
Post-Op Visit Note   Patient: [name]           Date of Birth: 06/28/1965           MRN: [id number] Visit Date: 01/23/2023 PCP: Pincus Sanes, MD   Assessment & Plan:  Chief Complaint:  Chief Complaint  Patient presents with   Left Ankle - Routine Post Op   Visit Diagnoses:  1. S/P ORIF (open reduction internal fixation) fracture     Plan: Patient is a 57 year old female who presents s/p left ankle ORIF.  She is about 3 4 weeks out from procedure.  She has been touchdown weightbearing with boot and crutches.  Pain is slowly but steadily improving.  She has put weight fully on her left lower extremity multiple times without any significant discomfort.  No fevers or chills.  No drainage from the incision.  No chest pain or shortness of breath.  Not taking vitamin D.  She has been limiting how much she vapes.  On exam, patient has left ankle with incisions that are healing well.  Lateral incision without issue.  Medial incision has a little bit of redness surrounding the midportion of the incision but there is no dehiscence and no expressible drainage.  There may be 1 impending suture abscess.  Patient was instructed to keep an eye on this location and let us know if anything changes.  She can send [place] a message on MyChart and we can see her sooner if anything changes significantly.  No calf tenderness.  Negative Homans' sign.  Palpable DP pulse.  Minimal tenderness over the medial and lateral malleolar eye.  She has about 8 degrees of passive dorsiflexion with fairly easy range of motion.  Plan is radiographs today of the left ankle.  Radiographs demonstrate no significant callus formation yet.  However with her improving pain and no difficulty with weightbearing for the brief moments that she has done this, fracture is likely healing.  We will obtain vitamin D today and if she is low, supplement this.  She will start to partial weight-bear with crutches 50% through her operative  leg and 50% through the crutches in 1 week.  We will see her back 1 week after that (2 weeks from today) and reevaluate her with Dr. August Saucer.  New radiographs at that time.  Follow-Up Instructions: No follow-ups on file.   Orders:  Orders Placed This Encounter  Procedures   XR Ankle Complete Left   Vitamin D (25 hydroxy)   No orders of the defined types were placed in this encounter.   Imaging: No results found.  PMFS History: Patient Active Problem List   Diagnosis Date Noted   Closed bimalleolar fracture of left ankle 01/08/2023   Episodic lightheadedness 02/23/2022   Acute pain of left knee 12/07/2021   Onychomycosis of toenail 06/08/2021   Right knee pain 11/30/2020   Migraines - Dr Lucia Gaskins 05/31/2020   Ventral hernia without obstruction or gangrene 11/14/2019   Hair loss 11/14/2019   Cyst of nasal sinus 06/11/2019   Deviated nasal septum 06/11/2019   Thyroid nodule 06/11/2019   Cervicogenic headache 05/17/2019   Ganglion cyst of wrist, left 05/29/2018   Hx of colonic polyp 09/20/2017   Anal skin tag 09/13/2017   Anxiety and depression 05/24/2017   Atrophic vaginitis 01/30/2017   Lateral epicondylitis 05/23/2016   Diabetes (HCC) 05/23/2016   Snoring 09/07/2015   Insomnia 06/02/2009   Morbid obesity (HCC) 05/19/2008   Hyperlipidemia 04/17/2008  Essential hypertension 04/17/2008   ALLERGIC RHINITIS 04/17/2008   Past Medical History:  Diagnosis Date   ALLERGIC RHINITIS    Anal skin tag 09/13/2017   Anemia    History of   Cataract    Bilateral   COMMON MIGRAINE    GLUCOSE INTOLERANCE    Hx of colonic polyp 09/20/2017   Hypercalcemia    HYPERLIPIDEMIA    HYPERTENSION    INSOMNIA    Migraines    OA (osteoarthritis)    OBESITY    RENAL INSUFFICIENCY     Family History  Problem Relation Age of Onset   Hypertension Mother    Dementia Mother    Other Father        MVA   Diabetes Maternal Grandfather    Asthma Brother    Rheum arthritis Maternal Aunt     Colon cancer Neg Hx    Stomach cancer Neg Hx    Rectal cancer Neg Hx     Past Surgical History:  Procedure Laterality Date   BREAST SURGERY  05/09/1996   Implants   cataract Bilateral    CESAREAN SECTION     hypertension post partum  09/07/2006   Not precalmpsia   ORIF ANKLE FRACTURE Left 12/29/2022   Procedure: OPEN REDUCTION INTERNAL FIXATION (ORIF) ANKLE FRACTURE;  Surgeon: Cammy Copa, MD;  Location: [facility];  Service: Orthopedics;  Laterality: Left;   Social History   Occupational History   Occupation: [employer]  Tobacco Use   Smoking status: Former    Current packs/day: 0.00    Average packs/day: 0.3 packs/day for 8.0 years (2.0 ttl pk-yrs)    Types: Cigarettes    Start date: 05/09/1997    Quit date: 05/09/2005    Years since quitting: 17.7   Smokeless tobacco: Never   Tobacco comments:    Married, lives with spouse and son. 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  smoked socially  off and on until she became pregnant.  Vaping Use   Vaping status: Every Day  Substance and Sexual Activity   Alcohol use: Yes    Comment: social   Drug use: No   Sexual activity: Not on file

## 2023-01-24 LAB — VITAMIN D 25 HYDROXY (VIT D DEFICIENCY, FRACTURES): Vit D, 25-Hydroxy: 34 ng/mL (ref 30–100)

## 2023-01-25 ENCOUNTER — Other Ambulatory Visit: Payer: Self-pay | Admitting: Surgical

## 2023-01-25 MED ORDER — VITAMIN D (ERGOCALCIFEROL) 1.25 MG (50000 UNIT) PO CAPS: 50000.0000 [IU] | ORAL_CAPSULE | ORAL | 0 refills | Status: DC

## 2023-01-26 ENCOUNTER — Other Ambulatory Visit: Payer: Self-pay | Admitting: Surgical

## 2023-02-01 ENCOUNTER — Other Ambulatory Visit: Payer: Self-pay

## 2023-02-01 MED ORDER — TIRZEPATIDE 5 MG/0.5ML ~~LOC~~ SOAJ: 5.0000 mg | SUBCUTANEOUS | 0 refills | Status: DC

## 2023-02-02 ENCOUNTER — Other Ambulatory Visit: Payer: Self-pay

## 2023-02-02 MED ORDER — TIRZEPATIDE 5 MG/0.5ML ~~LOC~~ SOAJ: 5.0000 mg | SUBCUTANEOUS | 0 refills | Status: DC

## 2023-02-06 ENCOUNTER — Ambulatory Visit (INDEPENDENT_AMBULATORY_CARE_PROVIDER_SITE_OTHER): Payer: No Typology Code available for payment source | Admitting: Surgical

## 2023-02-06 ENCOUNTER — Other Ambulatory Visit (INDEPENDENT_AMBULATORY_CARE_PROVIDER_SITE_OTHER): Payer: No Typology Code available for payment source

## 2023-02-06 ENCOUNTER — Encounter: Payer: Self-pay | Admitting: Surgical

## 2023-02-06 DIAGNOSIS — Z8781 Personal history of (healed) traumatic fracture: Secondary | ICD-10-CM | POA: Diagnosis not present

## 2023-02-06 DIAGNOSIS — Z9889 Other specified postprocedural states: Secondary | ICD-10-CM

## 2023-02-07 ENCOUNTER — Encounter: Payer: Self-pay | Admitting: Surgical

## 2023-02-07 NOTE — Progress Notes (Signed)
Post-Op Visit Note   Patient: [name]           Date of Birth: 03-02-1966           MRN: [id number] Visit Date: 02/06/2023 PCP: Pincus Sanes, MD   Assessment & Plan:  Chief Complaint:  Chief Complaint  Patient presents with   Left Ankle - Follow-up    ORIF left ankle 12/29/2022   Visit Diagnoses:  1. S/P ORIF (open reduction internal fixation) fracture     Plan: Patient is a 57 year old female who presents s/p left ankle ORIF on 12/29/2022.  She is doing well.  She really has no pain.  Taking ibuprofen very occasionally.  Ambulating full weightbearing in fracture boot.  Had a vitamin D checked last visit and this was found to be 34.  She is compliant with taking the vitamin D supplement that was prescribed.  She does not smoke.  No fevers or chills or drainage from the incisions.  On exam, patient has incisions that are healing well with no evidence of infection or dehiscence.  There is excellent dorsiflexion range of motion to about 10 degrees with her knee flexed.  No calf tenderness.  Negative Homans' sign.  She ambulates with minimal antalgia.  Palpable DP pulse.  No tenderness over the fracture sites.  Plan is continue ambulating in fracture boot for 1 more week.  At that point transition to sports ankle brace and ambulate with that in a regular shoe.  Follow-up in 4 weeks for clinical recheck with Dr. August Saucer and new radiographs at that time.  Today's radiographs demonstrate no significant change in position of the fracture or hardware.  There is no evidence of hardware failure.  No callus formation noted yet.  Follow-Up Instructions: No follow-ups on file.   Orders:  Orders Placed This Encounter  Procedures   XR Ankle Complete Left   No orders of the defined types were placed in this encounter.   Imaging: No results found.  PMFS History: Patient Active Problem List   Diagnosis Date Noted   Closed bimalleolar fracture of left ankle 01/08/2023   Episodic  lightheadedness 02/23/2022   Acute pain of left knee 12/07/2021   Onychomycosis of toenail 06/08/2021   Right knee pain 11/30/2020   Migraines - Dr Lucia Gaskins 05/31/2020   Ventral hernia without obstruction or gangrene 11/14/2019   Hair loss 11/14/2019   Cyst of nasal sinus 06/11/2019   Deviated nasal septum 06/11/2019   Thyroid nodule 06/11/2019   Cervicogenic headache 05/17/2019   Ganglion cyst of wrist, left 05/29/2018   Hx of colonic polyp 09/20/2017   Anal skin tag 09/13/2017   Anxiety and depression 05/24/2017   Atrophic vaginitis 01/30/2017   Lateral epicondylitis 05/23/2016   Diabetes (HCC) 05/23/2016   Snoring 09/07/2015   Insomnia 06/02/2009   Morbid obesity (HCC) 05/19/2008   Hyperlipidemia 04/17/2008   Essential hypertension 04/17/2008   ALLERGIC RHINITIS 04/17/2008   Past Medical History:  Diagnosis Date   ALLERGIC RHINITIS    Anal skin tag 09/13/2017   Anemia    History of   Cataract    Bilateral   COMMON MIGRAINE    GLUCOSE INTOLERANCE    Hx of colonic polyp 09/20/2017   Hypercalcemia    HYPERLIPIDEMIA    HYPERTENSION    INSOMNIA    Migraines    OA (osteoarthritis)    OBESITY    RENAL INSUFFICIENCY     Family History  Problem Relation Age of Onset  Hypertension Mother    Dementia Mother    Other Father        MVA   Diabetes Maternal Grandfather    Asthma Brother    Rheum arthritis Maternal Aunt    Colon cancer Neg Hx    Stomach cancer Neg Hx    Rectal cancer Neg Hx     Past Surgical History:  Procedure Laterality Date   BREAST SURGERY  05/09/1996   Implants   cataract Bilateral    CESAREAN SECTION     hypertension post partum  09/07/2006   Not precalmpsia   ORIF ANKLE FRACTURE Left 12/29/2022   Procedure: OPEN REDUCTION INTERNAL FIXATION (ORIF) ANKLE FRACTURE;  Surgeon: Cammy Copa, MD;  Location: [facility];  Service: Orthopedics;  Laterality: Left;   Social History   Occupational History   Occupation: [employer]  Tobacco Use    Smoking status: Former    Current packs/day: 0.00    Average packs/day: 0.3 packs/day for 8.0 years (2.0 ttl pk-yrs)    Types: Cigarettes    Start date: 05/09/1997    Quit date: 05/09/2005    Years since quitting: 17.7   Smokeless tobacco: Never   Tobacco comments:    Married, lives with spouse and son. 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  smoked socially  off and on until she became pregnant.  Vaping Use   Vaping status: Every Day  Substance and Sexual Activity   Alcohol use: Yes    Comment: social   Drug use: No   Sexual activity: Not on file

## 2023-02-21 ENCOUNTER — Other Ambulatory Visit: Payer: Self-pay | Admitting: Surgical

## 2023-02-27 ENCOUNTER — Ambulatory Visit (INDEPENDENT_AMBULATORY_CARE_PROVIDER_SITE_OTHER): Payer: No Typology Code available for payment source | Admitting: Orthopedic Surgery

## 2023-02-27 ENCOUNTER — Encounter: Payer: Self-pay | Admitting: Orthopedic Surgery

## 2023-02-27 ENCOUNTER — Other Ambulatory Visit (INDEPENDENT_AMBULATORY_CARE_PROVIDER_SITE_OTHER): Payer: No Typology Code available for payment source

## 2023-02-27 DIAGNOSIS — Z8781 Personal history of (healed) traumatic fracture: Secondary | ICD-10-CM

## 2023-02-27 DIAGNOSIS — Z9889 Other specified postprocedural states: Secondary | ICD-10-CM | POA: Diagnosis not present

## 2023-02-27 NOTE — Progress Notes (Unsigned)
Post-Op Visit Note   Patient: [name]           Date of Birth: [birth year]           MRN: [id number] Visit Date: 02/27/2023 PCP: Pincus Sanes, MD   Assessment & Plan:  Chief Complaint:  Chief Complaint  Patient presents with   Left Ankle - Routine Post Op    12/29/22 left ankle ORIF   Visit Diagnoses:  1. S/P ORIF (open reduction internal fixation) fracture     Plan: Patient underwent left ankle open reduction internal fixation 12/29/2022.  She is working doing mortgage work.  She walks with the brace.  On exam the incisions are intact.  Has about 15 degrees of dorsiflexion and 25 of plantarflexion.  Overall she is doing well.  Okay for her to come out of the brace.  Syndesmosis feels stable.  4-week return with Franky Macho with radiographs at that time and likely release.  Radiographs today look good.  Mortise is symmetric and fracture appears to be healing.  Follow-Up Instructions: No follow-ups on file.   Orders:  Orders Placed This Encounter  Procedures   XR Ankle Complete Left   No orders of the defined types were placed in this encounter.   Imaging: No results found.  PMFS History: Patient Active Problem List   Diagnosis Date Noted   Closed bimalleolar fracture of left ankle 01/08/2023   Episodic lightheadedness 02/23/2022   Acute pain of left knee 12/07/2021   Onychomycosis of toenail 06/08/2021   Right knee pain 11/30/2020   Migraines - Dr Lucia Gaskins 05/31/2020   Ventral hernia without obstruction or gangrene 11/14/2019   Hair loss 11/14/2019   Cyst of nasal sinus 06/11/2019   Deviated nasal septum 06/11/2019   Thyroid nodule 06/11/2019   Cervicogenic headache 05/17/2019   Ganglion cyst of wrist, left 05/29/2018   Hx of colonic polyp 09/20/2017   Anal skin tag 09/13/2017   Anxiety and depression 05/24/2017   Atrophic vaginitis 01/30/2017   Lateral epicondylitis 05/23/2016   Diabetes (HCC) 05/23/2016   Snoring 09/07/2015   Insomnia 06/02/2009    Morbid obesity (HCC) 05/19/2008   Hyperlipidemia 04/17/2008   Essential hypertension 04/17/2008   Allergic rhinitis 04/17/2008   Past Medical History:  Diagnosis Date   ALLERGIC RHINITIS    Anal skin tag 09/13/2017   Anemia    History of   Cataract    Bilateral   COMMON MIGRAINE    GLUCOSE INTOLERANCE    Hx of colonic polyp 09/20/2017   Hypercalcemia    HYPERLIPIDEMIA    HYPERTENSION    INSOMNIA    Migraines    OA (osteoarthritis)    OBESITY    RENAL INSUFFICIENCY     Family History  Problem Relation Age of Onset   Hypertension Mother    Dementia Mother    Other Father        MVA   Diabetes Maternal Grandfather    Asthma Brother    Rheum arthritis Maternal Aunt    Colon cancer Neg Hx    Stomach cancer Neg Hx    Rectal cancer Neg Hx     Past Surgical History:  Procedure Laterality Date   BREAST SURGERY  05/09/1996   Implants   cataract Bilateral    CESAREAN SECTION     hypertension post partum  09/07/2006   Not precalmpsia   ORIF ANKLE FRACTURE Left 12/29/2022   Procedure: OPEN REDUCTION INTERNAL FIXATION (ORIF) ANKLE FRACTURE;  Surgeon:  Cammy Copa, MD;  Location: [facility];  Service: Orthopedics;  Laterality: Left;   Social History   Occupational History   Occupation: [employer]  Tobacco Use   Smoking status: Former    Current packs/day: 0.00    Average packs/day: 0.3 packs/day for 8.0 years (2.0 ttl pk-yrs)    Types: Cigarettes    Start date: 05/09/1997    Quit date: 05/09/2005    Years since quitting: 17.8   Smokeless tobacco: Never   Tobacco comments:    Married, lives with spouse and son. 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  smoked socially  off and on until she became pregnant.  Vaping Use   Vaping status: Every Day  Substance and Sexual Activity   Alcohol use: Yes    Comment: social   Drug use: No   Sexual activity: Not on file

## 2023-02-28 ENCOUNTER — Other Ambulatory Visit: Payer: Self-pay | Admitting: Surgical

## 2023-02-28 ENCOUNTER — Telehealth: Payer: Self-pay | Admitting: Surgical

## 2023-02-28 NOTE — Telephone Encounter (Signed)
Called patient left message to return call to reschedule her appointment with Franky Macho per Dr. August Saucer   4 week post op   03/29/2023

## 2023-03-01 ENCOUNTER — Other Ambulatory Visit: Payer: Self-pay

## 2023-03-29 ENCOUNTER — Ambulatory Visit: Payer: No Typology Code available for payment source | Admitting: Orthopedic Surgery

## 2023-03-29 ENCOUNTER — Other Ambulatory Visit (INDEPENDENT_AMBULATORY_CARE_PROVIDER_SITE_OTHER): Payer: Self-pay

## 2023-03-29 DIAGNOSIS — Z8781 Personal history of (healed) traumatic fracture: Secondary | ICD-10-CM | POA: Diagnosis not present

## 2023-03-29 DIAGNOSIS — Z9889 Other specified postprocedural states: Secondary | ICD-10-CM

## 2023-03-31 ENCOUNTER — Encounter: Payer: Self-pay | Admitting: Orthopedic Surgery

## 2023-03-31 NOTE — Progress Notes (Unsigned)
Post-Op Visit Note   Patient: [name]           Date of Birth: Jul 16, 1965           MRN: [id number] Visit Date: 03/29/2023 PCP: Pincus Sanes, MD   Assessment & Plan:  Chief Complaint:  Chief Complaint  Patient presents with   Left Ankle - Routine Post Op    12/29/22 left ankle ORIF   Visit Diagnoses:  1. S/P ORIF (open reduction internal fixation) fracture     Plan: [name] is a 57 year old patient with left ankle fracture status post open reduction internal fixation 12/29/2022.  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  She is doing well.  On examination she has excellent range of motion and fairly minimal swelling.  Does have a small little blood blister on the medial incision.  Plan at this time is continue with stretching.  Fracture is healed well.  Currently not symptomatic.  Would not do any running or jumping through the end of the year and then activity as tolerated.  Follow-up as needed.  Follow-Up Instructions: No follow-ups on file.   Orders:  Orders Placed This Encounter  Procedures   XR Ankle Complete Left   No orders of the defined types were placed in this encounter.   Imaging: No results found.  PMFS History: Patient Active Problem List   Diagnosis Date Noted   Closed bimalleolar fracture of left ankle 01/08/2023   Episodic lightheadedness 02/23/2022   Acute pain of left knee 12/07/2021   Onychomycosis of toenail 06/08/2021   Right knee pain 11/30/2020   Migraines - Dr Lucia Gaskins 05/31/2020   Ventral hernia without obstruction or gangrene 11/14/2019   Hair loss 11/14/2019   Cyst of nasal sinus 06/11/2019   Deviated nasal septum 06/11/2019   Thyroid nodule 06/11/2019   Cervicogenic headache 05/17/2019   Ganglion cyst of wrist, left 05/29/2018   Hx of colonic polyp 09/20/2017   Anal skin tag 09/13/2017   Anxiety and depression 05/24/2017   Atrophic vaginitis 01/30/2017   Lateral epicondylitis 05/23/2016   Diabetes (HCC) 05/23/2016   Snoring  09/07/2015   Insomnia 06/02/2009   Morbid obesity (HCC) 05/19/2008   Hyperlipidemia 04/17/2008   Essential hypertension 04/17/2008   Allergic rhinitis 04/17/2008   Past Medical History:  Diagnosis Date   ALLERGIC RHINITIS    Anal skin tag 09/13/2017   Anemia    History of   Cataract    Bilateral   COMMON MIGRAINE    GLUCOSE INTOLERANCE    Hx of colonic polyp 09/20/2017   Hypercalcemia    HYPERLIPIDEMIA    HYPERTENSION    INSOMNIA    Migraines    OA (osteoarthritis)    OBESITY    RENAL INSUFFICIENCY     Family History  Problem Relation Age of Onset   Hypertension Mother    Dementia Mother    Other Father        MVA   Diabetes Maternal Grandfather    Asthma Brother    Rheum arthritis Maternal Aunt    Colon cancer Neg Hx    Stomach cancer Neg Hx    Rectal cancer Neg Hx     Past Surgical History:  Procedure Laterality Date   BREAST SURGERY  05/09/1996   Implants   cataract Bilateral    CESAREAN SECTION     hypertension post partum  09/07/2006   Not precalmpsia   ORIF ANKLE FRACTURE Left 12/29/2022   Procedure: OPEN REDUCTION INTERNAL  FIXATION (ORIF) ANKLE FRACTURE;  Surgeon: Cammy Copa, MD;  Location: [facility];  Service: Orthopedics;  Laterality: Left;   Social History   Occupational History   Occupation: [employer]  Tobacco Use   Smoking status: Former    Current packs/day: 0.00    Average packs/day: 0.3 packs/day for 8.0 years (2.0 ttl pk-yrs)    Types: Cigarettes    Start date: 05/09/1997    Quit date: 05/09/2005    Years since quitting: 17.9   Smokeless tobacco: Never   Tobacco comments:    Married, lives with spouse and son. 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  smoked socially  off and on until she became pregnant.  Vaping Use   Vaping status: Every Day  Substance and Sexual Activity   Alcohol use: Yes    Comment: social   Drug use: No   Sexual activity: Not on file

## 2023-04-02 ENCOUNTER — Other Ambulatory Visit: Payer: Self-pay | Admitting: Internal Medicine

## 2023-04-15 ENCOUNTER — Other Ambulatory Visit: Payer: Self-pay | Admitting: Internal Medicine

## 2023-04-15 DIAGNOSIS — I10 Essential (primary) hypertension: Secondary | ICD-10-CM

## 2023-06-15 ENCOUNTER — Encounter: Payer: Self-pay | Admitting: Internal Medicine

## 2023-06-15 NOTE — Progress Notes (Signed)
 Subjective:    Patient ID: [name], female    DOB: 1965/10/07, 58 y.o.   MRN: [id number]      HPI [name] is here for a Physical exam and her chronic medical problems.   Her mother died a couple of months ago.  She is doing okay overall, but and has been stressful.  Work is also been stressful.  She is not sleeping great, which is chronic.  She does feel tired.   Medications and allergies reviewed with patient and updated if appropriate.  Current Outpatient Medications on File Prior to Visit  Medication Sig Dispense Refill   amLODipine  (NORVASC ) 5 MG tablet TAKE 1 TABLET BY MOUTH EVERY DAY 90 tablet 1   atorvastatin  (LIPITOR) 40 MG tablet Take 1 tablet (40 mg total) by mouth daily. 90 tablet 3   Azelastine  HCl 137 MCG/SPRAY SOLN PLACE 1-2 SPRAYS INTO BOTH NOSTRILS 2 (TWO) TIMES DAILY. USE IN EACH NOSTRIL AS DIRECTED 90 mL 3   [facility] ASPIRIN  ADULT LOW DOSE 81 MG chewable tablet CHEW 1 TABLET (81 MG TOTAL) BY MOUTH 2 (TWO) TIMES DAILY. TO PREVENT BLOOD CLOTS 60 tablet 0   fluticasone (FLONASE) 50 MCG/ACT nasal spray Place 2 sprays into both nostrils 2 (two) times daily as needed for allergies.     glucose blood (ONETOUCH VERIO) test strip Use as instructed to check sugar daily 100 strip 3   glucose blood test strip OneTouch Verio test strips  TEST 4X DAILY     icosapent  Ethyl (VASCEPA ) 1 g capsule Take 2 capsules (2 g total) by mouth 2 (two) times daily. 120 capsule 5   ipratropium-albuterol  (DUONEB) 0.5-2.5 (3) MG/3ML SOLN Take 3 mLs by nebulization every 6 (six) hours as needed. 360 mL 0   labetalol  (NORMODYNE ) 200 MG tablet TAKE TWO TABLETS BY MOUTH THREE TIMES DAILY 540 tablet 1   metFORMIN  (GLUCOPHAGE ) 500 MG tablet Take 1 tablet (500 mg total) by mouth 2 (two) times daily with a meal. 180 tablet 1   RESTASIS 0.05 % ophthalmic emulsion INSTILL 1 DROP INTO BOTH EYES TWICE A DAY INSTILL 1 DROP INTO BOTH EYES TWICE DAILY     triamterene -hydrochlorothiazide (DYAZIDE)  37.5-25 MG capsule Take 1 each (1 capsule total) by mouth daily. 90 capsule 2   No current facility-administered medications on file prior to visit.    Review of Systems  Constitutional:  Positive for fatigue. Negative for fever.  HENT:  Positive for postnasal drip.   Eyes:  Negative for visual disturbance.  Respiratory:  Negative for cough, shortness of breath and wheezing.   Cardiovascular:  Positive for palpitations (occ). Negative for chest pain and leg swelling.  Gastrointestinal:  Negative for abdominal pain, blood in stool, constipation and diarrhea.       No gerd  Genitourinary:  Negative for dysuria.  Musculoskeletal:  Negative for arthralgias and back pain.  Skin:  Negative for rash.  Neurological:  Positive for dizziness (occ), light-headedness and headaches (occ).  Psychiatric/Behavioral:  Positive for dysphoric mood (some days) and sleep disturbance. The patient is nervous/anxious.        Objective:   Vitals:   06/16/23 0829  BP: 120/84  Pulse: 80  Temp: 98.2 F (36.8 C)  SpO2: 99%   Filed Weights   06/16/23 0829  Weight: 162 lb (73.5 kg)   Body mass index is 27.81 kg/m.  BP Readings from Last 3 Encounters:  06/16/23 120/84  12/29/22 (!) 129/91  12/27/22 124/82  Wt Readings from Last 3 Encounters:  06/16/23 162 lb (73.5 kg)  12/29/22 198 lb (89.8 kg)  12/27/22 198 lb (89.8 kg)       Physical Exam Constitutional: She appears well-developed and well-nourished. No distress.  HENT:  Head: Normocephalic and atraumatic.  Right Ear: External ear normal. Normal ear canal and TM Left Ear: External ear normal.  Normal ear canal and TM Mouth/Throat: Oropharynx is clear and moist.  Eyes: Conjunctivae normal.  Neck: Neck supple. No tracheal deviation present. No thyromegaly present.  No carotid bruit  Cardiovascular: Normal rate, regular rhythm and normal heart sounds.   No murmur heard.  No edema. Pulmonary/Chest: Effort normal and breath sounds  normal. No respiratory distress. She has no wheezes. She has no rales.  Breast: deferred   Abdominal: Soft. She exhibits no distension. There is no tenderness.  Lymphadenopathy: She has no cervical adenopathy.  Skin: Skin is warm and dry. She is not diaphoretic.  Psychiatric: She has a normal mood and affect. Her behavior is normal.     Lab Results  Component Value Date   WBC 6.2 12/09/2022   HGB 15.0 12/09/2022   HCT 44.2 12/09/2022   PLT 253.0 12/09/2022   GLUCOSE 112 (H) 12/09/2022   CHOL 145 12/09/2022   TRIG 190.0 (H) 12/09/2022   HDL 40.10 12/09/2022   LDLDIRECT 81.0 06/10/2022   LDLCALC 66 12/09/2022   ALT 62 (H) 12/09/2022   AST 45 (H) 12/09/2022   NA 140 12/09/2022   K 3.4 (L) 12/09/2022   CL 101 12/09/2022   CREATININE 0.89 12/09/2022   BUN 13 12/09/2022   CO2 29 12/09/2022   TSH 2.12 06/10/2022   HGBA1C 6.0 12/09/2022   MICROALBUR <0.7 12/09/2022         Assessment & Plan:   Physical exam: Screening blood work  ordered Exercise  none - stressed regular exercise  Weight  is good - has lost weight with mounjaro  Substance abuse  none   Reviewed recommended immunizations.   Health Maintenance  Topic Date Due   Cervical Cancer Screening (HPV/Pap Cotest)  05/10/2011   FOOT EXAM  06/08/2022   Colonoscopy  09/14/2022   OPHTHALMOLOGY EXAM  02/22/2023   HEMOGLOBIN A1C  06/11/2023   COVID-19 Vaccine (1) 07/02/2023 (Originally 11/15/1970)   INFLUENZA VACCINE  08/07/2023 (Originally 12/08/2022)   Pneumococcal Vaccine 105-62 Years old (1 of 2 - PCV) 06/15/2024 (Originally 11/14/1972)   MAMMOGRAM  07/20/2023   Diabetic kidney evaluation - eGFR measurement  12/09/2023   Diabetic kidney evaluation - Urine ACR  12/09/2023   DTaP/Tdap/Td (4 - Td or Tdap) 11/25/2031   Hepatitis C Screening  Completed   HIV Screening  Completed   Zoster Vaccines- Shingrix   Completed   HPV VACCINES  Aged Out          See Problem List for Assessment and Plan of chronic medical  problems.

## 2023-06-15 NOTE — Patient Instructions (Addendum)
 Blood work was ordered.       Medications changes include :   increase lexapro  to 20 mg daily   GI will call you for your colonoscopy   Return in about 6 months (around 12/14/2023) for follow up.   Health Maintenance, Female Adopting a healthy lifestyle and getting preventive care are important in promoting health and wellness. Ask your health care provider about: The right schedule for you to have regular tests and exams. Things you can do on your own to prevent diseases and keep yourself healthy. What should I know about diet, weight, and exercise? Eat a healthy diet  Eat a diet that includes plenty of vegetables, fruits, low-fat dairy products, and lean protein. Do not eat a lot of foods that are high in solid fats, added sugars, or sodium. Maintain a healthy weight Body mass index (BMI) is used to identify weight problems. It estimates body fat based on height and weight. Your health care provider can help determine your BMI and help you achieve or maintain a healthy weight. Get regular exercise Get regular exercise. This is one of the most important things you can do for your health. Most adults should: Exercise for at least 150 minutes each week. The exercise should increase your heart rate and make you sweat (moderate-intensity exercise). Do strengthening exercises at least twice a week. This is in addition to the moderate-intensity exercise. Spend less time sitting. Even light physical activity can be beneficial. Watch cholesterol and blood lipids Have your blood tested for lipids and cholesterol at 57 years of age, then have this test every 5 years. Have your cholesterol levels checked more often if: Your lipid or cholesterol levels are high. You are older than 57 years of age. You are at high risk for heart disease. What should I know about cancer screening? Depending on your health history and family history, you may need to have cancer screening at various  ages. This may include screening for: Breast cancer. Cervical cancer. Colorectal cancer. Skin cancer. Lung cancer. What should I know about heart disease, diabetes, and high blood pressure? Blood pressure and heart disease High blood pressure causes heart disease and increases the risk of stroke. This is more likely to develop in people who have high blood pressure readings or are overweight. Have your blood pressure checked: Every 3-5 years if you are 106-56 years of age. Every year if you are 65 years old or older. Diabetes Have regular diabetes screenings. This checks your fasting blood sugar level. Have the screening done: Once every three years after age 68 if you are at a normal weight and have a low risk for diabetes. More often and at a younger age if you are overweight or have a high risk for diabetes. What should I know about preventing infection? Hepatitis B If you have a higher risk for hepatitis B, you should be screened for this virus. Talk with your health care provider to find out if you are at risk for hepatitis B infection. Hepatitis C Testing is recommended for: Everyone born from [birth year] through 1964. Anyone with known risk factors for hepatitis C. Sexually transmitted infections (STIs) Get screened for STIs, including gonorrhea and chlamydia, if: You are sexually active and are younger than 57 years of age. You are older than 57 years of age and your health care provider tells you that you are at risk for this type of infection. Your sexual activity has changed since you were  last screened, and you are at increased risk for chlamydia or gonorrhea. Ask your health care provider if you are at risk. Ask your health care provider about whether you are at high risk for HIV. Your health care provider may recommend a prescription medicine to help prevent HIV infection. If you choose to take medicine to prevent HIV, you should first get tested for HIV. You should then be tested  every 3 months for as long as you are taking the medicine. Pregnancy If you are about to stop having your period (premenopausal) and you may become pregnant, seek counseling before you get pregnant. Take 400 to 800 micrograms (mcg) of folic acid every day if you become pregnant. Ask for birth control (contraception) if you want to prevent pregnancy. Osteoporosis and menopause Osteoporosis is a disease in which the bones lose minerals and strength with aging. This can result in bone fractures. If you are 59 years old or older, or if you are at risk for osteoporosis and fractures, ask your health care provider if you should: Be screened for bone loss. Take a calcium  or vitamin D  supplement to lower your risk of fractures. Be given hormone replacement therapy (HRT) to treat symptoms of menopause. Follow these instructions at home: Alcohol use Do not drink alcohol if: Your health care provider tells you not to drink. You are pregnant, may be pregnant, or are planning to become pregnant. If you drink alcohol: Limit how much you have to: 0-1 drink a day. Know how much alcohol is in your drink. In the [place] one drink equals one 12 oz bottle of beer (355 mL), one 5 oz glass of wine (148 mL), or one 1 oz glass of hard liquor (44 mL). Lifestyle Do not use any products that contain nicotine or tobacco. These products include cigarettes, chewing tobacco, and vaping devices, such as e-cigarettes. If you need help quitting, ask your health care provider. Do not use street drugs. Do not share needles. Ask your health care provider for help if you need support or information about quitting drugs. General instructions Schedule regular health, dental, and eye exams. Stay current with your vaccines. Tell your health care provider if: You often feel depressed. You have ever been abused or do not feel safe at home. Summary Adopting a healthy lifestyle and getting preventive care are important in promoting  health and wellness. Follow your health care provider's instructions about healthy diet, exercising, and getting tested or screened for diseases. Follow your health care provider's instructions on monitoring your cholesterol and blood pressure. This information is not intended to replace advice given to you by your health care provider. Make sure you discuss any questions you have with your health care provider. Document Revised: 09/14/2020 Document Reviewed: 09/14/2020 Elsevier Patient Education  2024 [employer].

## 2023-06-16 ENCOUNTER — Ambulatory Visit (INDEPENDENT_AMBULATORY_CARE_PROVIDER_SITE_OTHER): Payer: No Typology Code available for payment source | Admitting: Internal Medicine

## 2023-06-16 VITALS — BP 120/84 | HR 80 | Temp 98.2°F | Ht 64.0 in | Wt 162.0 lb

## 2023-06-16 DIAGNOSIS — E1165 Type 2 diabetes mellitus with hyperglycemia: Secondary | ICD-10-CM | POA: Diagnosis not present

## 2023-06-16 DIAGNOSIS — Z0000 Encounter for general adult medical examination without abnormal findings: Secondary | ICD-10-CM

## 2023-06-16 DIAGNOSIS — E7849 Other hyperlipidemia: Secondary | ICD-10-CM | POA: Diagnosis not present

## 2023-06-16 DIAGNOSIS — Z79899 Other long term (current) drug therapy: Secondary | ICD-10-CM | POA: Diagnosis not present

## 2023-06-16 DIAGNOSIS — F32A Depression, unspecified: Secondary | ICD-10-CM | POA: Diagnosis not present

## 2023-06-16 DIAGNOSIS — F419 Anxiety disorder, unspecified: Secondary | ICD-10-CM | POA: Diagnosis not present

## 2023-06-16 DIAGNOSIS — G4709 Other insomnia: Secondary | ICD-10-CM

## 2023-06-16 DIAGNOSIS — Z860100 Personal history of colon polyps, unspecified: Secondary | ICD-10-CM

## 2023-06-16 DIAGNOSIS — Z7984 Long term (current) use of oral hypoglycemic drugs: Secondary | ICD-10-CM | POA: Diagnosis not present

## 2023-06-16 DIAGNOSIS — I10 Essential (primary) hypertension: Secondary | ICD-10-CM | POA: Diagnosis not present

## 2023-06-16 LAB — COMPREHENSIVE METABOLIC PANEL
ALT: 22 U/L (ref 0–35)
AST: 22 U/L (ref 0–37)
Albumin: 4.5 g/dL (ref 3.5–5.2)
Alkaline Phosphatase: 72 U/L (ref 39–117)
BUN: 9 mg/dL (ref 6–23)
CO2: 29 meq/L (ref 19–32)
Calcium: 10 mg/dL (ref 8.4–10.5)
Chloride: 98 meq/L (ref 96–112)
Creatinine, Ser: 0.99 mg/dL (ref 0.40–1.20)
GFR: 63.71 mL/min (ref >60.00)
Glucose, Bld: 89 mg/dL (ref 70–99)
Potassium: 3.3 meq/L — ABNORMAL LOW (ref 3.5–5.1)
Sodium: 141 meq/L (ref 135–145)
Total Bilirubin: 0.9 mg/dL (ref 0.2–1.2)
Total Protein: 7.4 g/dL (ref 6.0–8.3)

## 2023-06-16 LAB — LIPID PANEL
Cholesterol: 160 mg/dL (ref 0–200)
HDL: 43.3 mg/dL (ref >39.00)
LDL Cholesterol: 83 mg/dL (ref 0–99)
NonHDL: 117.18
Total CHOL/HDL Ratio: 4
Triglycerides: 169 mg/dL — ABNORMAL HIGH (ref 0.0–149.0)
VLDL: 33.8 mg/dL (ref 0.0–40.0)

## 2023-06-16 LAB — CBC WITH DIFFERENTIAL/PLATELET
Basophils Absolute: 0.1 10*3/uL (ref 0.0–0.1)
Basophils Relative: 1.2 % (ref 0.0–3.0)
Eosinophils Absolute: 0.1 10*3/uL (ref 0.0–0.7)
Eosinophils Relative: 1.7 % (ref 0.0–5.0)
HCT: 44.1 % (ref 36.0–46.0)
Hemoglobin: 15.2 g/dL — ABNORMAL HIGH (ref 12.0–15.0)
Lymphocytes Relative: 33.1 % (ref 12.0–46.0)
Lymphs Abs: 2.2 10*3/uL (ref 0.7–4.0)
MCHC: 34.5 g/dL (ref 30.0–36.0)
MCV: 89.9 fL (ref 78.0–100.0)
Monocytes Absolute: 0.7 10*3/uL (ref 0.1–1.0)
Monocytes Relative: 10 % (ref 3.0–12.0)
Neutro Abs: 3.6 10*3/uL (ref 1.4–7.7)
Neutrophils Relative %: 54 % (ref 43.0–77.0)
Platelets: 292 10*3/uL (ref 150.0–400.0)
RBC: 4.91 Mil/uL (ref 3.87–5.11)
RDW: 12.6 % (ref 11.5–15.5)
WBC: 6.7 10*3/uL (ref 4.0–10.5)

## 2023-06-16 LAB — TSH: TSH: 0.53 u[IU]/mL (ref 0.35–5.50)

## 2023-06-16 LAB — HEMOGLOBIN A1C: Hgb A1c MFr Bld: 5.2 % (ref 4.6–6.5)

## 2023-06-16 LAB — VITAMIN B12: Vitamin B-12: 460 pg/mL (ref 211–911)

## 2023-06-16 MED ORDER — MOUNJARO 5 MG/0.5ML ~~LOC~~ SOAJ: 5.0000 mg | SUBCUTANEOUS | 1 refills | Status: DC

## 2023-06-16 MED ORDER — ESCITALOPRAM OXALATE 20 MG PO TABS: 20.0000 mg | ORAL_TABLET | Freq: Every day | ORAL | 1 refills | Status: DC

## 2023-06-16 NOTE — Assessment & Plan Note (Signed)
 Chronic  Lab Results  Component Value Date   HGBA1C 6.0 12/09/2022   Sugars well controlled Check A1c Continue metformin  500 mg twice daily Continue Mounjaro  5 mg weekly Stressed regular exercise, diabetic diet

## 2023-06-16 NOTE — Assessment & Plan Note (Addendum)
 Chronic Not ideally controlled-having increased stress with her mother's recent death and with work Not taking buspirone  Increase Lexapro  to 20 mg daily-discussed we can keep it at a higher dose for a few months and then always decrease depending on how she is doing

## 2023-06-16 NOTE — Assessment & Plan Note (Signed)
Due for colonoscopy - referral ordered ?

## 2023-06-16 NOTE — Assessment & Plan Note (Addendum)
 Chronic She has lost a significant amount of weight with Mounjaro  Encouraged healthy diet, decrease portions Continue metformin , mounjaro  Stressed to eating good amount of protein which she thinks she is doing Encouraged regular exercise

## 2023-06-16 NOTE — Assessment & Plan Note (Signed)
Chronic Blood pressure well controlled CMP, CBC Continue amlodipine 5 mg daily, labetalol 400 mg 3 times daily, triamterene-HCTZ 37.5-25 mg daily

## 2023-06-16 NOTE — Assessment & Plan Note (Addendum)
 Chronic Regular exercise and healthy diet encouraged Working on weight loss Check lipid panel, CMP Continue atorvastatin  40 mg daily, Vascepa  2 g twice daily

## 2023-06-16 NOTE — Assessment & Plan Note (Addendum)
 Chronic Overall sleep is poor Trazodone , sonata , lunesta  not effective  Related to stress which is high Stressed trying to get to bed earlier if possible to maximize sleep Encouraged regular exercise which may help Work on stress management-will start using essential oils which helped her in the past Will increase Lexapro  to help with anxiety Deferred prescription medication

## 2023-06-16 NOTE — Assessment & Plan Note (Signed)
 Chronic use of metformin  Check B12 level

## 2023-06-18 ENCOUNTER — Encounter: Payer: Self-pay | Admitting: Internal Medicine

## 2023-06-21 ENCOUNTER — Other Ambulatory Visit: Payer: Self-pay | Admitting: Internal Medicine

## 2023-08-17 ENCOUNTER — Other Ambulatory Visit: Payer: Self-pay

## 2023-08-17 ENCOUNTER — Ambulatory Visit (AMBULATORY_SURGERY_CENTER)

## 2023-08-17 VITALS — Ht 64.0 in | Wt 160.0 lb

## 2023-08-17 DIAGNOSIS — Z860100 Personal history of colon polyps, unspecified: Secondary | ICD-10-CM

## 2023-08-17 MED ORDER — POLYETHYLENE GLYCOL 3350 17 GM/SCOOP PO POWD: 238.0000 g | Freq: Every day | ORAL | 3 refills | Status: AC

## 2023-08-17 NOTE — Progress Notes (Signed)
 Denies allergies to eggs or soy products. Denies complication of anesthesia or sedation. Denies use of weight loss medication. Denies use of O2.   Emmi instructions given for colonoscopy.

## 2023-08-29 ENCOUNTER — Encounter: Payer: Self-pay | Admitting: Internal Medicine

## 2023-09-01 ENCOUNTER — Ambulatory Visit: Admitting: Internal Medicine

## 2023-09-01 ENCOUNTER — Encounter: Payer: Self-pay | Admitting: Internal Medicine

## 2023-09-01 VITALS — BP 123/80 | HR 76 | Temp 98.0°F | Resp 16 | Ht 64.0 in | Wt 160.0 lb

## 2023-09-01 DIAGNOSIS — K644 Residual hemorrhoidal skin tags: Secondary | ICD-10-CM | POA: Diagnosis not present

## 2023-09-01 DIAGNOSIS — K621 Rectal polyp: Secondary | ICD-10-CM | POA: Diagnosis not present

## 2023-09-01 DIAGNOSIS — K5730 Diverticulosis of large intestine without perforation or abscess without bleeding: Secondary | ICD-10-CM | POA: Diagnosis not present

## 2023-09-01 DIAGNOSIS — K635 Polyp of colon: Secondary | ICD-10-CM

## 2023-09-01 DIAGNOSIS — Z1211 Encounter for screening for malignant neoplasm of colon: Secondary | ICD-10-CM

## 2023-09-01 DIAGNOSIS — K639 Disease of intestine, unspecified: Secondary | ICD-10-CM

## 2023-09-01 DIAGNOSIS — D128 Benign neoplasm of rectum: Secondary | ICD-10-CM

## 2023-09-01 DIAGNOSIS — K6389 Other specified diseases of intestine: Secondary | ICD-10-CM

## 2023-09-01 DIAGNOSIS — Z860100 Personal history of colon polyps, unspecified: Secondary | ICD-10-CM

## 2023-09-01 DIAGNOSIS — D120 Benign neoplasm of cecum: Secondary | ICD-10-CM

## 2023-09-01 MED ORDER — SODIUM CHLORIDE 0.9 % IV SOLN: 500.0000 mL | INTRAVENOUS | Status: DC

## 2023-09-01 NOTE — Op Note (Signed)
 [facility] Patient Name: [name] Procedure Date: 09/01/2023 2:27 PM MRN: [id number] Endoscopist: Kenney Peacemaker , MD, [id number] Age: 58 Referring MD:  Date of Birth: December 01, 1965 Gender: Female Account #: [id number] Procedure:                Colonoscopy Indications:              High risk colon cancer surveillance: Personal                            history of sessile serrated colon polyp (less than                            10 mm in size) with no dysplasia, Last colonoscopy:                            2019 Medicines:                Monitored Anesthesia Care Procedure:                Pre-Anesthesia Assessment:                           - Prior to the procedure, a History and Physical                            was performed, and patient medications and                            allergies were reviewed. The patient's tolerance of                            previous anesthesia was also reviewed. The risks                            and benefits of the procedure and the sedation                            options and risks were discussed with the patient.                            All questions were answered, and informed consent                            was obtained. Prior Anticoagulants: The patient has                            taken no anticoagulant or antiplatelet agents. ASA                            Grade Assessment: III - A patient with severe                            systemic disease. After reviewing the risks and  benefits, the patient was deemed in satisfactory                            condition to undergo the procedure.                           After obtaining informed consent, the colonoscope                            was passed under direct vision. Throughout the                            procedure, the patient's blood pressure, pulse, and                            oxygen saturations were monitored continuously.  The                            Olympus Scope SN: [id number] was introduced through                            the anus and advanced to the the cecum, identified                            by appendiceal orifice and ileocecal valve. The                            colonoscopy was performed without difficulty. The                            patient tolerated the procedure well. The quality                            of the bowel preparation was good. The ileocecal                            valve, appendiceal orifice, and rectum were                            photographed. The bowel preparation used was                            Miralax  via split dose instruction. Scope In: 2:42:09 PM Scope Out: 3:12:55 PM Scope Withdrawal Time: 0 hours 25 minutes 28 seconds  Total Procedure Duration: 0 hours 30 minutes 46 seconds  Findings:                 Hemorrhoids were found on perianal exam.                           A 5 mm polyp was found in the cecum. The polyp was                            sessile. The polyp was removed with a  cold snare.                            Resection and retrieval were complete. Verification                            of patient identification for the specimen was                            done. Estimated blood loss was minimal.                           A localized area of moderately congested,                            erythematous and inflamed mucosa was found in the                            transverse colon. Biopsies were taken with a cold                            forceps for histology. Verification of patient                            identification for the specimen was done. Estimated                            blood loss was minimal.                           A 4 mm polyp was found in the proximal rectum. The                            polyp was sessile. The polyp was removed with a                            cold snare. Resection was complete, but the polyp                             tissue was not retrieved. Verification of patient                            identification for the specimen was done. Estimated                            blood loss was minimal.                           Multiple diverticula were found in the entire colon.                           The exam was otherwise without abnormality on  direct and retroflexion views. Complications:            No immediate complications. Estimated Blood Loss:     Estimated blood loss was minimal. Impression:               - Hemorrhoids found on perianal exam.                           - One 5 mm polyp in the cecum, removed with a cold                            snare. Resected and retrieved.                           - Congested, erythematous and inflamed mucosa in                            the transverse colon. Biopsied. Could be focal                            diverticulitis                           - One 4 mm polyp in the proximal rectum, removed                            with a cold snare. Complete resection. Polyp tissue                            not retrieved.                           - Diverticulosis in the entire examined colon.                           - The examination was otherwise normal on direct                            and retroflexion views.                           - Personal history of colonic polyp diminutive ssp                            2019. Recommendation:           - Patient has a contact number available for                            emergencies. The signs and symptoms of potential                            delayed complications were discussed with the                            patient. Return to normal activities tomorrow.  Written discharge instructions were provided to the                            patient.                           - Resume previous diet.                           - Continue  present medications.                           - Await pathology results.                           - Repeat colonoscopy is recommended for                            surveillance. The colonoscopy date will be                            determined after pathology results from today's                            exam become available for review.                           - Patient advised if having abdominal pain to                            contact us  - would be inclined to treat for                            suspected diverticulitis if symptomatic Kenney Peacemaker, MD 09/01/2023 3:32:19 PM This report has been signed electronically.

## 2023-09-01 NOTE — Progress Notes (Signed)
 Patient states there have been no changes to medical or surgical history since time of pre-visit.

## 2023-09-01 NOTE — Progress Notes (Signed)
 [facility] History and Physical   Primary Care Physician:  Colene Dauphin, MD   Reason for Procedure:  History of colon polyps  Plan:    Colonoscopy     HPI: [name] is a 58 y.o. female presents for surveillance colonoscopy.  2019 exam revealed a diminutive sessile serrated polyp that was removed and also had sigmoid diverticulosis and a distal hyperplastic polyp.   Past Medical History:  Diagnosis Date   ALLERGIC RHINITIS    Allergy    Anal skin tag 09/13/2017   Anemia    History of   Anxiety    Cataract    Bilateral   COMMON MIGRAINE    Diabetes mellitus without complication (HCC)    GLUCOSE INTOLERANCE    Hx of colonic polyp 09/20/2017   Hypercalcemia    HYPERLIPIDEMIA    HYPERTENSION    INSOMNIA    Migraines    OA (osteoarthritis)    OBESITY    RENAL INSUFFICIENCY     Past Surgical History:  Procedure Laterality Date   BREAST SURGERY  05/09/1996   Implants   cataract Bilateral    CESAREAN SECTION     hypertension post partum  09/07/2006   Not precalmpsia   ORIF ANKLE FRACTURE Left 12/29/2022   Procedure: OPEN REDUCTION INTERNAL FIXATION (ORIF) ANKLE FRACTURE;  Surgeon: Jasmine Mesi, MD;  Location: [facility];  Service: Orthopedics;  Laterality: Left;    Prior to Admission medications   Medication Sig Start Date End Date Taking? Authorizing Provider  amLODipine  (NORVASC ) 5 MG tablet TAKE 1 TABLET BY MOUTH EVERY DAY 04/17/23   Burns, Beckey Bourgeois, MD  atorvastatin  (LIPITOR) 40 MG tablet Take 1 tablet (40 mg total) by mouth daily. 12/09/22   Colene Dauphin, MD  Azelastine  HCl 137 MCG/SPRAY SOLN PLACE 1-2 SPRAYS INTO BOTH NOSTRILS 2 (TWO) TIMES DAILY. USE IN EACH NOSTRIL AS DIRECTED Patient not taking: Reported on 08/17/2023 01/04/23   Colene Dauphin, MD  escitalopram  (LEXAPRO ) 20 MG tablet Take 1 tablet (20 mg total) by mouth daily. 06/16/23   Colene Dauphin, MD  fluticasone (FLONASE) 50 MCG/ACT nasal spray Place 2 sprays into both nostrils 2  (two) times daily as needed for allergies.    [provider]  glucose blood (ONETOUCH VERIO) test strip Use as instructed to check sugar daily 01/20/20   Colene Dauphin, MD  glucose blood test strip OneTouch Verio test strips  TEST 4X DAILY    [provider]  ipratropium-albuterol  (DUONEB) 0.5-2.5 (3) MG/3ML SOLN Take 3 mLs by nebulization every 6 (six) hours as needed. Patient not taking: Reported on 08/17/2023 02/12/22   Arcadio Knuckles, MD  labetalol  (NORMODYNE ) 200 MG tablet TAKE TWO TABLETS BY MOUTH THREE TIMES DAILY 12/07/21   Colene Dauphin, MD  metFORMIN  (GLUCOPHAGE ) 500 MG tablet TAKE 1 TABLET BY MOUTH 2 TIMES DAILY WITH A MEAL. 06/21/23   Colene Dauphin, MD  polyethylene glycol powder (GLYCOLAX /MIRALAX ) 17 GM/SCOOP powder Take 238 g by mouth daily. 08/17/23   Kenney Peacemaker, MD  RESTASIS 0.05 % ophthalmic emulsion INSTILL 1 DROP INTO BOTH EYES TWICE A DAY INSTILL 1 DROP INTO BOTH EYES TWICE DAILY 01/03/19   [provider]  tirzepatide  (MOUNJARO ) 5 MG/0.5ML Pen Inject 5 mg into the skin once a week. 06/16/23   Colene Dauphin, MD  triamterene -hydrochlorothiazide (DYAZIDE) 37.5-25 MG capsule Take 1 each (1 capsule total) by mouth daily. 12/07/21   Colene Dauphin, MD  Current Outpatient Medications  Medication Sig Dispense Refill   amLODipine  (NORVASC ) 5 MG tablet TAKE 1 TABLET BY MOUTH EVERY DAY 90 tablet 1   atorvastatin  (LIPITOR) 40 MG tablet Take 1 tablet (40 mg total) by mouth daily. 90 tablet 3   escitalopram  (LEXAPRO ) 20 MG tablet Take 1 tablet (20 mg total) by mouth daily. 90 tablet 1   fluticasone (FLONASE) 50 MCG/ACT nasal spray Place 2 sprays into both nostrils 2 (two) times daily as needed for allergies.     labetalol  (NORMODYNE ) 200 MG tablet TAKE TWO TABLETS BY MOUTH THREE TIMES DAILY 540 tablet 1   metFORMIN  (GLUCOPHAGE ) 500 MG tablet TAKE 1 TABLET BY MOUTH 2 TIMES DAILY WITH A MEAL. 180 tablet 1   polyethylene glycol powder (GLYCOLAX /MIRALAX ) 17  GM/SCOOP powder Take 238 g by mouth daily. 255 g 3   RESTASIS 0.05 % ophthalmic emulsion INSTILL 1 DROP INTO BOTH EYES TWICE A DAY INSTILL 1 DROP INTO BOTH EYES TWICE DAILY     triamterene -hydrochlorothiazide (DYAZIDE) 37.5-25 MG capsule Take 1 each (1 capsule total) by mouth daily. 90 capsule 2   Azelastine  HCl 137 MCG/SPRAY SOLN PLACE 1-2 SPRAYS INTO BOTH NOSTRILS 2 (TWO) TIMES DAILY. USE IN EACH NOSTRIL AS DIRECTED (Patient not taking: Reported on 08/17/2023) 90 mL 3   glucose blood (ONETOUCH VERIO) test strip Use as instructed to check sugar daily 100 strip 3   glucose blood test strip OneTouch Verio test strips  TEST 4X DAILY     ipratropium-albuterol  (DUONEB) 0.5-2.5 (3) MG/3ML SOLN Take 3 mLs by nebulization every 6 (six) hours as needed. (Patient not taking: Reported on 08/17/2023) 360 mL 0   tirzepatide  (MOUNJARO ) 5 MG/0.5ML Pen Inject 5 mg into the skin once a week. 6 mL 1   Current Facility-Administered Medications  Medication Dose Route Frequency Provider Last Rate Last Admin   0.9 %  sodium chloride  infusion  500 mL Intravenous Continuous Kenney Peacemaker, MD        Allergies as of 09/01/2023 - Review Complete 09/01/2023  Allergen Reaction Noted   Lisinopril Other (See Comments) 09/02/2008   Olmesartan medoxomil Other (See Comments) 09/16/2008    Family History  Problem Relation Age of Onset   Hypertension Mother    Dementia Mother    Other Father        MVA   Asthma Brother    Rheum arthritis Maternal Aunt    Diabetes Maternal Grandfather    Colon cancer Neg Hx    Stomach cancer Neg Hx    Rectal cancer Neg Hx    Esophageal cancer Neg Hx     Social History   Socioeconomic History   Marital status: Married    Spouse name: Johnathon   Number of children: 1   Years of education: 14   Highest education level: Not on file  Occupational History   Occupation: [employer]  Tobacco Use   Smoking status: Former    Current packs/day: 0.00    Average packs/day: 0.3 packs/day  for 8.0 years (2.0 ttl pk-yrs)    Types: Cigarettes    Start date: 05/09/1997    Quit date: 05/09/2005    Years since quitting: 18.3   Smokeless tobacco: Never   Tobacco comments:    Married, lives with spouse and son. 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  smoked socially  off and on until she became pregnant.  Vaping Use   Vaping status: Every Day   Substances:  Nicotine  Substance and Sexual Activity   Alcohol use: Yes    Comment: social   Drug use: No   Sexual activity: Not on file  Other Topics Concern   Not on file  Social History Narrative   Right handed    Coffee daily   Lives with husband and son. She also takes care of her mother who has Alzheimers   Social Drivers of Corporate investment banker Strain: Not on file  Food Insecurity: Not on file  Transportation Needs: Not on file  Physical Activity: Not on file  Stress: Not on file (03/15/2023)  Social Connections: Not on file  Intimate Partner Violence: Low Risk  (08/23/2019)   Received from [facility]   Intimate Partner Violence    Insults You: Not on file    Threatens You: Not on file    Screams at You: Not on file    Physically Hurt: Not on file    Intimate Partner Violence Score: Not on file    Review of Systems:  All other review of systems negative except as mentioned in the HPI.  Physical Exam: Vital signs BP (!) 140/94   Pulse 81   Temp 98 F (36.7 C) (Skin)   Ht 5\' 4"  (1.626 m)   Wt 160 lb (72.6 kg)   SpO2 97%   BMI 27.46 kg/m   General:   Alert,  Well-developed, well-nourished, pleasant and cooperative in NAD Lungs:  Clear throughout to auscultation.   Heart:  Regular rate and rhythm; no murmurs, clicks, rubs,  or gallops. Abdomen:  Soft, nontender and nondistended. Normal bowel sounds.   Neuro/Psych:  Alert and cooperative. Normal mood and affect. A and O x 3   @Taylah Dubiel  Tammie Fall, MD, [facility] [phone]  (pager) 09/01/2023 2:25 PM@

## 2023-09-01 NOTE — Patient Instructions (Addendum)
 There were two small polyps removed - both look benign. One was not recovered.  There was an area of inflammation in the colon - not clear why. I took biopsies. It could be diverticulitis (since you have diverticulosis) which can resolve with out antibiotics. If you develop abdominal pain please let us  know and would most likely prescribe antibiotics.  I will let you know pathology results and when to have another routine colonoscopy by mail and/or My Chart.  I appreciate the opportunity to care for you. Kenney Peacemaker, MD, [facility]   **Handouts given on polyps and diverticulosis**  YOU HAD AN ENDOSCOPIC PROCEDURE TODAY AT THE [facility]:   Refer to the procedure report that was given to you for any specific questions about what was found during the examination.  If the procedure report does not answer your questions, please call your gastroenterologist to clarify.  If you requested that your care partner not be given the details of your procedure findings, then the procedure report has been included in a sealed envelope for you to review at your convenience later.  YOU SHOULD EXPECT: Some feelings of bloating in the abdomen. Passage of more gas than usual.  Walking can help get rid of the air that was put into your GI tract during the procedure and reduce the bloating. If you had a lower endoscopy (such as a colonoscopy or flexible sigmoidoscopy) you may notice spotting of blood in your stool or on the toilet paper. If you underwent a bowel prep for your procedure, you may not have a normal bowel movement for a few days.  Please Note:  You might notice some irritation and congestion in your nose or some drainage.  This is from the oxygen used during your procedure.  There is no need for concern and it should clear up in a day or so.  SYMPTOMS TO REPORT IMMEDIATELY:  Following lower endoscopy (colonoscopy or flexible sigmoidoscopy):  Excessive amounts of blood in the  stool  Significant tenderness or worsening of abdominal pains  Swelling of the abdomen that is new, acute  Fever of 100F or higher  For urgent or emergent issues, a gastroenterologist can be reached at any hour by calling [phone]. Do not use MyChart messaging for urgent concerns.    DIET:  We do recommend a small meal at first, but then you may proceed to your regular diet.  Drink plenty of fluids but you should avoid alcoholic beverages for 24 hours.  ACTIVITY:  You should plan to take it easy for the rest of today and you should NOT DRIVE or use heavy machinery until tomorrow (because of the sedation medicines used during the test).    FOLLOW UP: Our staff will call the number listed on your records the next business day following your procedure.  We will call around 7:15- 8:00 am to check on you and address any questions or concerns that you may have regarding the information given to you following your procedure. If we do not reach you, we will leave a message.     If any biopsies were taken you will be contacted by phone or by letter within the next 1-3 weeks.  Please call us  at [phone] if you have not heard about the biopsies in 3 weeks.    SIGNATURES/CONFIDENTIALITY: You and/or your care partner have signed paperwork which will be entered into your electronic medical record.  These signatures attest to the fact that that the information  above on your After Visit Summary has been reviewed and is understood.  Full responsibility of the confidentiality of this discharge information lies with you and/or your care-partner.

## 2023-09-01 NOTE — Progress Notes (Signed)
 Vss nad trans to pacu

## 2023-09-01 NOTE — Progress Notes (Signed)
 Called to room to assist during endoscopic procedure.  Patient ID and intended procedure confirmed with present staff. Received instructions for my participation in the procedure from the performing physician.

## 2023-09-04 ENCOUNTER — Telehealth: Payer: Self-pay

## 2023-09-04 NOTE — Telephone Encounter (Signed)
 Post procedure follow up call, no answer

## 2023-09-06 ENCOUNTER — Encounter: Payer: Self-pay | Admitting: Internal Medicine

## 2023-09-06 LAB — SURGICAL PATHOLOGY

## 2023-09-09 ENCOUNTER — Encounter: Payer: Self-pay | Admitting: Internal Medicine

## 2023-09-11 MED ORDER — TIRZEPATIDE 7.5 MG/0.5ML ~~LOC~~ SOAJ: 7.5000 mg | SUBCUTANEOUS | 0 refills | Status: DC

## 2023-10-03 ENCOUNTER — Encounter (HOSPITAL_BASED_OUTPATIENT_CLINIC_OR_DEPARTMENT_OTHER): Payer: Self-pay

## 2023-10-03 ENCOUNTER — Other Ambulatory Visit: Payer: Self-pay

## 2023-10-03 ENCOUNTER — Emergency Department (HOSPITAL_BASED_OUTPATIENT_CLINIC_OR_DEPARTMENT_OTHER)

## 2023-10-03 ENCOUNTER — Emergency Department (HOSPITAL_BASED_OUTPATIENT_CLINIC_OR_DEPARTMENT_OTHER)
Admission: EM | Admit: 2023-10-03 | Discharge: 2023-10-03 | Disposition: A | Discharge status: Home / Self Care | Payer: Worker's Compensation | Attending: Emergency Medicine | Admitting: Emergency Medicine

## 2023-10-03 DIAGNOSIS — G8911 Acute pain due to trauma: Secondary | ICD-10-CM | POA: Insufficient documentation

## 2023-10-03 DIAGNOSIS — Z79899 Other long term (current) drug therapy: Secondary | ICD-10-CM | POA: Insufficient documentation

## 2023-10-03 DIAGNOSIS — W109XXA Fall (on) (from) unspecified stairs and steps, initial encounter: Secondary | ICD-10-CM | POA: Diagnosis not present

## 2023-10-03 DIAGNOSIS — I10 Essential (primary) hypertension: Secondary | ICD-10-CM | POA: Diagnosis not present

## 2023-10-03 DIAGNOSIS — Z7984 Long term (current) use of oral hypoglycemic drugs: Secondary | ICD-10-CM | POA: Diagnosis not present

## 2023-10-03 DIAGNOSIS — E119 Type 2 diabetes mellitus without complications: Secondary | ICD-10-CM | POA: Insufficient documentation

## 2023-10-03 DIAGNOSIS — Y990 Civilian activity done for income or pay: Secondary | ICD-10-CM | POA: Insufficient documentation

## 2023-10-03 DIAGNOSIS — W19XXXA Unspecified fall, initial encounter: Secondary | ICD-10-CM

## 2023-10-03 DIAGNOSIS — M25572 Pain in left ankle and joints of left foot: Secondary | ICD-10-CM | POA: Diagnosis present

## 2023-10-03 DIAGNOSIS — Z87891 Personal history of nicotine dependence: Secondary | ICD-10-CM | POA: Insufficient documentation

## 2023-10-03 NOTE — Discharge Instructions (Signed)
 As discussed, x-ray did not show any obvious fracture or dislocation.  Recommend close follow-up with the orthopedic specialist in the outpatient setting.  Continue rest, ice, elevate and take anti-inflammatories or Tylenol  for pain.  Please do not hesitate to return to emergency department if the worrisome signs and symptoms we discussed become apparent.

## 2023-10-03 NOTE — ED Provider Notes (Signed)
 [place] EMERGENCY DEPARTMENT AT [facility] Provider Note   CSN: [id number] Arrival date & time: 10/03/23  1131     History  Chief Complaint  Patient presents with   Ankle Pain    [name] is a 58 y.o. female.   Ankle Pain   59 year old female presents emergency department complaints of left-sided ankle pain.  Patient states that she was walking down the steps earlier today and accidentally slipped falling backwards.  States that she slid down 4-5 steps.  Denies trauma to head, LOC, blood thinner use.  Is complaining of left ankle pain, swelling.  Did have surgery on her left ankle August 2024 with hardware placed in left ankle secondary to fracture.  Denies any chest pain, back pain, neck pain, abdominal pain, pain otherwise in upper or lower extremities.  Past medical history significant for hyperlipidemia, diabetes mellitus, hypertension, obesity, migraine, episodic lightheadedness  Home Medications Prior to Admission medications   Medication Sig Start Date End Date Taking? Authorizing Provider  amLODipine  (NORVASC ) 5 MG tablet TAKE 1 TABLET BY MOUTH EVERY DAY 04/17/23   Burns, Beckey Bourgeois, MD  atorvastatin  (LIPITOR) 40 MG tablet Take 1 tablet (40 mg total) by mouth daily. 12/09/22   Colene Dauphin, MD  escitalopram  (LEXAPRO ) 20 MG tablet Take 1 tablet (20 mg total) by mouth daily. 06/16/23   Colene Dauphin, MD  fluticasone (FLONASE) 50 MCG/ACT nasal spray Place 2 sprays into both nostrils 2 (two) times daily as needed for allergies.    [provider]  glucose blood (ONETOUCH VERIO) test strip Use as instructed to check sugar daily 01/20/20   Burns, Beckey Bourgeois, MD  glucose blood test strip OneTouch Verio test strips  TEST 4X DAILY    [provider]  labetalol  (NORMODYNE ) 200 MG tablet TAKE TWO TABLETS BY MOUTH THREE TIMES DAILY 12/07/21   Colene Dauphin, MD  metFORMIN  (GLUCOPHAGE ) 500 MG tablet TAKE 1 TABLET BY MOUTH 2 TIMES DAILY WITH A MEAL. 06/21/23    Colene Dauphin, MD  polyethylene glycol powder (GLYCOLAX /MIRALAX ) 17 GM/SCOOP powder Take 238 g by mouth daily. 08/17/23   Kenney Peacemaker, MD  RESTASIS 0.05 % ophthalmic emulsion INSTILL 1 DROP INTO BOTH EYES TWICE A DAY INSTILL 1 DROP INTO BOTH EYES TWICE DAILY 01/03/19   [provider]  tirzepatide  (MOUNJARO ) 7.5 MG/0.5ML Pen Inject 7.5 mg into the skin once a week. 09/11/23   Colene Dauphin, MD  triamterene -hydrochlorothiazide (DYAZIDE) 37.5-25 MG capsule Take 1 each (1 capsule total) by mouth daily. 12/07/21   Colene Dauphin, MD      Allergies    Lisinopril and Olmesartan medoxomil    Review of Systems   Review of Systems  All other systems reviewed and are negative.   Physical Exam Updated Vital Signs BP (!) 121/90 (BP Location: Left Arm)   Pulse 85   Temp 97.9 F (36.6 C) (Oral)   Resp 18   Ht 5\' 4"  (1.626 m)   Wt 73.5 kg   SpO2 98%   BMI 27.81 kg/m  Physical Exam Vitals and nursing note reviewed.  Constitutional:      General: She is not in acute distress.    Appearance: She is well-developed.  HENT:     Head: Normocephalic and atraumatic.  Eyes:     Conjunctiva/sclera: Conjunctivae normal.  Cardiovascular:     Rate and Rhythm: Normal rate and regular rhythm.     Heart sounds: No murmur heard. Pulmonary:  Effort: Pulmonary effort is normal. No respiratory distress.     Breath sounds: Normal breath sounds.  Abdominal:     Palpations: Abdomen is soft.     Tenderness: There is no abdominal tenderness.  Musculoskeletal:        General: No swelling.     Cervical back: Neck supple.     Comments: Swelling noted diffusely left ankle with some ecchymosis appreciated over medial/lateral epicondyle.  Tenderness of these areas is also squeezing of syndesmosis.  No tenderness otherwise of left ankle, foot, knee, hip.  Pedal and posterior tibial pulses 2+ bilaterally.  Able to range left ankle and digits of left foot fully.  Skin:    General: Skin is warm and dry.      Capillary Refill: Capillary refill takes less than 2 seconds.  Neurological:     Mental Status: She is alert.  Psychiatric:        Mood and Affect: Mood normal.     ED Results / Procedures / Treatments   Labs (all labs ordered are listed, but only abnormal results are displayed) Labs Reviewed - No data to display  EKG None  Radiology DG Ankle Complete Left Result Date: 10/03/2023 CLINICAL DATA:  Fall with ankle pain EXAM: LEFT ANKLE COMPLETE - 3+ VIEW COMPARISON:  03/29/2023 FINDINGS: Surgical plate and fixating screws at the distal fibula with screw fixation of the medial malleolar region. Remote trimalleolar fracture. No definite acute displaced fracture is seen. Moderate plantar calcaneal spur. Soft tissue swelling. IMPRESSION: No definite acute osseous abnormality. Remote trimalleolar fracture with ORIF. Soft tissue swelling. Electronically Signed   By: Esmeralda Hedge M.D.   On: 10/03/2023 15:38    Procedures Procedures    Medications Ordered in ED Medications - No data to display  ED Course/ Medical Decision Making/ A&P                                 Medical Decision Making Amount and/or Complexity of Data Reviewed Radiology: ordered.   This patient presents to the ED for concern of ankle pain, this involves an extensive number of treatment options, and is a complaint that carries with it a high risk of complications and morbidity.  The differential diagnosis includes fracture, strain/pain, dislocation, ligament/tendon injury, neurovascular compromise septic arthritis, ischemic him,   Co morbidities that complicate the patient evaluation  See HPI   Additional history obtained:  Additional history obtained from EMR External records from outside source obtained and reviewed including hospital records   Lab Tests:  N/a   Imaging Studies ordered:  I ordered imaging studies including Left ankle x-ray  I independently visualized and interpreted imaging  which showed no acute abnormality.  Remote trimalleolar repair I agree with the radiologist interpretation   Cardiac Monitoring: / EKG:  N/a   Consultations Obtained:  N/a   Problem List / ED Course / Critical interventions / Medication management  Left ankle pain Reevaluation of the patient showed that the patient stayed the same I have reviewed the patients home medicines and have made adjustments as needed   Social Determinants of Health:  Former cigarette use.  Denies illicit drug use.   Test / Admission - Considered:  Left ankle pain Vitals signs within normal range and stable throughout visit. Imaging studies significant for: See above 58 year old female presents emergency department complaints of left-sided ankle pain.  Patient states that she was walking down the steps  earlier today and accidentally slipped falling backwards.  States that she slid down 4-5 steps.  Denies trauma to head, LOC, blood thinner use.  Is complaining of left ankle pain, swelling.  Did have surgery on her left ankle August 2024 with hardware placed in left ankle secondary to fracture.  Denies any chest pain, back pain, neck pain, abdominal pain, pain otherwise in upper or lower extremities. On exam, patient with swelling as well as ecchymosis to left ankle with diffuse tenderness as above.  No pulse deficit to suggest ischemic limb.  Overlying skin changes concerning for secondary infectious process.  X-ray obtained of area which showed no definitive fracture but with prior ORIF trimalleolar repair.  Patient reassured by findings.  Does have crutches at home to help aid in ambulation.  Will place patient in ASO brace and be concern for possible ankle sprain.  Will recommend symptomatic therapy as described in AVS with close follow-up with orthopedic in the outpatient setting.  Treatment plan discussed with patient and she acknowledged her standing was agreeable to said plan.  Patient overall  well-appearing, afebrile in no acute distress. Worrisome signs and symptoms were discussed with the patient, and the patient acknowledged understanding to return to the ED if noticed. Patient was stable upon discharge.          Final Clinical Impression(s) / ED Diagnoses Final diagnoses:  Fall, initial encounter  Acute left ankle pain    Rx / DC Orders ED Discharge Orders     None         Fluvanna Butter, [place] 10/03/23 1636    Rosealee Concha, MD 10/03/23 2124

## 2023-10-03 NOTE — ED Triage Notes (Signed)
 In for eval of slip and fall at work. C/O left ankle pain with PMH of surgery in August. Happened at approx 0850 today. Ice pack in place. Hit both elbows. Denies hitting head.

## 2023-10-05 ENCOUNTER — Ambulatory Visit (INDEPENDENT_AMBULATORY_CARE_PROVIDER_SITE_OTHER): Payer: Worker's Compensation | Admitting: Surgical

## 2023-10-05 DIAGNOSIS — Z9889 Other specified postprocedural states: Secondary | ICD-10-CM

## 2023-10-05 DIAGNOSIS — Z8781 Personal history of (healed) traumatic fracture: Secondary | ICD-10-CM

## 2023-10-08 ENCOUNTER — Encounter: Payer: Self-pay | Admitting: Surgical

## 2023-10-08 NOTE — Progress Notes (Signed)
 Office Visit Note   Patient: [name]           Date of Birth: [birth year]           MRN: [id number] Visit Date: 10/05/2023 Requested by: Colene Dauphin, MD [address] [place],  [place] [address] PCP: Colene Dauphin, MD  Subjective: Chief Complaint  Patient presents with   Left Ankle - Pain    HPI: [name] is a 58 y.o. female who presents to the office reporting left ankle pain.  Patient has history of left ankle ORIF on 12/29/2022.  She recovered well from that procedure and she really has not had any issues with her left ankle up until 10/03/2023 when she fell down 4-5 steps.  She was seen at the emergency department at [facility] with radiographs demonstrating no new fracture or dislocation.  She has had increased swelling in the left ankle but has been able to put weight on it as of today.  She is walking an ankle brace.  Feels pain is about 30 to 40% improved compared with the date of injury.  No mechanical symptoms in the ankle.  She has had very minimal bruising in the ankle.  She has been doing rest ice elevation and compression with good results.  Denies any other joint complaints aside from some mild soreness in the front of her proximal tibia where she struck her shin.  No groin pain..                ROS: All systems reviewed are negative as they relate to the chief complaint within the history of present illness.  Patient denies fevers or chills.  Assessment & Plan: Visit Diagnoses:  1. S/P ORIF (open reduction internal fixation) fracture     Plan: Patient is a 58 year old female who presents for evaluation of left ankle injury.  She injured her ankle a couple days ago on 5/27 after falling down several stairs.  She has history of prior ORIF to this ankle but there is no evidence of recurrent fracture on radiographs obtained at the emergency department.  With about 40% improvement in her symptoms and her ability to bear weight, we discussed options  available to patient.  Main options would be waiting this out to see if this continues to improve rapidly from a symptom standpoint with repeat radiographs potentially in about 8 to 9 days to assess for any evidence of fracture.  Another option would be to obtain CT scan to look for occult fracture.  We agreed to hold off on CT scan for now but if patient's pain plateaus or worsens, she will call and we can order scan.  Follow-up in about a week for repeat radiographs potentially based on her symptom level.  Follow-Up Instructions: No follow-ups on file.   Orders:  No orders of the defined types were placed in this encounter.  No orders of the defined types were placed in this encounter.     Procedures: No procedures performed   Clinical Data: No additional findings.  Objective: Vital Signs: There were no vitals taken for this visit.  Physical Exam:  Constitutional: Patient appears well-developed HEENT:  Head: Normocephalic Eyes:EOM are normal Neck: Normal range of motion Cardiovascular: Normal rate Pulmonary/chest: Effort normal Neurologic: Patient is alert Skin: Skin is warm Psychiatric: Patient has normal mood and affect  Ortho Exam: Ortho exam demonstrates left ankle with palpable DP pulse.  Intact ankle dorsiflexion, plantarflexion, inversion, eversion with the  equivalent strength compared with contralateral side.  No laxity of syndesmosis compared to the contralateral side.  Negative anterior drawer of the left ankle.  There is no tenderness over the medial malleolus, lateral malleolus, calcaneus.  No significant pain with subtalar range of motion.  She does have some tenderness along the course of the CFL and ATFL with reproduced pain in these regions with putting the ATFL under tension.  No tenderness over the deltoid ligament.  No tenderness over the fifth metatarsal base or Lisfranc complex.  Achilles tendon is palpable and intact.  Anterior tibialis and EHL tendons are  palpable and intact.  Specialty Comments:  No specialty comments available.  Imaging: No results found.   PMFS History: Patient Active Problem List   Diagnosis Date Noted   Other long term (current) drug therapy 06/16/2023   Closed bimalleolar fracture of left ankle 01/08/2023   Episodic lightheadedness 02/23/2022   Acute pain of left knee 12/07/2021   Onychomycosis of toenail 06/08/2021   Right knee pain 11/30/2020   Migraines - Dr Tresia Fruit 05/31/2020   Ventral hernia without obstruction or gangrene 11/14/2019   Hair loss 11/14/2019   Cyst of nasal sinus 06/11/2019   Deviated nasal septum 06/11/2019   Thyroid  nodule 06/11/2019   Cervicogenic headache 05/17/2019   Ganglion cyst of wrist, left 05/29/2018   Hx of colonic polyp 09/20/2017   Anal skin tag 09/13/2017   Anxiety and depression 05/24/2017   Atrophic vaginitis 01/30/2017   Lateral epicondylitis 05/23/2016   Diabetes (HCC) 05/23/2016   Snoring 09/07/2015   Insomnia 06/02/2009   Morbid obesity (HCC) 05/19/2008   Hyperlipidemia 04/17/2008   Essential hypertension 04/17/2008   Allergic rhinitis 04/17/2008   Past Medical History:  Diagnosis Date   ALLERGIC RHINITIS    Allergy    Anal skin tag 09/13/2017   Anemia    History of   Anxiety    Cataract    Bilateral   COMMON MIGRAINE    Diabetes mellitus without complication (HCC)    GLUCOSE INTOLERANCE    Hx of colonic polyp 09/20/2017   Hypercalcemia    HYPERLIPIDEMIA    HYPERTENSION    INSOMNIA    Migraines    OA (osteoarthritis)    OBESITY    RENAL INSUFFICIENCY     Family History  Problem Relation Age of Onset   Hypertension Mother    Dementia Mother    Other Father        MVA   Asthma Brother    Rheum arthritis Maternal Aunt    Diabetes Maternal Grandfather    Colon cancer Neg Hx    Stomach cancer Neg Hx    Rectal cancer Neg Hx    Esophageal cancer Neg Hx     Past Surgical History:  Procedure Laterality Date   BREAST SURGERY  05/09/1996    Implants   cataract Bilateral    CESAREAN SECTION     hypertension post partum  09/07/2006   Not precalmpsia   ORIF ANKLE FRACTURE Left 12/29/2022   Procedure: OPEN REDUCTION INTERNAL FIXATION (ORIF) ANKLE FRACTURE;  Surgeon: Jasmine Mesi, MD;  Location: [facility];  Service: Orthopedics;  Laterality: Left;   Social History   Occupational History   Occupation: [employer]  Tobacco Use   Smoking status: Former    Current packs/day: 0.00    Average packs/day: 0.3 packs/day for 8.0 years (2.0 ttl pk-yrs)    Types: Cigarettes    Start date: 05/09/1997  Quit date: 05/09/2005    Years since quitting: 18.4   Smokeless tobacco: Never   Tobacco comments:    Married, lives with spouse and son. 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  smoked socially  off and on until she became pregnant.  Vaping Use   Vaping status: Every Day   Substances: Nicotine  Substance and Sexual Activity   Alcohol use: Yes    Comment: social   Drug use: No   Sexual activity: Not on file

## 2023-10-13 ENCOUNTER — Ambulatory Visit: Admitting: Surgical

## 2023-10-13 ENCOUNTER — Other Ambulatory Visit (INDEPENDENT_AMBULATORY_CARE_PROVIDER_SITE_OTHER): Payer: Worker's Compensation

## 2023-10-13 DIAGNOSIS — M25572 Pain in left ankle and joints of left foot: Secondary | ICD-10-CM

## 2023-10-13 DIAGNOSIS — S82892A Other fracture of left lower leg, initial encounter for closed fracture: Secondary | ICD-10-CM | POA: Diagnosis not present

## 2023-10-14 ENCOUNTER — Other Ambulatory Visit: Payer: Self-pay | Admitting: Internal Medicine

## 2023-10-14 DIAGNOSIS — I10 Essential (primary) hypertension: Secondary | ICD-10-CM

## 2023-10-15 ENCOUNTER — Encounter: Payer: Self-pay | Admitting: Surgical

## 2023-10-15 NOTE — Progress Notes (Signed)
 Follow-up Office Visit Note   Patient: [name]           Date of Birth: Dec 11, 1965           MRN: [id number] Visit Date: 10/13/2023 Requested by: Colene Dauphin, MD [address] [place],  [place] [address] PCP: Colene Dauphin, MD  Subjective: Chief Complaint  Patient presents with   Left Ankle - Pain, Follow-up    Fall 10/03/2023    HPI: [name] is a 58 y.o. female who returns to the office for follow-up visit.    Plan at last visit was: Patient is a 58 year old female who presents for evaluation of left ankle injury. She injured her ankle a couple days ago on 5/27 after falling down several stairs. She has history of prior ORIF to this ankle but there is no evidence of recurrent fracture on radiographs obtained at the emergency department. With about 40% improvement in her symptoms and her ability to bear weight, we discussed options available to patient. Main options would be waiting this out to see if this continues to improve rapidly from a symptom standpoint with repeat radiographs potentially in about 8 to 9 days to assess for any evidence of fracture. Another option would be to obtain CT scan to look for occult fracture. We agreed to hold off on CT scan for now but if patient's pain plateaus or worsens, she will call and we can order scan. Follow-up in about a week for repeat radiographs potentially based on her symptom level.   Since then, patient notes overall her symptoms are continuing to improve.  She is still ambulatory with ASO and regular shoe.  Really not having any worsening of her symptoms.  Complains primarily of lateral sided ankle pain.  Pain not keeping her up at night.  Taking over-the-counter medication.              ROS: All systems reviewed are negative as they relate to the chief complaint within the history of present illness.  Patient denies fevers or chills.  Assessment & Plan: Visit Diagnoses:  1. Closed fracture of left ankle, initial  encounter   2. Pain in left ankle and joints of left foot     Plan: [name] is a 58 y.o. female who returns to the office for follow-up visit.  Plan from last visit was noted above in HPI.  They now return with continued left ankle pain.  She is here for repeat radiographs.  Overall her symptoms are continuing to trend better but still not resolved by any means.  She has continued lateral sided ankle pain primarily with radiographs taken today demonstrating what appears to be minimally displaced fracture at the distal tip of the fibula.  There is small cortical irregularity in this area that seems different and new compared with last set of radiographs and radiographs taken in the past when she was recovering from her initial ankle fracture injury.  With this small minimally displaced fracture, plan to ambulate for 1 week in fracture boot and then return to ankle support orthotic brace and follow-up for clinical recheck with new x-rays in about 2-3 weeks.  In the meantime, if she has any worsening of symptoms, she will reach out with any concerns.  Follow-Up Instructions: No follow-ups on file.   Orders:  Orders Placed This Encounter  Procedures   XR Ankle Complete Left   No orders of the defined types were placed in this  encounter.     Procedures: No procedures performed   Clinical Data: No additional findings.  Objective: Vital Signs: There were no vitals taken for this visit.  Physical Exam:  Constitutional: Patient appears well-developed HEENT:  Head: Normocephalic Eyes:EOM are normal Neck: Normal range of motion Cardiovascular: Normal rate Pulmonary/chest: Effort normal Neurologic: Patient is alert Skin: Skin is warm Psychiatric: Patient has normal mood and affect  Ortho Exam: Ortho exam demonstrates left ankle with palpable DP pulse.  Intact ankle dorsiflexion, plantarflexion, inversion, eversion.  Achilles tendon is palpable and intact.  Anterior tibialis  tendon palpable and intact.  She has the pain with subtalar range of motion.  She has no significant tenderness over the medial malleolus aside from some mild tenderness in the anterior aspect of the bony prominence.  She has more moderate tenderness over the distal tip of the fibula and along the course of the ATFL.  CFL still tender but less so than last appointment.  Still has some bruising notable in the lateral aspect of the ankle.  No calf tenderness.  Negative Homans' sign.  Specialty Comments:  No specialty comments available.  Imaging: No results found.   PMFS History: Patient Active Problem List   Diagnosis Date Noted   Other long term (current) drug therapy 06/16/2023   Closed bimalleolar fracture of left ankle 01/08/2023   Episodic lightheadedness 02/23/2022   Acute pain of left knee 12/07/2021   Onychomycosis of toenail 06/08/2021   Right knee pain 11/30/2020   Migraines - Dr Tresia Fruit 05/31/2020   Ventral hernia without obstruction or gangrene 11/14/2019   Hair loss 11/14/2019   Cyst of nasal sinus 06/11/2019   Deviated nasal septum 06/11/2019   Thyroid  nodule 06/11/2019   Cervicogenic headache 05/17/2019   Ganglion cyst of wrist, left 05/29/2018   Hx of colonic polyp 09/20/2017   Anal skin tag 09/13/2017   Anxiety and depression 05/24/2017   Atrophic vaginitis 01/30/2017   Lateral epicondylitis 05/23/2016   Diabetes (HCC) 05/23/2016   Snoring 09/07/2015   Insomnia 06/02/2009   Morbid obesity (HCC) 05/19/2008   Hyperlipidemia 04/17/2008   Essential hypertension 04/17/2008   Allergic rhinitis 04/17/2008   Past Medical History:  Diagnosis Date   ALLERGIC RHINITIS    Allergy    Anal skin tag 09/13/2017   Anemia    History of   Anxiety    Cataract    Bilateral   COMMON MIGRAINE    Diabetes mellitus without complication (HCC)    GLUCOSE INTOLERANCE    Hx of colonic polyp 09/20/2017   Hypercalcemia    HYPERLIPIDEMIA    HYPERTENSION    INSOMNIA    Migraines     OA (osteoarthritis)    OBESITY    RENAL INSUFFICIENCY     Family History  Problem Relation Age of Onset   Hypertension Mother    Dementia Mother    Other Father        MVA   Asthma Brother    Rheum arthritis Maternal Aunt    Diabetes Maternal Grandfather    Colon cancer Neg Hx    Stomach cancer Neg Hx    Rectal cancer Neg Hx    Esophageal cancer Neg Hx     Past Surgical History:  Procedure Laterality Date   BREAST SURGERY  05/09/1996   Implants   cataract Bilateral    CESAREAN SECTION     hypertension post partum  09/07/2006   Not precalmpsia   ORIF ANKLE FRACTURE Left 12/29/2022  Procedure: OPEN REDUCTION INTERNAL FIXATION (ORIF) ANKLE FRACTURE;  Surgeon: Jasmine Mesi, MD;  Location: [facility];  Service: Orthopedics;  Laterality: Left;   Social History   Occupational History   Occupation: [employer]  Tobacco Use   Smoking status: Former    Current packs/day: 0.00    Average packs/day: 0.3 packs/day for 8.0 years (2.0 ttl pk-yrs)    Types: Cigarettes    Start date: 05/09/1997    Quit date: 05/09/2005    Years since quitting: 18.4   Smokeless tobacco: Never   Tobacco comments:    Married, lives with spouse and son. 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  smoked socially  off and on until she became pregnant.  Vaping Use   Vaping status: Every Day   Substances: Nicotine  Substance and Sexual Activity   Alcohol use: Yes    Comment: social   Drug use: No   Sexual activity: Not on file

## 2023-10-15 NOTE — Progress Notes (Deleted)
 Office Visit Note   Patient: [name]           Date of Birth: 1965/06/28           MRN: [id number] Visit Date: 10/13/2023 Requested by: Colene Dauphin, MD [address] [place],  [place] [address] PCP: Colene Dauphin, MD  Subjective: Chief Complaint  Patient presents with   Left Ankle - Pain, Follow-up    Fall 10/03/2023    HPI: [name] is a 58 y.o. female who presents to the office reporting ***.                ROS: All systems reviewed are negative as they relate to the chief complaint within the history of present illness.  Patient denies fevers or chills.  Assessment & Plan: Visit Diagnoses:  1. Pain in left ankle and joints of left foot     Plan: ***  Follow-Up Instructions: No follow-ups on file.   Orders:  Orders Placed This Encounter  Procedures   XR Ankle Complete Left   No orders of the defined types were placed in this encounter.     Procedures: No procedures performed   Clinical Data: No additional findings.  Objective: Vital Signs: There were no vitals taken for this visit.  Physical Exam:  Constitutional: Patient appears well-developed HEENT:  Head: Normocephalic Eyes:EOM are normal Neck: Normal range of motion Cardiovascular: Normal rate Pulmonary/chest: Effort normal Neurologic: Patient is alert Skin: Skin is warm Psychiatric: Patient has normal mood and affect  Ortho Exam: ***  Specialty Comments:  No specialty comments available.  Imaging: No results found.   PMFS History: Patient Active Problem List   Diagnosis Date Noted   Other long term (current) drug therapy 06/16/2023   Closed bimalleolar fracture of left ankle 01/08/2023   Episodic lightheadedness 02/23/2022   Acute pain of left knee 12/07/2021   Onychomycosis of toenail 06/08/2021   Right knee pain 11/30/2020   Migraines - Dr Tresia Fruit 05/31/2020   Ventral hernia without obstruction or gangrene 11/14/2019   Hair loss 11/14/2019   Cyst of  nasal sinus 06/11/2019   Deviated nasal septum 06/11/2019   Thyroid  nodule 06/11/2019   Cervicogenic headache 05/17/2019   Ganglion cyst of wrist, left 05/29/2018   Hx of colonic polyp 09/20/2017   Anal skin tag 09/13/2017   Anxiety and depression 05/24/2017   Atrophic vaginitis 01/30/2017   Lateral epicondylitis 05/23/2016   Diabetes (HCC) 05/23/2016   Snoring 09/07/2015   Insomnia 06/02/2009   Morbid obesity (HCC) 05/19/2008   Hyperlipidemia 04/17/2008   Essential hypertension 04/17/2008   Allergic rhinitis 04/17/2008   Past Medical History:  Diagnosis Date   ALLERGIC RHINITIS    Allergy    Anal skin tag 09/13/2017   Anemia    History of   Anxiety    Cataract    Bilateral   COMMON MIGRAINE    Diabetes mellitus without complication (HCC)    GLUCOSE INTOLERANCE    Hx of colonic polyp 09/20/2017   Hypercalcemia    HYPERLIPIDEMIA    HYPERTENSION    INSOMNIA    Migraines    OA (osteoarthritis)    OBESITY    RENAL INSUFFICIENCY     Family History  Problem Relation Age of Onset   Hypertension Mother    Dementia Mother    Other Father        MVA   Asthma Brother    Rheum arthritis Maternal Aunt  Diabetes Maternal Grandfather    Colon cancer Neg Hx    Stomach cancer Neg Hx    Rectal cancer Neg Hx    Esophageal cancer Neg Hx     Past Surgical History:  Procedure Laterality Date   BREAST SURGERY  05/09/1996   Implants   cataract Bilateral    CESAREAN SECTION     hypertension post partum  09/07/2006   Not precalmpsia   ORIF ANKLE FRACTURE Left 12/29/2022   Procedure: OPEN REDUCTION INTERNAL FIXATION (ORIF) ANKLE FRACTURE;  Surgeon: Jasmine Mesi, MD;  Location: [facility];  Service: Orthopedics;  Laterality: Left;   Social History   Occupational History   Occupation: [employer]  Tobacco Use   Smoking status: Former    Current packs/day: 0.00    Average packs/day: 0.3 packs/day for 8.0 years (2.0 ttl pk-yrs)    Types: Cigarettes    Start date: 05/09/1997     Quit date: 05/09/2005    Years since quitting: 18.4   Smokeless tobacco: Never   Tobacco comments:    Married, lives with spouse and son. 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  smoked socially  off and on until she became pregnant.  Vaping Use   Vaping status: Every Day   Substances: Nicotine  Substance and Sexual Activity   Alcohol use: Yes    Comment: social   Drug use: No   Sexual activity: Not on file

## 2023-10-30 ENCOUNTER — Other Ambulatory Visit (INDEPENDENT_AMBULATORY_CARE_PROVIDER_SITE_OTHER): Payer: Self-pay

## 2023-10-30 ENCOUNTER — Encounter: Payer: Self-pay | Admitting: Orthopedic Surgery

## 2023-10-30 ENCOUNTER — Ambulatory Visit: Admitting: Orthopedic Surgery

## 2023-10-30 DIAGNOSIS — M25572 Pain in left ankle and joints of left foot: Secondary | ICD-10-CM

## 2023-10-30 NOTE — Progress Notes (Signed)
 Post-Op Visit Note   Patient: [name]           Date of Birth: 1965-10-25           MRN: [id number] Visit Date: 10/30/2023 PCP: Geofm Glade JINNY, MD   Assessment & Plan:  Chief Complaint:  Chief Complaint  Patient presents with   Left Ankle - Follow-up   Visit Diagnoses:  1. Pain in left ankle and joints of left foot     Plan: Patient presents now about a month out left ankle fracture fixation.  This was an injury that happened at work.  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  Currently working from home.  She has been in a fracture boot.  On exam no calf tenderness negative Homans.  Very good ankle dorsiflexion to about 15 degrees past neutral and plantarflexion about 20 degrees past neutral.  Incisions intact.  Hardware not prominent.  Radiographs look good.  Continue to work from home for the next 4 weeks until her walking endurance improves.  Okay to discontinue the boot after next week.  4 weeks final check with repeat radiographs and release at that time.  Follow-Up Instructions: No follow-ups on file.   Orders:  Orders Placed This Encounter  Procedures   XR Ankle Complete Left   No orders of the defined types were placed in this encounter.   Imaging: XR Ankle Complete Left Result Date: 10/30/2023 AP lateral mortise radiographs left ankle reviewed.  Hardware in good position and alignment from bimalleolar ankle fracture fixation.  No complicating features.  Mortise is symmetric.   PMFS History: Patient Active Problem List   Diagnosis Date Noted   Other long term (current) drug therapy 06/16/2023   Closed bimalleolar fracture of left ankle 01/08/2023   Episodic lightheadedness 02/23/2022   Acute pain of left knee 12/07/2021   Onychomycosis of toenail 06/08/2021   Right knee pain 11/30/2020   Migraines - Dr Ines 05/31/2020   Ventral hernia without obstruction or gangrene 11/14/2019   Hair loss 11/14/2019   Cyst of nasal sinus 06/11/2019   Deviated  nasal septum 06/11/2019   Thyroid  nodule 06/11/2019   Cervicogenic headache 05/17/2019   Ganglion cyst of wrist, left 05/29/2018   Hx of colonic polyp 09/20/2017   Anal skin tag 09/13/2017   Anxiety and depression 05/24/2017   Atrophic vaginitis 01/30/2017   Lateral epicondylitis 05/23/2016   Diabetes (HCC) 05/23/2016   Snoring 09/07/2015   Insomnia 06/02/2009   Morbid obesity (HCC) 05/19/2008   Hyperlipidemia 04/17/2008   Essential hypertension 04/17/2008   Allergic rhinitis 04/17/2008   Past Medical History:  Diagnosis Date   ALLERGIC RHINITIS    Allergy    Anal skin tag 09/13/2017   Anemia    History of   Anxiety    Cataract    Bilateral   COMMON MIGRAINE    Diabetes mellitus without complication (HCC)    GLUCOSE INTOLERANCE    Hx of colonic polyp 09/20/2017   Hypercalcemia    HYPERLIPIDEMIA    HYPERTENSION    INSOMNIA    Migraines    OA (osteoarthritis)    OBESITY    RENAL INSUFFICIENCY     Family History  Problem Relation Age of Onset   Hypertension Mother    Dementia Mother    Other Father        MVA   Asthma Brother    Rheum arthritis Maternal Aunt    Diabetes Maternal Grandfather  Colon cancer Neg Hx    Stomach cancer Neg Hx    Rectal cancer Neg Hx    Esophageal cancer Neg Hx     Past Surgical History:  Procedure Laterality Date   BREAST SURGERY  05/09/1996   Implants   cataract Bilateral    CESAREAN SECTION     hypertension post partum  09/07/2006   Not precalmpsia   ORIF ANKLE FRACTURE Left 12/29/2022   Procedure: OPEN REDUCTION INTERNAL FIXATION (ORIF) ANKLE FRACTURE;  Surgeon: Addie Cordella Hamilton, MD;  Location: [facility];  Service: Orthopedics;  Laterality: Left;   Social History   Occupational History   Occupation: [employer]  Tobacco Use   Smoking status: Former    Current packs/day: 0.00    Average packs/day: 0.3 packs/day for 8.0 years (2.0 ttl pk-yrs)    Types: Cigarettes    Start date: 05/09/1997    Quit date: 05/09/2005    Years  since quitting: 18.4   Smokeless tobacco: Never   Tobacco comments:    Married, lives with spouse and son. 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  smoked socially  off and on until she became pregnant.  Vaping Use   Vaping status: Every Day   Substances: Nicotine  Substance and Sexual Activity   Alcohol use: Yes    Comment: social   Drug use: No   Sexual activity: Not on file

## 2023-11-23 ENCOUNTER — Encounter: Payer: Self-pay | Admitting: Internal Medicine

## 2023-11-24 ENCOUNTER — Other Ambulatory Visit: Payer: Self-pay

## 2023-11-27 ENCOUNTER — Ambulatory Visit (INDEPENDENT_AMBULATORY_CARE_PROVIDER_SITE_OTHER): Payer: Worker's Compensation | Admitting: Orthopedic Surgery

## 2023-11-27 ENCOUNTER — Other Ambulatory Visit: Payer: Self-pay

## 2023-11-27 ENCOUNTER — Encounter: Payer: Self-pay | Admitting: Orthopedic Surgery

## 2023-11-27 DIAGNOSIS — Z8781 Personal history of (healed) traumatic fracture: Secondary | ICD-10-CM

## 2023-11-27 DIAGNOSIS — Z9889 Other specified postprocedural states: Secondary | ICD-10-CM

## 2023-11-27 NOTE — Progress Notes (Signed)
 Post-Op Visit Note   Patient: [name]           Date of Birth: 05/31/1965           MRN: [id number] Visit Date: 11/27/2023 PCP: Geofm Glade JINNY, MD   Assessment & Plan:  Chief Complaint:  Chief Complaint  Patient presents with   Left Ankle - Follow-up, Fracture    DOI: 10/03/23   Visit Diagnoses:  1. S/P ORIF (open reduction internal fixation) fracture     Plan patient is now 8 weeks out left ankle fracture open reduction internal fixation.  Patient has been doing well.  She is going to return to work this week.  Does office work.  Takes ibuprofen as needed.  On exam all incisions are intact.  Mild swelling present but less than I would expect.  Ankle range of motion is excellent with ankle dorsiflexion plantarflexion inversion and eversion symmetric to the other side.  Plan at this time is follow-up as needed.  Needs to avoid prolonged weightbearing activities or impact activities for the rest of the year.   ollow-Up Instructions: No follow-ups on file.   Orders:  Orders Placed This Encounter  Procedures   XR Ankle Complete Left   No orders of the defined types were placed in this encounter.   Imaging: No results found.  PMFS History: Patient Active Problem List   Diagnosis Date Noted   Other long term (current) drug therapy 06/16/2023   Closed bimalleolar fracture of left ankle 01/08/2023   Episodic lightheadedness 02/23/2022   Acute pain of left knee 12/07/2021   Onychomycosis of toenail 06/08/2021   Right knee pain 11/30/2020   Migraines - Dr Ines 05/31/2020   Ventral hernia without obstruction or gangrene 11/14/2019   Hair loss 11/14/2019   Cyst of nasal sinus 06/11/2019   Deviated nasal septum 06/11/2019   Thyroid  nodule 06/11/2019   Cervicogenic headache 05/17/2019   Ganglion cyst of wrist, left 05/29/2018   Hx of colonic polyp 09/20/2017   Anal skin tag 09/13/2017   Anxiety and depression 05/24/2017   Atrophic vaginitis 01/30/2017    Lateral epicondylitis 05/23/2016   Diabetes (HCC) 05/23/2016   Snoring 09/07/2015   Insomnia 06/02/2009   Morbid obesity (HCC) 05/19/2008   Hyperlipidemia 04/17/2008   Essential hypertension 04/17/2008   Allergic rhinitis 04/17/2008   Past Medical History:  Diagnosis Date   ALLERGIC RHINITIS    Allergy    Anal skin tag 09/13/2017   Anemia    History of   Anxiety    Cataract    Bilateral   COMMON MIGRAINE    Diabetes mellitus without complication (HCC)    GLUCOSE INTOLERANCE    Hx of colonic polyp 09/20/2017   Hypercalcemia    HYPERLIPIDEMIA    HYPERTENSION    INSOMNIA    Migraines    OA (osteoarthritis)    OBESITY    RENAL INSUFFICIENCY     Family History  Problem Relation Age of Onset   Hypertension Mother    Dementia Mother    Other Father        MVA   Asthma Brother    Rheum arthritis Maternal Aunt    Diabetes Maternal Grandfather    Colon cancer Neg Hx    Stomach cancer Neg Hx    Rectal cancer Neg Hx    Esophageal cancer Neg Hx     Past Surgical History:  Procedure Laterality Date   BREAST SURGERY  05/09/1996   Implants  cataract Bilateral    CESAREAN SECTION     hypertension post partum  09/07/2006   Not precalmpsia   ORIF ANKLE FRACTURE Left 12/29/2022   Procedure: OPEN REDUCTION INTERNAL FIXATION (ORIF) ANKLE FRACTURE;  Surgeon: Addie Cordella Hamilton, MD;  Location: [facility];  Service: Orthopedics;  Laterality: Left;   Social History   Occupational History   Occupation: [employer]  Tobacco Use   Smoking status: Former    Current packs/day: 0.00    Average packs/day: 0.3 packs/day for 8.0 years (2.0 ttl pk-yrs)    Types: Cigarettes    Start date: 05/09/1997    Quit date: 05/09/2005    Years since quitting: 18.5   Smokeless tobacco: Never   Tobacco comments:    Married, lives with spouse and son. 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  smoked socially  off and on until she became pregnant.  Vaping Use   Vaping status:  Every Day   Substances: Nicotine  Substance and Sexual Activity   Alcohol use: Yes    Comment: social   Drug use: No   Sexual activity: Not on file

## 2023-11-29 ENCOUNTER — Other Ambulatory Visit: Payer: Self-pay

## 2023-11-29 MED ORDER — TRIAMTERENE-HCTZ 37.5-25 MG PO CAPS: 1.0000 | ORAL_CAPSULE | Freq: Every day | ORAL | 2 refills | Status: AC

## 2023-11-29 MED ORDER — LABETALOL HCL 200 MG PO TABS: ORAL_TABLET | ORAL | 1 refills | Status: DC

## 2023-12-10 NOTE — Progress Notes (Unsigned)
 Subjective:    Patient ID: [name], female    DOB: [birth year], 58 y.o.   MRN: [id number]     HPI [name] is here for follow up of her chronic medical problems.  Having still a lot of stress lately some depression.  Having dizzy/lightheadedness spells- occur throughout day.  Yesterday [name] got up and was walking to the kitchen to get water and had to stop because [name] felt like everything around her was moving.  Lasted 1 min maybe less.   Usually occurs with walking.    Medications and allergies reviewed with patient and updated if appropriate.  Current Outpatient Medications on File Prior to Visit  Medication Sig Dispense Refill   amLODipine  (NORVASC ) 5 MG tablet TAKE 1 TABLET BY MOUTH EVERY DAY 90 tablet 1   atorvastatin  (LIPITOR) 40 MG tablet Take 1 tablet (40 mg total) by mouth daily. 90 tablet 3   escitalopram  (LEXAPRO ) 20 MG tablet Take 1 tablet (20 mg total) by mouth daily. 90 tablet 1   fluticasone (FLONASE) 50 MCG/ACT nasal spray Place 2 sprays into both nostrils 2 (two) times daily as needed for allergies.     glucose blood (ONETOUCH VERIO) test strip Use as instructed to check sugar daily 100 strip 3   glucose blood test strip OneTouch Verio test strips  TEST 4X DAILY     labetalol  (NORMODYNE ) 200 MG tablet TAKE TWO TABLETS BY MOUTH THREE TIMES DAILY 540 tablet 1   metFORMIN  (GLUCOPHAGE ) 500 MG tablet TAKE 1 TABLET BY MOUTH 2 TIMES DAILY WITH A MEAL. 180 tablet 1   polyethylene glycol powder (GLYCOLAX /MIRALAX ) 17 GM/SCOOP powder Take 238 g by mouth daily. 255 g 3   RESTASIS 0.05 % ophthalmic emulsion INSTILL 1 DROP INTO BOTH EYES TWICE A DAY INSTILL 1 DROP INTO BOTH EYES TWICE DAILY     tirzepatide  (MOUNJARO ) 7.5 MG/0.5ML Pen Inject 7.5 mg into the skin once a week. 6 mL 0   triamterene -hydrochlorothiazide (DYAZIDE) 37.5-25 MG capsule Take 1 each (1 capsule total) by mouth daily. 90 capsule 2   No current facility-administered medications on file prior to  visit.     Review of Systems  Constitutional:  Negative for fever.  Respiratory:  Negative for cough, shortness of breath and wheezing.   Cardiovascular:  Positive for palpitations (chronic - a little more frequent). Negative for chest pain and leg swelling.  Gastrointestinal:  Positive for abdominal pain (upper abdominal pain intermittent w/ nausea) and nausea. Negative for constipation and diarrhea.       No gerd  Neurological:  Positive for dizziness, light-headedness and headaches (stress?).  Psychiatric/Behavioral:  Positive for dysphoric mood and sleep disturbance (not getting enough - difficulty falling asleep).        Objective:   Vitals:   12/14/23 0833  BP: 116/74  Pulse: 64  Temp: 98.4 F (36.9 C)  SpO2: 98%   BP Readings from Last 3 Encounters:  12/14/23 116/74  10/03/23 (!) 121/90  09/01/23 123/80   Wt Readings from Last 3 Encounters:  12/14/23 164 lb (74.4 kg)  10/03/23 162 lb (73.5 kg)  09/01/23 160 lb (72.6 kg)   Body mass index is 28.15 kg/m.    Physical Exam Constitutional:      General: [name] is not in acute distress.    Appearance: Normal appearance.  HENT:     Head: Normocephalic and atraumatic.  Eyes:     Conjunctiva/sclera: Conjunctivae normal.  Cardiovascular:  Rate and Rhythm: Normal rate and regular rhythm.     Heart sounds: Normal heart sounds.  Pulmonary:     Effort: Pulmonary effort is normal. No respiratory distress.     Breath sounds: Normal breath sounds. No wheezing.  Musculoskeletal:     Cervical back: Neck supple.     Right lower leg: No edema.     Left lower leg: No edema.  Lymphadenopathy:     Cervical: No cervical adenopathy.  Skin:    General: Skin is warm and dry.     Findings: No rash.  Neurological:     Mental Status: [name] is alert. Mental status is at baseline.  Psychiatric:        Mood and Affect: Mood normal.        Behavior: Behavior normal.        Lab Results  Component Value Date   WBC 6.7  06/16/2023   HGB 15.2 (H) 06/16/2023   HCT 44.1 06/16/2023   PLT 292.0 06/16/2023   GLUCOSE 89 06/16/2023   CHOL 160 06/16/2023   TRIG 169.0 (H) 06/16/2023   HDL 43.30 06/16/2023   LDLDIRECT 81.0 06/10/2022   LDLCALC 83 06/16/2023   ALT 22 06/16/2023   AST 22 06/16/2023   NA 141 06/16/2023   K 3.3 (L) 06/16/2023   CL 98 06/16/2023   CREATININE 0.99 06/16/2023   BUN 9 06/16/2023   CO2 29 06/16/2023   TSH 0.53 06/16/2023   HGBA1C 5.2 06/16/2023   MICROALBUR 0.3 11/14/2019     Assessment & Plan:    See Problem List for Assessment and Plan of chronic medical problems.

## 2023-12-10 NOTE — Patient Instructions (Addendum)
      Blood work was ordered.       Medications changes include :   stop metformin ,  decrease amlodipine  2.5 mg daily.   Decrease lexapro  to 10 mg daily for two weeks and then stop it.   Start fluoxetine  20 mg daily    Monitor your BP at home - we want this to be less than 130/80    Return in about 6 weeks (around 01/25/2024) for Physical Exam.

## 2023-12-13 ENCOUNTER — Encounter: Payer: Self-pay | Admitting: Internal Medicine

## 2023-12-14 ENCOUNTER — Ambulatory Visit: Payer: Self-pay | Admitting: Internal Medicine

## 2023-12-14 ENCOUNTER — Ambulatory Visit: Payer: No Typology Code available for payment source | Admitting: Internal Medicine

## 2023-12-14 VITALS — BP 116/74 | HR 64 | Temp 98.4°F | Ht 64.0 in | Wt 164.0 lb

## 2023-12-14 DIAGNOSIS — E1165 Type 2 diabetes mellitus with hyperglycemia: Secondary | ICD-10-CM

## 2023-12-14 DIAGNOSIS — I10 Essential (primary) hypertension: Secondary | ICD-10-CM

## 2023-12-14 DIAGNOSIS — E7849 Other hyperlipidemia: Secondary | ICD-10-CM | POA: Diagnosis not present

## 2023-12-14 DIAGNOSIS — Z7984 Long term (current) use of oral hypoglycemic drugs: Secondary | ICD-10-CM | POA: Diagnosis not present

## 2023-12-14 DIAGNOSIS — F419 Anxiety disorder, unspecified: Secondary | ICD-10-CM

## 2023-12-14 DIAGNOSIS — Z7985 Long-term (current) use of injectable non-insulin antidiabetic drugs: Secondary | ICD-10-CM | POA: Diagnosis not present

## 2023-12-14 DIAGNOSIS — F32A Depression, unspecified: Secondary | ICD-10-CM

## 2023-12-14 DIAGNOSIS — Z6828 Body mass index (BMI) 28.0-28.9, adult: Secondary | ICD-10-CM

## 2023-12-14 DIAGNOSIS — G4709 Other insomnia: Secondary | ICD-10-CM

## 2023-12-14 LAB — COMPREHENSIVE METABOLIC PANEL WITH GFR
ALT: 20 U/L (ref 0–35)
AST: 18 U/L (ref 0–37)
Albumin: 4.4 g/dL (ref 3.5–5.2)
Alkaline Phosphatase: 64 U/L (ref 39–117)
BUN: 9 mg/dL (ref 6–23)
CO2: 26 meq/L (ref 19–32)
Calcium: 9.6 mg/dL (ref 8.4–10.5)
Chloride: 101 meq/L (ref 96–112)
Creatinine, Ser: 0.8 mg/dL (ref 0.40–1.20)
GFR: 81.99 mL/min (ref >60.00)
Glucose, Bld: 83 mg/dL (ref 70–99)
Potassium: 3.2 meq/L — ABNORMAL LOW (ref 3.5–5.1)
Sodium: 142 meq/L (ref 135–145)
Total Bilirubin: 0.5 mg/dL (ref 0.2–1.2)
Total Protein: 6.9 g/dL (ref 6.0–8.3)

## 2023-12-14 LAB — MICROALBUMIN / CREATININE URINE RATIO
Creatinine,U: 142.1 mg/dL
Microalb Creat Ratio: 29.4 mg/g (ref 0.0–30.0)
Microalb, Ur: 4.2 mg/dL — ABNORMAL HIGH (ref 0.0–1.9)

## 2023-12-14 LAB — LIPID PANEL
Cholesterol: 147 mg/dL (ref 0–200)
HDL: 38.7 mg/dL — ABNORMAL LOW (ref >39.00)
LDL Cholesterol: 80 mg/dL (ref 0–99)
NonHDL: 108.41
Total CHOL/HDL Ratio: 4
Triglycerides: 141 mg/dL (ref 0.0–149.0)
VLDL: 28.2 mg/dL (ref 0.0–40.0)

## 2023-12-14 LAB — HEMOGLOBIN A1C: Hgb A1c MFr Bld: 5.5 % (ref 4.6–6.5)

## 2023-12-14 MED ORDER — AMLODIPINE BESYLATE 2.5 MG PO TABS: 2.5000 mg | ORAL_TABLET | Freq: Every day | ORAL | 1 refills | Status: DC

## 2023-12-14 MED ORDER — FLUOXETINE HCL 20 MG PO CAPS: 20.0000 mg | ORAL_CAPSULE | Freq: Every day | ORAL | 1 refills | Status: DC

## 2023-12-14 MED ORDER — POTASSIUM CHLORIDE CRYS ER 20 MEQ PO TBCR: 20.0000 meq | EXTENDED_RELEASE_TABLET | Freq: Every day | ORAL | 3 refills | Status: AC

## 2023-12-14 NOTE — Assessment & Plan Note (Addendum)
 Chronic She has lost a significant amount of weight with Mounjaro  Would like to lose additional weight Encouraged healthy diet, decrease portions and regular exercise Continue mounjaro  for diabetes Stressed good amount of protein and regular exercise-stressed that she has likely lost muscle mass when she increases her mortality risk and she needs to be exercising on a regular basis-cardio with resistance training

## 2023-12-14 NOTE — Assessment & Plan Note (Signed)
 Chronic Regular exercise and healthy diet encouraged Working on weight loss Check lipid panel, CMP Continue atorvastatin  40 mg daily

## 2023-12-14 NOTE — Assessment & Plan Note (Addendum)
 Chronic  Lab Results  Component Value Date   HGBA1C 5.2 06/16/2023   Sugars well controlled Check A1c, urine albumin/creatinine ratio Currently taking metformin  500 mg twice daily-will discontinue Continue Mounjaro  7.5 mg weekly Stressed regular exercise, diabetic diet

## 2023-12-14 NOTE — Assessment & Plan Note (Addendum)
 Chronic Still with high stress, increase depression Multifactorial She has been on Lexapro  for a while so I think she would benefit from a change Taper off lexapro  - 10 mg x 2 weeks then stop and start fluoxetine  20 mg daily Follow-up in 6 weeks

## 2023-12-14 NOTE — Assessment & Plan Note (Addendum)
 Chronic Sleep still not great Difficulty falling asleep, but once she is asleep she sleeps okay Chronic fatigue Not currently in any medication Encouraged regular exercise and good sleep hygiene Will change SSRI to see if that helps more with anxiety and depression

## 2023-12-14 NOTE — Assessment & Plan Note (Addendum)
 Chronic Blood pressure well controlled but having lightheaded/dizzy-may be having some orthostasis Monitor BP at home CMP Decrease amlodipine  to 2.5 mg  Continue labetalol  400 mg 3 times daily, triamterene -HCTZ 37.5-25 mg daily

## 2024-01-11 ENCOUNTER — Other Ambulatory Visit: Payer: Self-pay | Admitting: Internal Medicine

## 2024-01-24 ENCOUNTER — Encounter: Payer: Self-pay | Admitting: Internal Medicine

## 2024-01-24 NOTE — Patient Instructions (Addendum)
      Medications changes include :   stop amlodipine      Return in about 6 months (around 07/24/2024) for Physical Exam.

## 2024-01-24 NOTE — Progress Notes (Unsigned)
 Subjective:    Patient ID: [name], female    DOB: 05/09/1966, 58 y.o.   MRN: [id number]     HPI [name] is here for follow up of her chronic medical problems.  6 weeks ago - decreased amlodipine  to 2.5 mg  due to lightheadedness/dizziness.  Also tapered off lexapro  and started fluoxetine .  Stopped metformin   Switching to the fluoxetine  has helped-she feels much better.  She still has some good days and bad days but does feel like the medication is helping more.  The lightheadedness/dizziness improved with the decrease amlodipine , but she still has it.  She is not currently exercising.  She knows she needs to be exercising.  She is trying to work on controlling her stress level overall.  Sleep is still not great.  She is currently taking Benadryl and Unisom which helped, but she knows she should not be taking those long-term.    Medications and allergies reviewed with patient and updated if appropriate.  Current Outpatient Medications on File Prior to Visit  Medication Sig Dispense Refill   amLODipine  (NORVASC ) 2.5 MG tablet Take 1 tablet (2.5 mg total) by mouth daily. 90 tablet 1   atorvastatin  (LIPITOR) 40 MG tablet TAKE 1 TABLET BY MOUTH EVERY DAY 90 tablet 3   FLUoxetine  (PROZAC ) 20 MG capsule Take 1 capsule (20 mg total) by mouth daily. 90 capsule 1   fluticasone (FLONASE) 50 MCG/ACT nasal spray Place 2 sprays into both nostrils 2 (two) times daily as needed for allergies.     glucose blood (ONETOUCH VERIO) test strip Use as instructed to check sugar daily 100 strip 3   glucose blood test strip OneTouch Verio test strips  TEST 4X DAILY     labetalol  (NORMODYNE ) 200 MG tablet TAKE TWO TABLETS BY MOUTH THREE TIMES DAILY 540 tablet 1   polyethylene glycol powder (GLYCOLAX /MIRALAX ) 17 GM/SCOOP powder Take 238 g by mouth daily. 255 g 3   potassium chloride  SA (KLOR-CON  M) 20 MEQ tablet Take 1 tablet (20 mEq total) by mouth daily. 90 tablet 3   RESTASIS 0.05 %  ophthalmic emulsion INSTILL 1 DROP INTO BOTH EYES TWICE A DAY INSTILL 1 DROP INTO BOTH EYES TWICE DAILY     tirzepatide  (MOUNJARO ) 7.5 MG/0.5ML Pen Inject 7.5 mg into the skin once a week. 6 mL 0   triamterene -hydrochlorothiazide (DYAZIDE) 37.5-25 MG capsule Take 1 each (1 capsule total) by mouth daily. 90 capsule 2   No current facility-administered medications on file prior to visit.     Review of Systems  Respiratory:  Negative for shortness of breath.   Cardiovascular:  Positive for palpitations (occ - chronic). Negative for chest pain.  Gastrointestinal:  Negative for nausea.  Neurological:  Positive for light-headedness (imporved but still there). Negative for headaches.  Psychiatric/Behavioral:  Positive for sleep disturbance.        Objective:   Vitals:   01/25/24 1006 01/25/24 1010  BP: 108/70 112/80  Pulse: 79   Temp: 98.1 F (36.7 C)   SpO2: 99%    BP Readings from Last 3 Encounters:  01/25/24 112/80  12/14/23 116/74  10/03/23 (!) 121/90   Wt Readings from Last 3 Encounters:  01/25/24 166 lb (75.3 kg)  12/14/23 164 lb (74.4 kg)  10/03/23 162 lb (73.5 kg)   Body mass index is 28.49 kg/m.    Physical Exam Constitutional:      General: She is not in acute distress.    Appearance: Normal  appearance. She is not ill-appearing.  HENT:     Head: Normocephalic and atraumatic.  Skin:    General: Skin is warm and dry.  Neurological:     Mental Status: She is alert. Mental status is at baseline.  Psychiatric:        Mood and Affect: Mood normal.        Behavior: Behavior normal.        Thought Content: Thought content normal.        Judgment: Judgment normal.        Lab Results  Component Value Date   WBC 6.7 06/16/2023   HGB 15.2 (H) 06/16/2023   HCT 44.1 06/16/2023   PLT 292.0 06/16/2023   GLUCOSE 83 12/14/2023   CHOL 147 12/14/2023   TRIG 141.0 12/14/2023   HDL 38.70 (L) 12/14/2023   LDLDIRECT 81.0 06/10/2022   LDLCALC 80 12/14/2023   ALT 20  12/14/2023   AST 18 12/14/2023   NA 142 12/14/2023   K 3.2 (L) 12/14/2023   CL 101 12/14/2023   CREATININE 0.80 12/14/2023   BUN 9 12/14/2023   CO2 26 12/14/2023   TSH 0.53 06/16/2023   HGBA1C 5.5 12/14/2023   MICROALBUR 4.2 (H) 12/14/2023     Assessment & Plan:    See Problem List for Assessment and Plan of chronic medical problems.

## 2024-01-25 ENCOUNTER — Ambulatory Visit: Admitting: Internal Medicine

## 2024-01-25 VITALS — BP 112/80 | HR 79 | Temp 98.1°F | Ht 64.0 in | Wt 166.0 lb

## 2024-01-25 DIAGNOSIS — F419 Anxiety disorder, unspecified: Secondary | ICD-10-CM

## 2024-01-25 DIAGNOSIS — E1159 Type 2 diabetes mellitus with other circulatory complications: Secondary | ICD-10-CM

## 2024-01-25 DIAGNOSIS — E1169 Type 2 diabetes mellitus with other specified complication: Secondary | ICD-10-CM | POA: Diagnosis not present

## 2024-01-25 DIAGNOSIS — Z7985 Long-term (current) use of injectable non-insulin antidiabetic drugs: Secondary | ICD-10-CM

## 2024-01-25 DIAGNOSIS — I152 Hypertension secondary to endocrine disorders: Secondary | ICD-10-CM

## 2024-01-25 DIAGNOSIS — F32A Depression, unspecified: Secondary | ICD-10-CM

## 2024-01-25 DIAGNOSIS — E785 Hyperlipidemia, unspecified: Secondary | ICD-10-CM

## 2024-01-25 NOTE — Assessment & Plan Note (Signed)
 Chronic Blood pressure well controlled Lightheadedness/dizziness improved with decreasing amlodipine  dose, but still having some Discontinue amlodipine  to 2.5 mg  Continue labetalol  400 mg 3 times daily, triamterene -HCTZ 37.5-25 mg daily Monitor BP at home

## 2024-01-25 NOTE — Assessment & Plan Note (Signed)
 Chronic Improved with medication change from Lexapro  to fluoxetine  Continue fluoxetine  20 mg daily She feels the above dose is adequate at this time-discussed that we can increase this if needed

## 2024-01-25 NOTE — Assessment & Plan Note (Signed)
 Chronic  Lab Results  Component Value Date   HGBA1C 5.5 12/14/2023   Sugars well controlled Discontinued metformin  at her last visit Continue Mounjaro  7.5 mg weekly Stressed regular exercise-not currently exercising regularly and stressed that she needs to be do this to help lose more weight which is her goal and maintain her weight

## 2024-02-12 ENCOUNTER — Encounter: Payer: Self-pay | Admitting: Internal Medicine

## 2024-02-13 ENCOUNTER — Other Ambulatory Visit: Payer: Self-pay

## 2024-02-13 MED ORDER — TIRZEPATIDE 7.5 MG/0.5ML ~~LOC~~ SOAJ: 7.5000 mg | SUBCUTANEOUS | 0 refills | Status: DC

## 2024-03-11 ENCOUNTER — Encounter: Payer: Self-pay | Admitting: Radiology

## 2024-04-08 ENCOUNTER — Encounter: Payer: Self-pay | Admitting: Internal Medicine

## 2024-04-08 NOTE — Patient Instructions (Signed)
 SABRA

## 2024-04-08 NOTE — Progress Notes (Unsigned)
 Virtual Visit via Video Note  I connected with Victoria JINNY Hebert on 04/08/24 at 11:00 AM EST by a video enabled telemedicine application and verified that I am speaking with the correct person using two identifiers.   I discussed the limitations of evaluation and management by telemedicine and the availability of in person appointments. The patient expressed understanding and agreed to proceed.  Present for the visit:  Myself, Dr Glade Hope, [name].  The patient is currently at home and I am in the office.    No referring provider.    History of Present Illness: She is here for an acute visit for cold symptoms.   Her symptoms started last week  She is experiencing fever, sweats, decreased appetite, nasal congestion, ear pain- intermittent, sinus pain, sore throat, cough that is productive, myalgias, headaches and dizziness.   She has tried taking otc cold medications.     Review of Systems  Constitutional:  Positive for diaphoresis and fever.       Dec appetite  HENT:  Positive for congestion, ear pain (intermittent), sinus pain and sore throat.   Respiratory:  Positive for cough and sputum production. Negative for shortness of breath.   Musculoskeletal:  Positive for myalgias (mild).  Neurological:  Positive for dizziness and headaches.      Social History   Socioeconomic History   Marital status: Married    Spouse name: Johnathon   Number of children: 1   Years of education: 14   Highest education level: Not on file  Occupational History   Occupation: [employer]  Tobacco Use   Smoking status: Former    Current packs/day: 0.00    Average packs/day: 0.3 packs/day for 8.0 years (2.0 ttl pk-yrs)    Types: Cigarettes    Start date: 05/09/1997    Quit date: 05/09/2005    Years since quitting: 18.9   Smokeless tobacco: Never   Tobacco comments:    Married, lives with spouse and son. 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  smoked  socially  off and on until she became pregnant.  Vaping Use   Vaping status: Every Day   Substances: Nicotine  Substance and Sexual Activity   Alcohol use: Yes    Comment: social   Drug use: No   Sexual activity: Not on file  Other Topics Concern   Not on file  Social History Narrative   Right handed    Coffee daily   Lives with husband and son. She also takes care of her mother who has Alzheimers   Social Drivers of Corporate Investment Banker Strain: Not on file  Food Insecurity: Not on file  Transportation Needs: Not on file  Physical Activity: Not on file  Stress: Not on file (03/15/2023)  Social Connections: Not on file     Observations/Objective: Appears well in NAD Coughing intermittently, breathing normally  Assessment and Plan:  See Problem List for Assessment and Plan of chronic medical problems.   Follow Up Instructions:    I discussed the assessment and treatment plan with the patient. The patient was provided an opportunity to ask questions and all were answered. The patient agreed with the plan and demonstrated an understanding of the instructions.   The patient was advised to call back or seek an in-person evaluation if the symptoms worsen or if the condition fails to improve as anticipated.    Glade JINNY Hope, MD

## 2024-04-09 ENCOUNTER — Telehealth: Admitting: Internal Medicine

## 2024-04-09 DIAGNOSIS — J019 Acute sinusitis, unspecified: Secondary | ICD-10-CM | POA: Diagnosis not present

## 2024-04-09 MED ORDER — AMOXICILLIN-POT CLAVULANATE 875-125 MG PO TABS: 1.0000 | ORAL_TABLET | Freq: Two times a day (BID) | ORAL | 0 refills | Status: AC

## 2024-04-09 MED ORDER — HYDROCODONE BIT-HOMATROP MBR 5-1.5 MG/5ML PO SOLN: 5.0000 mL | Freq: Three times a day (TID) | ORAL | 0 refills | Status: AC | PRN

## 2024-04-09 NOTE — Assessment & Plan Note (Addendum)
 Acute Likely bacterial  Start Augmentin  875-125 mg BID x 10 day, hycodan otc cold medications Rest, fluid Call if no improvement

## 2024-04-22 ENCOUNTER — Encounter: Payer: Self-pay | Admitting: Internal Medicine

## 2024-04-22 ENCOUNTER — Other Ambulatory Visit: Payer: Self-pay

## 2024-04-22 MED ORDER — TIRZEPATIDE 7.5 MG/0.5ML ~~LOC~~ SOAJ: 7.5000 mg | SUBCUTANEOUS | 0 refills | Status: DC

## 2024-05-28 ENCOUNTER — Other Ambulatory Visit: Payer: Self-pay | Admitting: Internal Medicine

## 2024-06-05 ENCOUNTER — Other Ambulatory Visit: Payer: Self-pay | Admitting: Internal Medicine

## 2024-08-01 ENCOUNTER — Encounter: Admitting: Internal Medicine
